# Patient Record
Sex: Male | Born: 1965 | Race: White | Hispanic: No | Marital: Single | Smoking: Current every day smoker
Health system: Southern US, Community
[De-identification: ages and names within clinical notes are randomized; demographics above are authoritative.]

## PROBLEM LIST (undated history)

## (undated) DIAGNOSIS — K859 Acute pancreatitis without necrosis or infection, unspecified: Secondary | ICD-10-CM

## (undated) DIAGNOSIS — I10 Essential (primary) hypertension: Secondary | ICD-10-CM

## (undated) DIAGNOSIS — F1010 Alcohol abuse, uncomplicated: Secondary | ICD-10-CM

## (undated) HISTORY — PX: NASAL SEPTUM SURGERY: SHX37

---

## 2019-10-10 ENCOUNTER — Other Ambulatory Visit: Payer: Self-pay

## 2019-10-10 ENCOUNTER — Encounter (HOSPITAL_COMMUNITY): Payer: Self-pay

## 2019-10-10 ENCOUNTER — Emergency Department (HOSPITAL_COMMUNITY)
Admission: EM | Admit: 2019-10-10 | Discharge: 2019-10-11 | Disposition: A | Discharge status: Home / Self Care | Payer: 59 | Attending: Emergency Medicine | Admitting: Emergency Medicine

## 2019-10-10 DIAGNOSIS — Z20822 Contact with and (suspected) exposure to covid-19: Secondary | ICD-10-CM | POA: Insufficient documentation

## 2019-10-10 DIAGNOSIS — F332 Major depressive disorder, recurrent severe without psychotic features: Secondary | ICD-10-CM | POA: Diagnosis not present

## 2019-10-10 DIAGNOSIS — F17210 Nicotine dependence, cigarettes, uncomplicated: Secondary | ICD-10-CM | POA: Diagnosis not present

## 2019-10-10 DIAGNOSIS — F1020 Alcohol dependence, uncomplicated: Secondary | ICD-10-CM | POA: Diagnosis present

## 2019-10-10 DIAGNOSIS — F1010 Alcohol abuse, uncomplicated: Secondary | ICD-10-CM

## 2019-10-10 NOTE — ED Provider Notes (Signed)
[place] DEPT Provider Note   CSN: [id number] Arrival date & time: 10/10/19  2123     History Chief Complaint  Patient presents with  . Wants Detox    Nicolas [name] is a 54 y.o. male.  The history is provided by the patient and medical records.    54 y.o. M with hx of alcohol abuse, presenting to the ED requesting detox.  Patient reports he is a former alcoholic, went through detox a few years ago and was doing well but relapsed on New Years Eve 2020.  States he has been drinking daily since that time, progressively worsening to the point where he is requiring more to drink on a daily basis.  States he absolutely cannot get back to the point that he was in before.  He reports where he is living is not a positive environment-- roommate is very controlling and also drinks quite a bit which is not a good influence for him.  He plans to move out in 1 month but states "a lot can happen in that time frame".  He is still attending [place] meetings but feels a lot of guilt for lying to his sponsor about his sobriety.  History reviewed. No pertinent past medical history.  There are no problems to display for this patient.   History reviewed. No pertinent surgical history.     No family history on file.  Social History   Tobacco Use  . Smoking status: Current Every Day Smoker    Packs/day: 0.50    Types: Cigarettes  . Smokeless tobacco: Never Used  Substance Use Topics  . Alcohol use: Yes    Alcohol/week: 6.0 standard drinks    Types: 6 Cans of beer per week  . Drug use: Not Currently    Types: Cocaine    Comment: stopped 01/2019    Home Medications Prior to Admission medications   Not on File    Allergies    Caffeine  Review of Systems   Review of Systems  Psychiatric/Behavioral:       Wants detox  All other systems reviewed and are negative.   Physical Exam Updated Vital Signs BP (!) 149/91 (BP Location: Right Arm)   Pulse 95    Temp 98.3 F (36.8 C) (Oral)   Resp 16   Ht 5\' 9"  (1.753 m)   Wt 80.7 kg   SpO2 96%   BMI 26.29 kg/m   Physical Exam Vitals and nursing note reviewed.  Constitutional:      Appearance: Nicolas [name] is well-developed.  HENT:     Head: Normocephalic and atraumatic.  Eyes:     Conjunctiva/sclera: Conjunctivae normal.     Pupils: Pupils are equal, round, and reactive to light.  Cardiovascular:     Rate and Rhythm: Normal rate and regular rhythm.     Heart sounds: Normal heart sounds.  Pulmonary:     Effort: Pulmonary effort is normal.     Breath sounds: Normal breath sounds.  Abdominal:     General: Bowel sounds are normal.     Palpations: Abdomen is soft.  Musculoskeletal:        General: Normal range of motion.     Cervical back: Normal range of motion.  Skin:    General: Skin is warm and dry.  Neurological:     Mental Status: Nicolas [name] is alert and oriented to person, place, and time.  Psychiatric:     Comments: Somewhat of an odd affect  ED Results / Procedures / Treatments   Labs (all labs ordered are listed, but only abnormal results are displayed) Labs Reviewed  CBC WITH DIFFERENTIAL/PLATELET - Abnormal; Notable for the following components:      Result Value   MCH 35.2 (*)    All other components within normal limits  COMPREHENSIVE METABOLIC PANEL - Abnormal; Notable for the following components:   Glucose, Bld 103 (*)    Calcium 8.7 (*)    AST 45 (*)    All other components within normal limits  ETHANOL - Abnormal; Notable for the following components:   Alcohol, Ethyl (B) 148 (*)    All other components within normal limits  SALICYLATE LEVEL - Abnormal; Notable for the following components:   Salicylate Lvl <7.0 (*)    All other components within normal limits  ACETAMINOPHEN LEVEL - Abnormal; Notable for the following components:   Acetaminophen (Tylenol), Serum <10 (*)    All other components within normal limits  RESPIRATORY PANEL BY RT PCR  (FLU A&B, COVID)  RAPID URINE DRUG SCREEN, HOSP PERFORMED    EKG None  Radiology No results found.  Procedures Procedures (including critical care time)  Medications Ordered in ED Medications - No data to display  ED Course  I have reviewed the triage vital signs and the nursing notes.  Pertinent labs & imaging results that were available during my care of the patient were reviewed by me and considered in my medical decision making (see chart for details).    MDM Rules/Calculators/A&P  11:36 PM Patient seen and evaluated.  Has history of alcohol abuse, progressively worsening drinking since New Year's Eve 2020.  States he is now at the point where he knows he needs help.  He is still attending AA and has some guilt from lying to his sponsor about his sobriety.  He denies any physical complaints currently.  Initially denied SI/HI/AVH.  After talking with patient and explaining that we do not offer IP detox here and that he would need to set this up as an OP, he got up from stretcher and stated "I swear to God, I have a knife in my backpack and I will slit my throat in the middle of this hallway if you send me away".  security was notified, belongings removed from patient and he will be wanded.  Labs ordered, will get psychiatric evaluation.  Labs reassuring-- ethanol 148.  Medically cleared.  Will get TTS consult.  1:52 AM TTS has evaluated-- recommended for overnight observation.  Can go to [facility] obs unit once COVID screen is negative.    covid screen negative.  Patient accepted to [facility] obs unit under care of Dr. Lucianne Muss.  Transport will be here to pick him up at approx 0630.  Final Clinical Impression(s) / ED Diagnoses Final diagnoses:  Alcohol abuse    Rx / DC Orders ED Discharge Orders    None       Garlon Hatchet, PA-C 10/11/19 0531    Glynn Octave, MD 10/11/19 [phone]

## 2019-10-10 NOTE — ED Notes (Signed)
Pt does not want blood drawn. Waiting to see if he had options

## 2019-10-10 NOTE — ED Triage Notes (Addendum)
Pt requests detox from alcohol. Stopped from October to January and has drank since. Pt states amount is increasing and he wants help before it gets bad again. Sts he drank a 6 pack today.

## 2019-10-11 ENCOUNTER — Other Ambulatory Visit: Payer: Self-pay

## 2019-10-11 ENCOUNTER — Ambulatory Visit (HOSPITAL_COMMUNITY): Payer: Self-pay

## 2019-10-11 ENCOUNTER — Observation Stay (HOSPITAL_COMMUNITY)
Admission: AD | Admit: 2019-10-11 | Discharge: 2019-10-11 | Disposition: A | Discharge status: Home / Self Care | Payer: 59 | Source: Intra-hospital | Attending: Psychiatry | Admitting: Psychiatry

## 2019-10-11 ENCOUNTER — Encounter (HOSPITAL_COMMUNITY): Payer: Self-pay | Admitting: Behavioral Health

## 2019-10-11 DIAGNOSIS — F329 Major depressive disorder, single episode, unspecified: Secondary | ICD-10-CM | POA: Diagnosis present

## 2019-10-11 LAB — ACETAMINOPHEN LEVEL: Acetaminophen (Tylenol), Serum: <10 ug/mL — ABNORMAL LOW (ref 10–30)

## 2019-10-11 LAB — RAPID URINE DRUG SCREEN, HOSP PERFORMED
Amphetamines: NOT DETECTED
Barbiturates: NOT DETECTED
Benzodiazepines: NOT DETECTED
Cocaine: NOT DETECTED
Opiates: NOT DETECTED
Tetrahydrocannabinol: NOT DETECTED

## 2019-10-11 LAB — COMPREHENSIVE METABOLIC PANEL
ALT: 31 U/L (ref 0–44)
AST: 45 U/L — ABNORMAL HIGH (ref 15–41)
Albumin: 4.5 g/dL (ref 3.5–5.0)
Alkaline Phosphatase: 74 U/L (ref 38–126)
Anion gap: 8 (ref 5–15)
BUN: 15 mg/dL (ref 6–20)
CO2: 26 mmol/L (ref 22–32)
Calcium: 8.7 mg/dL — ABNORMAL LOW (ref 8.9–10.3)
Chloride: 101 mmol/L (ref 98–111)
Creatinine, Ser: 0.86 mg/dL (ref 0.61–1.24)
GFR calc Af Amer: >60 mL/min (ref >60)
GFR calc non Af Amer: >60 mL/min (ref >60)
Glucose, Bld: 103 mg/dL — ABNORMAL HIGH (ref 70–99)
Potassium: 3.8 mmol/L (ref 3.5–5.1)
Sodium: 135 mmol/L (ref 135–145)
Total Bilirubin: 0.4 mg/dL (ref 0.3–1.2)
Total Protein: 7.1 g/dL (ref 6.5–8.1)

## 2019-10-11 LAB — CBC WITH DIFFERENTIAL/PLATELET
Abs Immature Granulocytes: 0.06 10*3/uL (ref 0.00–0.07)
Basophils Absolute: 0.1 10*3/uL (ref 0.0–0.1)
Basophils Relative: 1 %
Eosinophils Absolute: 0.2 10*3/uL (ref 0.0–0.5)
Eosinophils Relative: 3 %
HCT: 41.4 % (ref 39.0–52.0)
Hemoglobin: 14.9 g/dL (ref 13.0–17.0)
Immature Granulocytes: 1 %
Lymphocytes Relative: 43 %
Lymphs Abs: 3.4 10*3/uL (ref 0.7–4.0)
MCH: 35.2 pg — ABNORMAL HIGH (ref 26.0–34.0)
MCHC: 36 g/dL (ref 30.0–36.0)
MCV: 97.9 fL (ref 80.0–100.0)
Monocytes Absolute: 0.6 10*3/uL (ref 0.1–1.0)
Monocytes Relative: 8 %
Neutro Abs: 3.5 10*3/uL (ref 1.7–7.7)
Neutrophils Relative %: 44 %
Platelets: 253 10*3/uL (ref 150–400)
RBC: 4.23 MIL/uL (ref 4.22–5.81)
RDW: 11.9 % (ref 11.5–15.5)
WBC: 7.8 10*3/uL (ref 4.0–10.5)
nRBC: 0 % (ref 0.0–0.2)

## 2019-10-11 LAB — RESPIRATORY PANEL BY RT PCR (FLU A&B, COVID)
Influenza A by PCR: NEGATIVE
Influenza B by PCR: NEGATIVE
SARS Coronavirus 2 by RT PCR: NEGATIVE

## 2019-10-11 LAB — ETHANOL: Alcohol, Ethyl (B): 148 mg/dL — ABNORMAL HIGH (ref <10)

## 2019-10-11 LAB — SALICYLATE LEVEL: Salicylate Lvl: <7 mg/dL — ABNORMAL LOW (ref 7.0–30.0)

## 2019-10-11 MED ORDER — ACETAMINOPHEN 325 MG PO TABS: 650.0000 mg | ORAL_TABLET | Freq: Four times a day (QID) | ORAL | Status: DC | PRN

## 2019-10-11 MED ORDER — LORAZEPAM 1 MG PO TABS
1.0000 mg | ORAL_TABLET | Freq: Once | ORAL | Status: AC
  Administered 2019-10-11: 02:00:00 1 mg via ORAL
  Filled 2019-10-11: qty 1

## 2019-10-11 NOTE — ED Notes (Signed)
Safe transport contacted to transfer pt to [facility]: ETA 0630

## 2019-10-11 NOTE — Patient Outreach (Signed)
[facility] met with Pt an was able to gain information to better assist Pt. [facility] was able to complete series of questions to better assist Pt. Pt stated that he was doing much better because of the fact that he did not want to harm himself at the time. [facility] addressed the fact there are various option to better the quality of his life. Pt stated that he just wanted out Patient services. CPSS left contact information for Pt to contact [facility] if needed in community an to assist Pt with services.

## 2019-10-11 NOTE — Progress Notes (Signed)
Per [facility] pt tentatively accepted to [id number] pending negative covid. Call to report [phone]. Attending provider will be Dr. Lucianne Muss, MD.

## 2019-10-11 NOTE — [facility] Suicide Risk Assessment (Cosign Needed)
Suicide Risk Assessment  Discharge Assessment   [facility] Discharge Suicide Risk Assessment   Principal Problem: MDD (major depressive disorder) Discharge Diagnoses: Principal Problem:   MDD (major depressive disorder)  Per [facility] Progress Notes dated 10/11/2019:  Patient seen with Dr. Jama Flavors. Chart reviewed. Nicolas [name] is a 54 year old with history of alcohol use disorder who presented to [facility] yesterday requesting detox. He had been sober for 90 days but relapsed on New Years and has been drinking 4-8 beers almost daily since that time. He reports stressors of a bad living situation with a roommate who also drinks. He will be moving at the end of May but states he is struggling to get his drinking under control. He has been attending AA but states he has been lying to his sponsor about his drinking. He expresses fear that he will regress to his former levels of drinking when he was drinking a fifth per day. He reports withdrawal symptoms of insomnia, nausea, diarrhea, and chills. He denies history of severe withdrawal symptoms, seizures, or DTs. Denies medical problems. He denies SI/HI/AVH. He admits to history of SI when drinking but states, "I would never do that. I just feel bad when I drink." He is future-oriented and states he plans to sign the lease for his new apartment tomorrow. He shows no signs of paranoia or of responding to internal stimuli. No delusional thought content expressed. He is oriented x3. He is requesting detox and help for his alcohol use disorder.  From admission assessment: Nicolas [name] an 54 y.o.malewho presents to the ED voluntarily.Pt states he is depressed and feels helpless. Pt reports he has been living in a toxic environment and relapsed on alcohol 2 months ago. Pt states he moved to [place] from [place] because a friend told him that he could stay with her. Pt states ever since he moved in with her, she has been controlling. Pt states this causes him to want to drink and he  reports he had thoughts of buying a 5th of alcohol tonight in order to deal with her and the home.   Total Time spent with patient: 30 minutes  Musculoskeletal: Strength & Muscle Tone: within normal limits Gait & Station: normal Patient leans: N/A  Psychiatric Specialty Exam: Per [facility] Progress Note completed 10/11/2019 Physical Exam  Nursing note and vitals reviewed. Constitutional: Nicolas [name] is oriented to person, place, and time. Nicolas [name] appears well-developed and well-nourished.  Cardiovascular: Normal rate.  Respiratory: Effort normal.  Neurological: Nicolas [name] is alert and oriented to person, place, and time.    Review of Systems  Constitutional: Negative.   Respiratory: Negative for cough and shortness of breath.   Gastrointestinal: Positive for diarrhea and nausea. Negative for vomiting.  Neurological: Negative for tremors, seizures and headaches.  Psychiatric/Behavioral: Positive for dysphoric mood and sleep disturbance. Negative for agitation, behavioral problems, confusion, hallucinations, self-injury and suicidal ideas. The patient is not nervous/anxious and is not hyperactive.     Blood pressure 121/86, pulse 85, temperature 97.9 F (36.6 C), temperature source Oral, resp. rate 18, height 5\' 9"  (1.753 m), weight 85 kg, SpO2 96 %.Body mass index is 27.69 kg/m.  General Appearance: Disheveled  Eye Contact:  Fair  Speech:  Normal Rate  Volume:  Normal  Mood:  Dysphoric  Affect:  Congruent  Thought Process:  Coherent  Orientation:  Full (Time, Place, and Person)  Thought Content:  Logical  Suicidal Thoughts:  No  Homicidal Thoughts:  No  Memory:  Immediate;  Fair Recent;   Fair Remote;   Fair  Judgement:  Intact  Insight:  Fair  Psychomotor Activity:  Normal  Concentration:  Concentration: Fair and Attention Span: Fair  Recall:  [employer] of Knowledge:  Fair  Language:  Good  Akathisia:  No  Handed:  Right  AIMS (if indicated):     Assets:   Communication Skills Desire for Improvement Financial Resources/Insurance Housing Resilience Social Support  ADL's:  Intact  Cognition:  WNL  Sleep:        On Admission:  NA  Demographic Factors:  Male  Loss Factors: NA  Historical Factors: Impulsivity  Risk Reduction Factors:   Living with another person, especially a relative and Positive social support  Continued Clinical Symptoms:  Depression:   Impulsivity Alcohol/Substance Abuse/Dependencies  Cognitive Features That Contribute To Risk:  Closed-mindedness    Suicide Risk:  Mild:  Suicidal ideation of limited frequency, intensity, duration, and specificity.  There are no identifiable plans, no associated intent, mild dysphoria and related symptoms, good self-control (both objective and subjective assessment), few other risk factors, and identifiable protective factors, including available and accessible social support.    Plan Of Care/Follow-up recommendations:  Discussed with Dr. Parke Poisson. Patient poses no acute risk of harm to self or others and does not meet criteria for psychiatric inpatient hospitalization. Recommend referrals for detox and alcohol use disorder treatment.   Mallie Darting, NP 10/11/2019, 3:24 PM

## 2019-10-11 NOTE — Progress Notes (Signed)
[facility] MD Progress Note  10/11/2019 9:15 AM Nicolas [name]  MRN:  [id number] Subjective:  "I started to drink and I couldn't stop."  Patient seen with Dr. Jama Flavors. Chart reviewed. Mr. Nicolas [name] is a 54 year old with history of alcohol use disorder who presented to [facility] yesterday requesting detox. He had been sober for 90 days but relapsed on New Years and has been drinking 4-8 beers almost daily since that time. He reports stressors of a bad living situation with a roommate who also drinks. He will be moving at the end of May but states he is struggling to get his drinking under control. He has been attending AA but states he has been lying to his sponsor about his drinking. He expresses fear that he will regress to his former levels of drinking when he was drinking a fifth per day. He reports withdrawal symptoms of insomnia, nausea, diarrhea, and chills. He denies history of severe withdrawal symptoms, seizures, or DTs. Denies medical problems. He denies SI/HI/AVH. He admits to history of SI when drinking but states, "I would never do that. I just feel bad when I drink." He is future-oriented and states he plans to sign the lease for his new apartment tomorrow. He shows no signs of paranoia or of responding to internal stimuli. No delusional thought content expressed. He is oriented x3. He is requesting detox and help for his alcohol use disorder.  From admission assessment: Nicolas [name] is an 54 y.o. male who presents to the ED voluntarily. Pt states he is depressed and feels helpless. Pt reports he has been living in a toxic environment and relapsed on alcohol 2 months ago. Pt states he moved to [place] from [place] because a friend told him that he could stay with her. Pt states ever since he moved in with her, she has been controlling. Pt states this causes him to want to drink and he reports he had thoughts of buying a 5th of alcohol tonight in order to deal with her and the home.  Principal Problem: <principal  problem not specified> Diagnosis: Active Problems:   MDD (major depressive disorder)  Total Time spent with patient: 20 minutes  Past Psychiatric History: Alcohol use disorder.  Past Medical History: History reviewed. No pertinent past medical history. History reviewed. No pertinent surgical history. Family History: History reviewed. No pertinent family history. Family Psychiatric  History: Denies Social History:  Social History   Substance and Sexual Activity  Alcohol Use Yes  . Alcohol/week: 6.0 standard drinks  . Types: 6 Cans of beer per week     Social History   Substance and Sexual Activity  Drug Use Not Currently  . Types: Cocaine   Comment: stopped 01/2019    Social History   Socioeconomic History  . Marital status: Single    Spouse name: Not on file  . Number of children: Not on file  . Years of education: Not on file  . Highest education level: Not on file  Occupational History  . Not on file  Tobacco Use  . Smoking status: Current Every Day Smoker    Packs/day: 0.50    Types: Cigarettes  . Smokeless tobacco: Never Used  Substance and Sexual Activity  . Alcohol use: Yes    Alcohol/week: 6.0 standard drinks    Types: 6 Cans of beer per week  . Drug use: Not Currently    Types: Cocaine    Comment: stopped 01/2019  . Sexual activity: Not on file  Other Topics Concern  .  Not on file  Social History Narrative  . Not on file   Social Determinants of Health   Financial Resource Strain:   . Difficulty of Paying Living Expenses:   Food Insecurity:   . Worried About Programme researcher, broadcasting/film/videounning Out of Food in the Last Year:   . Baristaan Out of Food in the Last Year:   Transportation Needs:   . Freight forwarderLack of Transportation (Medical):   Marland Kitchen. Lack of Transportation (Non-Medical):   Physical Activity:   . Days of Exercise per Week:   . Minutes of Exercise per Session:   Stress:   . Feeling of Stress :   Social Connections:   . Frequency of Communication with Friends and Family:   .  Frequency of Social Gatherings with Friends and Family:   . Attends Religious Services:   . Active Member of Clubs or Organizations:   . Attends BankerClub or Organization Meetings:   Marland Kitchen. Marital Status:    Additional Social History:                         Sleep: Poor  Appetite:  Fair  Current Medications: Current Facility-Administered Medications  Medication Dose Route Frequency Provider Last Rate Last Admin  . acetaminophen (TYLENOL) tablet 650 mg  650 mg Oral Q6H PRN Anike, Adaku C, NP        Lab Results:  Results for orders placed or performed during the hospital encounter of 10/10/19 (from the past 48 hour(s))  CBC with Differential     Status: Abnormal   Collection Time: 10/10/19 11:39 PM  Result Value Ref Range   WBC 7.8 4.0 - 10.5 K/uL   RBC 4.23 4.22 - 5.81 MIL/uL   Hemoglobin 14.9 13.0 - 17.0 g/dL   HCT 16.141.4 09.639.0 - 04.552.0 %   MCV 97.9 80.0 - 100.0 fL   MCH 35.2 (H) 26.0 - 34.0 pg   MCHC 36.0 30.0 - 36.0 g/dL   RDW 40.911.9 81.111.5 - 91.415.5 %   Platelets 253 150 - 400 K/uL   nRBC 0.0 0.0 - 0.2 %   Neutrophils Relative % 44 %   Neutro Abs 3.5 1.7 - 7.7 K/uL   Lymphocytes Relative 43 %   Lymphs Abs 3.4 0.7 - 4.0 K/uL   Monocytes Relative 8 %   Monocytes Absolute 0.6 0.1 - 1.0 K/uL   Eosinophils Relative 3 %   Eosinophils Absolute 0.2 0.0 - 0.5 K/uL   Basophils Relative 1 %   Basophils Absolute 0.1 0.0 - 0.1 K/uL   Immature Granulocytes 1 %   Abs Immature Granulocytes 0.06 0.00 - 0.07 K/uL    Comment: Performed at [facility], [address] [place], [place] [id number]  Comprehensive metabolic panel     Status: Abnormal   Collection Time: 10/10/19 11:39 PM  Result Value Ref Range   Sodium 135 135 - 145 mmol/L   Potassium 3.8 3.5 - 5.1 mmol/L   Chloride 101 98 - 111 mmol/L   CO2 26 22 - 32 mmol/L   Glucose, Bld 103 (H) 70 - 99 mg/dL    Comment: Glucose reference range applies only to samples taken after fasting for at least 8 hours.   BUN 15 6  - 20 mg/dL   Creatinine, Ser 6.210.86 0.61 - 1.24 mg/dL   Calcium 8.7 (L) 8.9 - 10.3 mg/dL   Total Protein 7.1 6.5 - 8.1 g/dL   Albumin 4.5 3.5 - 5.0 g/dL   AST 45 (  H) 15 - 41 U/L   ALT 31 0 - 44 U/L   Alkaline Phosphatase 74 38 - 126 U/L   Total Bilirubin 0.4 0.3 - 1.2 mg/dL   GFR calc non Af Amer >60 >60 mL/min   GFR calc Af Amer >60 >60 mL/min   Anion gap 8 5 - 15    Comment: Performed at [facility], [place] [address], [place], [place] [address]  Ethanol     Status: Abnormal   Collection Time: 10/10/19 11:39 PM  Result Value Ref Range   Alcohol, Ethyl (B) 148 (H) <10 mg/dL    Comment: (NOTE) Lowest detectable limit for serum alcohol is 10 mg/dL. For medical purposes only. Performed at [facility], [place] [address], [place], [place] [address]   Salicylate level     Status: Abnormal   Collection Time: 10/10/19 11:39 PM  Result Value Ref Range   Salicylate Lvl <7.8 (L) 7.0 - 30.0 mg/dL    Comment: Performed at [facility], [place] [address] [place], [place] [address]  Acetaminophen level     Status: Abnormal   Collection Time: 10/10/19 11:39 PM  Result Value Ref Range   Acetaminophen (Tylenol), Serum <10 (L) 10 - 30 ug/mL    Comment: (NOTE) Therapeutic concentrations vary significantly. A range of 10-30 ug/mL  may be an effective concentration for many patients. However, some  are best treated at concentrations outside of this range. Acetaminophen concentrations >150 ug/mL at 4 hours after ingestion  and >50 ug/mL at 12 hours after ingestion are often associated with  toxic reactions. Performed at [facility], [place] [address] [place], [place] [address]   Rapid urine drug screen (hospital performed)     Status: None   Collection Time: 10/11/19 12:13 AM  Result Value Ref Range   Opiates NONE DETECTED NONE DETECTED   Cocaine NONE DETECTED NONE DETECTED   Benzodiazepines NONE DETECTED NONE DETECTED    Amphetamines NONE DETECTED NONE DETECTED   Tetrahydrocannabinol NONE DETECTED NONE DETECTED   Barbiturates NONE DETECTED NONE DETECTED    Comment: (NOTE) DRUG SCREEN FOR MEDICAL PURPOSES ONLY.  IF CONFIRMATION IS NEEDED FOR ANY PURPOSE, NOTIFY LAB WITHIN 5 DAYS. LOWEST DETECTABLE LIMITS FOR URINE DRUG SCREEN Drug Class                     Cutoff (ng/mL) Amphetamine and metabolites    1000 Barbiturate and metabolites    200 Benzodiazepine                 132 Tricyclics and metabolites     300 Opiates and metabolites        300 Cocaine and metabolites        300 THC                            50 Performed at [facility], [place] [address] [place], [place] [address]   Respiratory Panel by RT PCR (Flu A&B, Covid) - Nasopharyngeal Swab     Status: None   Collection Time: 10/11/19  1:52 AM   Specimen: Nasopharyngeal Swab  Result Value Ref Range   SARS Coronavirus 2 by RT PCR NEGATIVE NEGATIVE    Comment: (NOTE) SARS-CoV-2 target nucleic acids are NOT DETECTED. The SARS-CoV-2 RNA is generally detectable in upper respiratoy specimens during the acute phase of infection. The lowest concentration of SARS-CoV-2 viral copies this  assay can detect is 131 copies/mL. A negative result does not preclude SARS-Cov-2 infection and should not be used as the sole basis for treatment or other patient management decisions. A negative result may occur with  improper specimen collection/handling, submission of specimen other than nasopharyngeal swab, presence of viral mutation(s) within the areas targeted by this assay, and inadequate number of viral copies (<131 copies/mL). A negative result must be combined with clinical observations, patient history, and epidemiological information. The expected result is Negative. Fact Sheet for Patients:  https://www.moore.com/ Fact Sheet for Healthcare Providers:  https://www.young.biz/ This test is not  yet ap proved or cleared by the [place] FDA and  has been authorized for detection and/or diagnosis of SARS-CoV-2 by FDA under an Emergency Use Authorization (EUA). This EUA will remain  in effect (meaning this test can be used) for the duration of the COVID-19 declaration under Section 564(b)(1) of the Act, 21 U.S.C. section 360bbb-3(b)(1), unless the authorization is terminated or revoked sooner.    Influenza A by PCR NEGATIVE NEGATIVE   Influenza B by PCR NEGATIVE NEGATIVE    Comment: (NOTE) The Xpert Xpress SARS-CoV-2/FLU/RSV assay is intended as an aid in  the diagnosis of influenza from Nasopharyngeal swab specimens and  should not be used as a sole basis for treatment. Nasal washings and  aspirates are unacceptable for Xpert Xpress SARS-CoV-2/FLU/RSV  testing. Fact Sheet for Patients: https://www.moore.com/ Fact Sheet for Healthcare Providers: https://www.young.biz/ This test is not yet approved or cleared by the [place] FDA and  has been authorized for detection and/or diagnosis of SARS-CoV-2 by  FDA under an Emergency Use Authorization (EUA). This EUA will remain  in effect (meaning this test can be used) for the duration of the  Covid-19 declaration under Section 564(b)(1) of the Act, 21  U.S.C. section 360bbb-3(b)(1), unless the authorization is  terminated or revoked. Performed at [facility], [address], [place], [place] [address]     Blood Alcohol level:  Lab Results  Component Value Date   ETH 148 (H) 10/10/2019    Metabolic Disorder Labs: No results found for: HGBA1C, MPG No results found for: PROLACTIN No results found for: CHOL, TRIG, HDL, CHOLHDL, VLDL, LDLCALC  Physical Findings: AIMS: Facial and Oral Movements Muscles of Facial Expression: None, normal Lips and Perioral Area: None, normal Jaw: None, normal Tongue: None, normal,Extremity Movements Upper (arms, wrists, hands,  fingers): None, normal Lower (legs, knees, ankles, toes): None, normal, Trunk Movements Neck, shoulders, hips: None, normal, Overall Severity Severity of abnormal movements (highest score from questions above): None, normal Incapacitation due to abnormal movements: None, normal Patient's awareness of abnormal movements (rate only patient's report): No Awareness, Dental Status Current problems with teeth and/or dentures?: No Does patient usually wear dentures?: No  CIWA:    COWS:     Psychiatric Specialty Exam: Physical Exam  Nursing note and vitals reviewed. Constitutional: Abdelrahman Nair is oriented to person, place, and time. Renell Allum appears well-developed and well-nourished.  Cardiovascular: Normal rate.  Respiratory: Effort normal.  Neurological: Maximillian Habibi is alert and oriented to person, place, and time.    Review of Systems  Constitutional: Negative.   Respiratory: Negative for cough and shortness of breath.   Gastrointestinal: Positive for diarrhea and nausea. Negative for vomiting.  Neurological: Negative for tremors, seizures and headaches.  Psychiatric/Behavioral: Positive for dysphoric mood and sleep disturbance. Negative for agitation, behavioral problems, confusion, hallucinations, self-injury and suicidal ideas. The patient is not nervous/anxious and is not  hyperactive.     Blood pressure 121/86, pulse 85, temperature 97.9 F (36.6 C), temperature source Oral, resp. rate 18, height 5\' 9"  (1.753 m), weight 85 kg, SpO2 96 %.Body mass index is 27.69 kg/m.  General Appearance: Disheveled  Eye Contact:  Fair  Speech:  Normal Rate  Volume:  Normal  Mood:  Dysphoric  Affect:  Congruent  Thought Process:  Coherent  Orientation:  Full (Time, Place, and Person)  Thought Content:  Logical  Suicidal Thoughts:  No  Homicidal Thoughts:  No  Memory:  Immediate;   Fair Recent;   Fair Remote;   Fair  Judgement:  Intact  Insight:  Fair  Psychomotor Activity:  Normal   Concentration:  Concentration: Fair and Attention Span: Fair  Recall:  of Knowledge:  Fair  Language:  Good  Akathisia:  No  Handed:  Right  AIMS (if indicated):     Assets:  Communication Skills Desire for Improvement Financial Resources/Insurance Housing Resilience Social Support  ADL's:  Intact  Cognition:  WNL  Sleep:        Disposition: Discussed with Dr. [employer]erv. Patient poses no acute risk of harm to self or others and does not meet criteria for psychiatric inpatient hospitalization. Recommend referrals for detox and alcohol use disorder treatment.  Jama Flavors, NP 10/11/2019, 9:15 AM

## 2019-10-11 NOTE — Progress Notes (Signed)
Patient discharged per MD order. Discharge instructions provided an patient verbalized understanding. Denied suicidal thoughts. Belongings returned to patient. No sign of distress upon discharge.

## 2019-10-11 NOTE — Discharge Instructions (Signed)
To help you maintain a sober lifestyle, a substance abuse treatment program may be beneficial to you.  Contact one of the following facilities at your earliest opportunity to ask about enrolling:  RESIDENTIAL PROGRAMS:       [facility]      [address]      [place], [place] [address]      [phone]       [facility] [place]      [address] [place] [address]      [phone]       [facility]      [address]      [place], [place] [address]      [phone]  CHEMICAL DEPENDENCY INTENSIVE OUTPATIENT PROGRAMS:       [facility]      [address]      [place], [place] [address]      [phone]       [facility]      [address]      [place],  [address]      [phone]  

## 2019-10-11 NOTE — BH Assessment (Signed)
Tele Assessment Note   Patient Name: Nicolas [name] MRN: [id number] Referring Physician: Garlon Hatchet, PA-C Location of Patient: [place] Location of Provider: Behavioral Health TTS Department  Nicolas [name] is an 54 y.o. male who presents to the ED voluntarily. Pt states he is depressed and feels helpless. Pt reports he has been living in a toxic environment and relapsed on alcohol 2 months ago. Pt states he moved to [place] from [place] because a friend told him that he could stay with her. Pt states ever since he moved in with her, she has been controlling. Pt states this causes him to want to drink and he reports he had thoughts of buying a 5th of alcohol tonight in order to deal with her and the home. Pt states this is when he knew he needed help and he went to [facility] to ask for help. Pt endorses feelings of hopelessness, depression, sad mood, difficulty sleeping, and irritability. Pt states he attends AA meetings daily, talks with his sponsors, and continues to feel depressed and consume large amounts of alcohol. Pt states he "hates life" and is sometimes "scared to flush the toilet at night because it's so loud." Pt states he is afraid because 2 friends from his group killed themselves and his sponsor continues to tell him that if he does not get help, he is going to die.   Pt is alert and oriented during the assessment. Pt is irritable and depressed. Pt does not appear to be responding to internal stimuli. Pt's judgement is partial. Pt's insight is fair. Pt's appetite is poor and reports he has loss of appetite and decreased sleep due to stress.  Per Renaye Rakers, NP pt is recommended for overnight OBS for safety and stabilization and to be reassessed in the AM by psych. EDP Garlon Hatchet, PA-C and [place], [place], RN have been advised. Pt has an OBS bed at [facility] pending negative Covid  Diagnosis: MDD, recurrent, severe, w/o psychosis; Alcohol use d/o, severe  Past Medical History: History reviewed. No  pertinent past medical history.  History reviewed. No pertinent surgical history.  Family History: No family history on file.  Social History:  reports that he has been smoking cigarettes. He has been smoking about 0.50 packs per day. He has never used smokeless tobacco. He reports current alcohol use of about 6.0 standard drinks of alcohol per week. He reports previous drug use. Drug: Cocaine.  Additional Social History:  Alcohol / Drug Use Pain Medications: See MAR Prescriptions: See MAR Over the Counter: See MAR History of alcohol / drug use?: Yes Longest period of sobriety (when/how long): 3 months Negative Consequences of Use: Personal relationships Withdrawal Symptoms: Irritability, Sweats Substance #1 Name of Substance 1: Alcohol 1 - Age of First Use: teens 1 - Amount (size/oz): excessive 1 - Frequency: daily 1 - Duration: ongoing 1 - Last Use / Amount: 10/10/2019  CIWA: CIWA-Ar BP: (!) 149/91 Pulse Rate: 95 COWS:    Allergies:  Allergies  Allergen Reactions  . Caffeine Palpitations    Home Medications: (Not in a hospital admission)   OB/GYN Status:  No LMP for male patient.  General Assessment Data Location of Assessment: [facility] TTS Assessment: In system Is this a Tele or Face-to-Face Assessment?: Tele Assessment Is this an Initial Assessment or a Re-assessment for this encounter?: Initial Assessment Patient Accompanied by:: N/A Language Other than English: No Living Arrangements: Other (Comment) What gender do you identify as?: Male Marital status: Single Pregnancy Status: No Living  Arrangements: Non-relatives/Friends Can pt return to current living arrangement?: (pt does not wish to return) Admission Status: Voluntary Is patient capable of signing voluntary admission?: Yes Referral Source: Self/Family/Friend Insurance type: BRIGHT HEALTH / BRIGHT HEALTH     Crisis Care Plan Living Arrangements: Non-relatives/Friends Name of Psychiatrist:  none Name of Therapist: AA meetings  Education Status Is patient currently in school?: No Is the patient employed, unemployed or receiving disability?: Unemployed  Risk to self with the past 6 months Suicidal Ideation: Yes-Currently Present Has patient been a risk to self within the past 6 months prior to admission? : No Suicidal Intent: No Has patient had any suicidal intent within the past 6 months prior to admission? : No Is patient at risk for suicide?: Yes Suicidal Plan?: No Has patient had any suicidal plan within the past 6 months prior to admission? : No Access to Means: No What has been your use of drugs/alcohol within the last 12 months?: alcohol Previous Attempts/Gestures: No Other Self Harm Risks: substance abuse, depression Triggers for Past Attempts: None known Intentional Self Injurious Behavior: None Family Suicide History: No Recent stressful life event(s): Conflict (Comment), Other (Comment)(conflict with living situation, relapsed on alcohol) Persecutory voices/beliefs?: No Depression: Yes Depression Symptoms: Despondent, Insomnia, Loss of interest in usual pleasures, Feeling worthless/self pity, Feeling angry/irritable Substance abuse history and/or treatment for substance abuse?: Yes Suicide prevention information given to non-admitted patients: Not applicable  Risk to Others within the past 6 months Homicidal Ideation: No Does patient have any lifetime risk of violence toward others beyond the six months prior to admission? : No Thoughts of Harm to Others: No Current Homicidal Intent: No Current Homicidal Plan: No Access to Homicidal Means: No History of harm to others?: No Assessment of Violence: None Noted Does patient have access to weapons?: No Criminal Charges Pending?: No Does patient have a court date: No Is patient on probation?: No  Psychosis Hallucinations: None noted Delusions: None noted  Mental Status Report Appearance/Hygiene:  Unremarkable Eye Contact: Good Motor Activity: Freedom of movement Speech: Logical/coherent Level of Consciousness: Alert, Irritable Mood: Depressed, Helpless, Anxious Affect: Depressed, Sad, Anxious Anxiety Level: Severe Thought Processes: Relevant, Coherent Judgement: Partial Orientation: Person, Place, Time, Situation, Appropriate for developmental age Obsessive Compulsive Thoughts/Behaviors: None  Cognitive Functioning Concentration: Normal Memory: Remote Intact, Recent Intact Is patient IDD: No Insight: Fair Impulse Control: Poor Appetite: Fair Have you had any weight changes? : No Change Sleep: Decreased Total Hours of Sleep: 4 Vegetative Symptoms: None  ADLScreening [facility] Dt Assessment Services) Patient's cognitive ability adequate to safely complete daily activities?: Yes Patient able to express need for assistance with ADLs?: Yes Independently performs ADLs?: Yes (appropriate for developmental age)  Prior Inpatient Therapy Prior Inpatient Therapy: Yes Prior Therapy Dates: 2020 Prior Therapy Facilty/Provider(s): [facility] Reason for Treatment: substance abuse tx  Prior Outpatient Therapy Prior Outpatient Therapy: Yes Prior Therapy Dates: ongoing Prior Therapy Facilty/Provider(s): [facility] Reason for Treatment: substance abuse tx Does patient have an ACCT team?: No Does patient have Intensive In-House Services?  : No Does patient have [facility] services? : No Does patient have P4CC services?: No  ADL Screening (condition at time of admission) Patient's cognitive ability adequate to safely complete daily activities?: Yes Is the patient deaf or have difficulty hearing?: No Does the patient have difficulty seeing, even when wearing glasses/contacts?: No Does the patient have difficulty concentrating, remembering, or making decisions?: No Patient able to express need for assistance with ADLs?: Yes Does the patient have difficulty  dressing or  bathing?: No Independently performs ADLs?: Yes (appropriate for developmental age) Does the patient have difficulty walking or climbing stairs?: No Weakness of Legs: None Weakness of Arms/Hands: None  Home Assistive Devices/Equipment Home Assistive Devices/Equipment: None    Abuse/Neglect Assessment (Assessment to be complete while patient is alone) Abuse/Neglect Assessment Can Be Completed: Unable to assess, patient is non-responsive or altered mental status(pt states he does not want to talk about it)     Advance Directives (For Healthcare) Does Patient Have a Medical Advance Directive?: No Would patient like information on creating a medical advance directive?: No - Patient declined         Per Talbot Grumbling, NP pt is recommended for overnight OBS for safety and stabilization and to be reassessed in the AM by psych. EDP Larene Pickett, PA-C and [employer] [facility] [employer], [place], RN have been advised. Pt has an OBS bed at [facility] pending negative Covid Disposition:  Disposition Initial Assessment Completed for this Encounter: Yes Disposition of Patient: (overnight obs) Patient refused recommended treatment: No  This service was provided via telemedicine using a 2-way, interactive audio and video technology.  Names of all persons participating in this telemedicine service and their role in this encounter. Name: Nicolas [name] Role: Patient  Name: Lind Covert, LCSW Role: TTS  Name: Talbot Grumbling, NP Role: [facility] Provider       Lyanne Co 10/11/2019 2:45 AM

## 2019-10-11 NOTE — BH Assessment (Signed)
[facility] Assessment Progress Note  Per Fernando Cobos, MD, this pt does not require psychiatric hospitalization at this time.  Pt is to be discharged from the [facility] Observation Unit with referral information for area substance abuse treatment providers.  This has been included in pt's discharge instructions.  Pt would also benefit from seeing Peer Support Specialists, and a peer support consult has been ordered for pt.  Pt's nurses, [name] and Veronique, have been notified.  Gayathri Futrell, MA Triage Specialist [phone]     

## 2019-10-11 NOTE — Progress Notes (Signed)
Per Renaye Rakers, NP pt is recommended for overnight OBS for safety and stabilization and to be reassessed in the AM by psych. EDP Garlon Hatchet, PA-C and [employer], [place], RN have been advised. Pt has an OBS bed at [facility] pending negative Covid

## 2019-10-11 NOTE — Plan of Care (Signed)
[facility] Observation Crisis Plan  Reason for Crisis Plan:  Crisis Stabilization and Substance Abuse   Plan of Care:  Referral for Substance Abuse  Family Support:      Current Living Environment:  Living Arrangements: Non-relatives/Friends  Insurance:   Hospital Account    Name Acct ID Class Status Primary Coverage   Nicolas [name], Nicolas [name] [id number] BEHAVIORAL HEALTH OBSERVATION Open BRIGHT HEALTH  - BRIGHT HEALTH        Guarantor Account (for Hospital Account [id number])    Name Relation to Pt Service Area Active? Acct Type   Nicolas [name] Self [facility] Yes Behavioral Health   Address Phone       [address] [place], [place] [address] [phone](H)          Coverage Information (for Hospital Account [id number])    F/O Payor/Plan Precert #   [facility]    Subscriber Subscriber #   Nicolas, [name] [id number]   Address Phone   [address] [address] [place], [place] [address]       Legal Guardian:     Primary Care Provider:  Clayborn Heron, MD  Current Outpatient Providers:  None  Psychiatrist:     Counselor/Therapist:     Compliant with Medications:  Yes  Additional Information:   Nicolas [name] 4/30/20217:38 AM

## 2019-10-15 ENCOUNTER — Emergency Department (HOSPITAL_COMMUNITY)
Admission: EM | Admit: 2019-10-15 | Discharge: 2019-10-16 | Disposition: A | Discharge status: Home / Self Care | Payer: 59 | Attending: Emergency Medicine | Admitting: Emergency Medicine

## 2019-10-15 ENCOUNTER — Other Ambulatory Visit: Payer: Self-pay

## 2019-10-15 ENCOUNTER — Encounter (HOSPITAL_COMMUNITY): Payer: Self-pay | Admitting: Emergency Medicine

## 2019-10-15 DIAGNOSIS — Z79899 Other long term (current) drug therapy: Secondary | ICD-10-CM | POA: Insufficient documentation

## 2019-10-15 DIAGNOSIS — F1010 Alcohol abuse, uncomplicated: Secondary | ICD-10-CM

## 2019-10-15 DIAGNOSIS — F1024 Alcohol dependence with alcohol-induced mood disorder: Secondary | ICD-10-CM | POA: Insufficient documentation

## 2019-10-15 DIAGNOSIS — F1020 Alcohol dependence, uncomplicated: Secondary | ICD-10-CM | POA: Diagnosis present

## 2019-10-15 DIAGNOSIS — F329 Major depressive disorder, single episode, unspecified: Secondary | ICD-10-CM

## 2019-10-15 DIAGNOSIS — R142 Eructation: Secondary | ICD-10-CM | POA: Insufficient documentation

## 2019-10-15 DIAGNOSIS — F17210 Nicotine dependence, cigarettes, uncomplicated: Secondary | ICD-10-CM | POA: Insufficient documentation

## 2019-10-15 DIAGNOSIS — F10229 Alcohol dependence with intoxication, unspecified: Secondary | ICD-10-CM | POA: Diagnosis present

## 2019-10-15 DIAGNOSIS — R109 Unspecified abdominal pain: Secondary | ICD-10-CM | POA: Diagnosis present

## 2019-10-15 DIAGNOSIS — F32A Depression, unspecified: Secondary | ICD-10-CM

## 2019-10-15 LAB — CBC
HCT: 41.7 % (ref 39.0–52.0)
Hemoglobin: 14.5 g/dL (ref 13.0–17.0)
MCH: 34 pg (ref 26.0–34.0)
MCHC: 34.8 g/dL (ref 30.0–36.0)
MCV: 97.9 fL (ref 80.0–100.0)
Platelets: 240 10*3/uL (ref 150–400)
RBC: 4.26 MIL/uL (ref 4.22–5.81)
RDW: 12 % (ref 11.5–15.5)
WBC: 6.1 10*3/uL (ref 4.0–10.5)
nRBC: 0 % (ref 0.0–0.2)

## 2019-10-15 LAB — URINALYSIS, ROUTINE W REFLEX MICROSCOPIC
Bilirubin Urine: NEGATIVE
Glucose, UA: NEGATIVE mg/dL
Hgb urine dipstick: NEGATIVE
Ketones, ur: NEGATIVE mg/dL
Leukocytes,Ua: NEGATIVE
Nitrite: NEGATIVE
Protein, ur: NEGATIVE mg/dL
Specific Gravity, Urine: 1.002 — ABNORMAL LOW (ref 1.005–1.030)
pH: 7 (ref 5.0–8.0)

## 2019-10-15 LAB — COMPREHENSIVE METABOLIC PANEL
ALT: 42 U/L (ref 0–44)
AST: 52 U/L — ABNORMAL HIGH (ref 15–41)
Albumin: 4.1 g/dL (ref 3.5–5.0)
Alkaline Phosphatase: 63 U/L (ref 38–126)
Anion gap: 10 (ref 5–15)
BUN: 9 mg/dL (ref 6–20)
CO2: 28 mmol/L (ref 22–32)
Calcium: 10.1 mg/dL (ref 8.9–10.3)
Chloride: 102 mmol/L (ref 98–111)
Creatinine, Ser: 0.76 mg/dL (ref 0.61–1.24)
GFR calc Af Amer: >60 mL/min (ref >60)
GFR calc non Af Amer: >60 mL/min (ref >60)
Glucose, Bld: 118 mg/dL — ABNORMAL HIGH (ref 70–99)
Potassium: 3.4 mmol/L — ABNORMAL LOW (ref 3.5–5.1)
Sodium: 140 mmol/L (ref 135–145)
Total Bilirubin: 0.9 mg/dL (ref 0.3–1.2)
Total Protein: 6.8 g/dL (ref 6.5–8.1)

## 2019-10-15 LAB — LIPASE, BLOOD: Lipase: 20 U/L (ref 11–51)

## 2019-10-15 LAB — RAPID URINE DRUG SCREEN, HOSP PERFORMED
Amphetamines: NOT DETECTED
Barbiturates: NOT DETECTED
Benzodiazepines: NOT DETECTED
Cocaine: NOT DETECTED
Opiates: NOT DETECTED
Tetrahydrocannabinol: NOT DETECTED

## 2019-10-15 LAB — ETHANOL: Alcohol, Ethyl (B): 241 mg/dL — ABNORMAL HIGH (ref <10)

## 2019-10-15 MED ORDER — CHLORPROMAZINE HCL 25 MG PO TABS
25.0000 mg | ORAL_TABLET | Freq: Once | ORAL | Status: AC
  Administered 2019-10-15: 25 mg via ORAL
  Filled 2019-10-15: qty 1

## 2019-10-15 MED ORDER — ATENOLOL 25 MG PO TABS
25.0000 mg | ORAL_TABLET | Freq: Every day | ORAL | Status: DC
  Administered 2019-10-15 – 2019-10-16 (×2): 25 mg via ORAL
  Filled 2019-10-15 (×2): qty 1

## 2019-10-15 MED ORDER — LORAZEPAM 1 MG PO TABS: 0.0000 mg | ORAL_TABLET | Freq: Two times a day (BID) | ORAL | Status: DC

## 2019-10-15 MED ORDER — THIAMINE HCL 100 MG PO TABS
100.0000 mg | ORAL_TABLET | Freq: Once | ORAL | Status: AC
  Administered 2019-10-15: 100 mg via ORAL
  Filled 2019-10-15: qty 1

## 2019-10-15 MED ORDER — HYDROXYZINE HCL 25 MG PO TABS
25.0000 mg | ORAL_TABLET | Freq: Every evening | ORAL | Status: DC | PRN
  Administered 2019-10-16: 05:00:00 25 mg via ORAL
  Filled 2019-10-15 (×2): qty 1

## 2019-10-15 MED ORDER — LORAZEPAM 2 MG/ML IJ SOLN: 0.0000 mg | Freq: Four times a day (QID) | INTRAMUSCULAR | Status: DC

## 2019-10-15 MED ORDER — FOLIC ACID 1 MG PO TABS
1.0000 mg | ORAL_TABLET | Freq: Once | ORAL | Status: AC
  Administered 2019-10-15: 1 mg via ORAL
  Filled 2019-10-15: qty 1

## 2019-10-15 MED ORDER — ONDANSETRON HCL 4 MG PO TABS
4.0000 mg | ORAL_TABLET | Freq: Three times a day (TID) | ORAL | Status: DC | PRN
  Administered 2019-10-16: 4 mg via ORAL
  Filled 2019-10-15: qty 1

## 2019-10-15 MED ORDER — LORAZEPAM 1 MG PO TABS
0.0000 mg | ORAL_TABLET | Freq: Four times a day (QID) | ORAL | Status: DC
  Administered 2019-10-16 (×2): 1 mg via ORAL
  Filled 2019-10-15 (×2): qty 1

## 2019-10-15 MED ORDER — SODIUM CHLORIDE 0.9% FLUSH
3.0000 mL | Freq: Once | INTRAVENOUS | Status: AC
  Administered 2019-10-15: 3 mL via INTRAVENOUS

## 2019-10-15 MED ORDER — THIAMINE HCL 100 MG PO TABS
100.0000 mg | ORAL_TABLET | Freq: Every day | ORAL | Status: DC
  Administered 2019-10-15 – 2019-10-16 (×2): 100 mg via ORAL
  Filled 2019-10-15 (×2): qty 1

## 2019-10-15 MED ORDER — LACTATED RINGERS IV BOLUS
1000.0000 mL | Freq: Once | INTRAVENOUS | Status: AC
  Administered 2019-10-15: 1000 mL via INTRAVENOUS

## 2019-10-15 MED ORDER — LORAZEPAM 2 MG/ML IJ SOLN: 0.0000 mg | Freq: Two times a day (BID) | INTRAMUSCULAR | Status: DC

## 2019-10-15 MED ORDER — SPIRONOLACTONE 25 MG PO TABS
50.0000 mg | ORAL_TABLET | Freq: Two times a day (BID) | ORAL | Status: DC
  Administered 2019-10-15 – 2019-10-16 (×2): 50 mg via ORAL
  Filled 2019-10-15 (×2): qty 2

## 2019-10-15 MED ORDER — THIAMINE HCL 100 MG/ML IJ SOLN: 100.0000 mg | Freq: Every day | INTRAMUSCULAR | Status: DC

## 2019-10-15 MED ORDER — ADULT MULTIVITAMIN W/MINERALS CH
1.0000 | ORAL_TABLET | Freq: Once | ORAL | Status: AC
  Administered 2019-10-15: 1 via ORAL
  Filled 2019-10-15: qty 1

## 2019-10-15 MED ORDER — LORAZEPAM 2 MG/ML IJ SOLN
1.0000 mg | Freq: Once | INTRAMUSCULAR | Status: AC
  Administered 2019-10-15: 1 mg via INTRAVENOUS
  Filled 2019-10-15: qty 1

## 2019-10-15 MED ORDER — IBUPROFEN 200 MG PO TABS
600.0000 mg | ORAL_TABLET | Freq: Three times a day (TID) | ORAL | Status: DC | PRN
  Administered 2019-10-16: 600 mg via ORAL
  Filled 2019-10-15: qty 3

## 2019-10-15 MED ORDER — ALUM & MAG HYDROXIDE-SIMETH 200-200-20 MG/5ML PO SUSP
30.0000 mL | Freq: Four times a day (QID) | ORAL | Status: DC | PRN
  Administered 2019-10-16 (×2): 30 mL via ORAL
  Filled 2019-10-15 (×3): qty 30

## 2019-10-15 NOTE — ED Notes (Signed)
TTS cart at beside.

## 2019-10-15 NOTE — ED Triage Notes (Signed)
Pt reports that having abd pains and belching. 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  Pt reports is living with a person from [place] that he met.

## 2019-10-15 NOTE — ED Notes (Signed)
TTS provider on screen speaking with pt at this time.

## 2019-10-15 NOTE — ED Provider Notes (Signed)
[place] DEPT Provider Note   CSN: [id number] Arrival date & time: 10/15/19  1529     History Chief Complaint  Patient presents with  . Abdominal Pain  . belching    Nicolas [name] is a 54 y.o. adult.  HPI   82yM with etoh abuse. Complaining of many issue related to this. Doesn't feel well. Diet is poor. Affecting relationship with roommate. Says he is in [place] but has been lying to his sponsor recently. Feels hopeless at times but doesn't want to harm himself or anyone else. Last drank today. Belching frequently through the day today. No abdominal pain.  History reviewed. No pertinent past medical history.  Patient Active Problem List   Diagnosis Date Noted  . MDD (major depressive disorder) 10/11/2019    History reviewed. No pertinent surgical history.    No family history on file.  Social History   Tobacco Use  . Smoking status: Current Every Day Smoker    Packs/day: 0.50    Types: Cigarettes  . Smokeless tobacco: Never Used  Substance Use Topics  . Alcohol use: Yes    Alcohol/week: 6.0 standard drinks    Types: 6 Cans of beer per week  . Drug use: Not Currently    Types: Cocaine    Comment: stopped 01/2019    Home Medications Prior to Admission medications   Medication Sig Start Date End Date Taking? Authorizing Provider  atenolol (TENORMIN) 25 MG tablet Take 25 mg by mouth daily. 08/31/19   [provider]  estradiol (CLIMARA - DOSED IN MG/24 HR) 0.1 mg/24hr patch Place 0.1 mg onto the skin once a week. 10/06/19   [provider]  hydrOXYzine (ATARAX/VISTARIL) 25 MG tablet Take 25 mg by mouth at bedtime as needed for anxiety (sleep).  06/27/19   [provider]  spironolactone (ALDACTONE) 50 MG tablet Take 50 mg by mouth 2 (two) times daily. 08/22/19   [provider]    Allergies    Caffeine  Review of Systems   Review of Systems All systems reviewed and negative, other than as noted in  HPI.  Physical Exam Updated Vital Signs BP 126/83   Pulse 74   Temp 99.5 F (37.5 C)   Resp 19   SpO2 93%   Physical Exam Vitals and nursing note reviewed.  Constitutional:      General: Nicolas [name] is not in acute distress.    Appearance: Nicolas [name] is well-developed.  HENT:     Head: Normocephalic and atraumatic.  Eyes:     General:        Right eye: No discharge.        Left eye: No discharge.     Conjunctiva/sclera: Conjunctivae normal.  Cardiovascular:     Rate and Rhythm: Normal rate and regular rhythm.     Heart sounds: Normal heart sounds. No murmur. No friction rub. No gallop.   Pulmonary:     Effort: Pulmonary effort is normal. No respiratory distress.     Breath sounds: Normal breath sounds.  Abdominal:     General: There is no distension.     Palpations: Abdomen is soft.     Tenderness: There is no abdominal tenderness.  Musculoskeletal:        General: No tenderness.     Cervical back: Neck supple.  Skin:    General: Skin is warm and dry.  Neurological:     Mental Status: Nicolas [name] is alert.  Psychiatric:  Comments: Emotionally labile     ED Results / Procedures / Treatments   Labs (all labs ordered are listed, but only abnormal results are displayed) Labs Reviewed  COMPREHENSIVE METABOLIC PANEL - Abnormal; Notable for the following components:      Result Value   Potassium 3.4 (*)    Glucose, Bld 118 (*)    AST 52 (*)    All other components within normal limits  URINALYSIS, ROUTINE W REFLEX MICROSCOPIC - Abnormal; Notable for the following components:   Color, Urine COLORLESS (*)    Specific Gravity, Urine 1.002 (*)    All other components within normal limits  ETHANOL - Abnormal; Notable for the following components:   Alcohol, Ethyl (B) 241 (*)    All other components within normal limits  LIPASE, BLOOD  CBC  RAPID URINE DRUG SCREEN, HOSP PERFORMED    EKG EKG Interpretation  Date/Time:  Tuesday Oct 15 2019 18:18:11  EDT Ventricular Rate:  72 PR Interval:    QRS Duration: 111 QT Interval:  396 QTC Calculation: 434 R Axis:   -27 Text Interpretation: Sinus rhythm Borderline left axis deviation Confirmed by Raeford Razor [phone]) on 10/15/2019 6:53:22 PM   Radiology No results found.  Procedures Procedures (including critical care time)  Medications Ordered in ED Medications  sodium chloride flush (NS) 0.9 % injection 3 mL (has no administration in time range)  lactated ringers bolus 1,000 mL (1,000 mLs Intravenous New Bag/Given 10/15/19 1822)  thiamine tablet 100 mg (100 mg Oral Given 10/15/19 1814)  LORazepam (ATIVAN) injection 1 mg (1 mg Intravenous Given 10/15/19 1816)  chlorproMAZINE (THORAZINE) tablet 25 mg (25 mg Oral Given 10/15/19 1814)  folic acid (FOLVITE) tablet 1 mg (1 mg Oral Given 10/15/19 1814)  multivitamin with minerals tablet 1 tablet (1 tablet Oral Given 10/15/19 1814)    ED Course  I have reviewed the triage vital signs and the nursing notes.  Pertinent labs & imaging results that were available during my care of the patient were reviewed by me and considered in my medical decision making (see chart for details).    MDM Rules/Calculators/A&P                      53yM with ETOH abuse and numerous complaints related to it. I doubt emergent issue. Reports feeling of hopelessness. Denies wanting to harm himself or anyone else though.  Will medically screen for TTS evaluation.  Ethanol elevated. He is medically cleared though.   Final Clinical Impression(s) / ED Diagnoses Final diagnoses:  Alcohol abuse  Depression, unspecified depression type    Rx / DC Orders ED Discharge Orders    None       Raeford Razor, MD 10/15/19 1956

## 2019-10-15 NOTE — BH Assessment (Signed)
Tele Assessment Note   Patient Name: Nicolas [name] MRN: [id number] Referring Physician: Dr. Raeford Razor, MD Location of Patient: [facility] Location of Provider: Behavioral Health TTS Department  Nicolas [name] is a 54 y.o. 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 Pt states he is at the hospital, "'cause I want to die cause the world is too much to take - too much bad and not enough good. I'm also an alcoholic. I also want to live but the world, for me, is too full of pain. There is no love, peace, happiness, joy, friendship, love - I think I already said that." Pt states he has been feeing this way for 5 years.  Pt endorses SI and explains "how amazing it would feel to put a 55mm against my skull," and then goes into detail about the metal going through his head and his feelings pouring out, etc. Pt denies he has access to a gun or weapons. He denies he has ever attempted to kill himself. He states he was last hospitalized when he went to [facility] in October 2020; he states he was able to remain sober until January 2021. Pt denies he has a therapist or a psychiatrist due to his insurance covering either service.  Pt denies HI, NSSIB, or engagement with the legal system. Clinician was unable to understand pt's answer as to whether he has been experiencing AVH, even after asking for clarification. Pt states he is currently drinking 6 12-ounce beers on a daily basis. His BAL was 241 at 1754; the time of this assessment was 2016.  Pt's protective factors are a lack of HI and the fact that pt wants to get better. Pt has been able to maintain sobriety in the past.  Pt denies he had anyone for clinician to contact for collateral information.  Pt's orientation was UTA. His memory was UTA. Pt was under the influence during the assessment, though he was overall cooperative. Pt's insight, judgement, and  impulse control is poor at this time.   Diagnosis: F10.24, Alcohol-induced depressive disorder, With moderate use disorder   Past Medical History: History reviewed. No pertinent past medical history.  History reviewed. No pertinent surgical history.  Family History: No family history on file.  Social History:  reports that Nicolas [name] has been smoking cigarettes. Nicolas [name] has been smoking about 0.50 packs per day. Nicolas [name] has never used smokeless tobacco. Nicolas [name] reports current alcohol use of about 6.0 standard drinks of alcohol per week. Nicolas [name] reports previous drug use. Drug: Cocaine.  Additional Social History:  Alcohol / Drug Use Pain Medications: Please see MAR Prescriptions: Please see MAR Over the Counter: Please see MAR History of alcohol / drug use?: Yes Longest period of sobriety (when/how long): October 2020 - January 2021 after treatment at [facility] Substance #1 Name of Substance 1: Nicolas [name] 1 - Age of First Use: 15 1 - Amount (size/oz): 6 12-ounce beers 1 - Frequency: Daily 1 - Duration: Unknown 1 - Last Use / Amount: 10/15/2019  CIWA: CIWA-Ar BP: 110/74 Pulse Rate: 79 COWS:    Allergies:  Allergies  Allergen Reactions  . Caffeine Palpitations    Home Medications: (Not in a hospital admission)   OB/GYN Status:  No LMP recorded.  General Assessment Data Location of Assessment: [facility] TTS Assessment: In system Is this a Tele or Face-to-Face Assessment?: Tele Assessment  Is this an Initial Assessment or a Re-assessment for this encounter?: Initial Assessment Patient Accompanied by:: N/A Language Other than English: No Living Arrangements: Other (Comment)(Pt is staying w/ an NA group member) What gender do you identify as?: Male Marital status: Single Living Arrangements: Non-relatives/Friends Can pt return to current living arrangement?: Yes Admission Status: Voluntary Is patient capable of signing voluntary  admission?: Yes Referral Source: Self/Family/Friend Insurance type: [employer]     Crisis Care Plan Living Arrangements: Non-relatives/Friends Legal Guardian: Other:(Self) Name of Psychiatrist: None Name of Therapist: AA Meetings  Education Status Is patient currently in school?: No Is the patient employed, unemployed or receiving disability?: Unemployed  Risk to self with the past 6 months Suicidal Ideation: Yes-Currently Present Has patient been a risk to self within the past 6 months prior to admission? : Yes Suicidal Intent: No Has patient had any suicidal intent within the past 6 months prior to admission? : No Is patient at risk for suicide?: Yes Suicidal Plan?: Yes-Currently Present Has patient had any suicidal plan within the past 6 months prior to admission? : No Specify Current Suicidal Plan: Pt plans to shoot himself in the head Access to Means: No(Pt denies he has access to a gun) What has been your use of drugs/alcohol within the last 12 months?: Pt states he has been using Nicolas [name] Previous Attempts/Gestures: No How many times?: 0 Other Self Harm Risks: Nicolas [name] abuse Triggers for Past Attempts: None known Intentional Self Injurious Behavior: None Family Suicide History: Unable to assess Recent stressful life event(s): Other (Comment), Conflict (Comment)(Pt does not get along w/ roommate, poor liver function) Persecutory voices/beliefs?: No Depression: Yes Depression Symptoms: Despondent, Guilt, Feeling worthless/self pity Substance abuse history and/or treatment for substance abuse?: Yes Suicide prevention information given to non-admitted patients: Not applicable  Risk to Others within the past 6 months Homicidal Ideation: No Does patient have any lifetime risk of violence toward others beyond the six months prior to admission? : Unknown Thoughts of Harm to Others: No Current Homicidal Intent: No Current Homicidal Plan: No Access to Homicidal Means:  No Identified Victim: None noted History of harm to others?: (UTA) Assessment of Violence: None Noted Violent Behavior Description: None noted Does patient have access to weapons?: No(Pt denied access to guns/weapons) Criminal Charges Pending?: No Does patient have a court date: No Is patient on probation?: No  Psychosis Hallucinations: (UTA) Delusions: None noted  Mental Status Report Appearance/Hygiene: Disheveled Eye Contact: Poor Motor Activity: Agitation Speech: Soft, Slow Level of Consciousness: Quiet/awake Mood: Depressed Affect: Appropriate to circumstance Anxiety Level: Minimal Thought Processes: Circumstantial Judgement: Impaired Orientation: Unable to assess Obsessive Compulsive Thoughts/Behaviors: Minimal  Cognitive Functioning Concentration: Normal Memory: Unable to Assess Is patient IDD: No Insight: Poor Impulse Control: Poor Appetite: (UTA) Have you had any weight changes? : (UTA) Sleep: Unable to Assess Total Hours of Sleep: (UTA) Vegetative Symptoms: Unable to Assess  ADLScreening [facility] Assessment Services) Patient's cognitive ability adequate to safely complete daily activities?: Yes Patient able to express need for assistance with ADLs?: Yes Independently performs ADLs?: Yes (appropriate for developmental age)  Prior Inpatient Therapy Prior Inpatient Therapy: Yes Prior Therapy Dates: 2020 Prior Therapy Facilty/Provider(s): [facility] Reason for Treatment: substance abuse tx  Prior Outpatient Therapy Prior Outpatient Therapy: No Does patient have an ACCT team?: No Does patient have Intensive In-House Services?  : No Does patient have [facility] services? : No Does patient have P4CC services?: No  ADL Screening (condition at time of admission) Patient's cognitive  ability adequate to safely complete daily activities?: Yes Is the patient deaf or have difficulty hearing?: No Does the patient have difficulty seeing, even when wearing  glasses/contacts?: No Does the patient have difficulty concentrating, remembering, or making decisions?: No Patient able to express need for assistance with ADLs?: Yes Does the patient have difficulty dressing or bathing?: No Independently performs ADLs?: Yes (appropriate for developmental age) Does the patient have difficulty walking or climbing stairs?: No Weakness of Legs: None Weakness of Arms/Hands: None  Home Assistive Devices/Equipment Home Assistive Devices/Equipment: None  Therapy Consults (therapy consults require a physician order) PT Evaluation Needed: No OT Evalulation Needed: No SLP Evaluation Needed: No Abuse/Neglect Assessment (Assessment to be complete while patient is alone) Abuse/Neglect Assessment Can Be Completed: Unable to assess, patient is non-responsive or altered mental status Values / Beliefs Cultural Requests During Hospitalization: (UTA) Spiritual Requests During Hospitalization: (UTA) Consults Spiritual Care Consult Needed: (UTA) Transition of Care Team Consult Needed: (UTA) Advance Directives (For Healthcare) Does Patient Have a Medical Advance Directive?: Unable to assess, patient is non-responsive or altered mental status          Disposition: Adaku Anike, NP, reviewed pt's chart and information and determined pt should be observed overnight for safety and stability and re-assessed in the morning by psychiatry. This information was provided to pt's team, including his nurse, Janett Billow RN, and his provider, Dr. Wilson Singer, via internal messenger at 2040.   Disposition Initial Assessment Completed for this Encounter: Yes Patient referred to: Other (Comment)(Pt will be observed overnight for safety and stability)  This service was provided via telemedicine using a 2-way, interactive audio and video technology.  Names of all persons participating in this telemedicine service and their role in this encounter. Name: Cohl Behrens Role: Patient  Name:  Talbot Grumbling Role: Nurse Practitioner  Name: Windell Hummingbird Role: Clinician    Dannielle Burn 10/15/2019 9:05 PM

## 2019-10-16 ENCOUNTER — Encounter (HOSPITAL_COMMUNITY): Payer: Self-pay | Admitting: Registered Nurse

## 2019-10-16 DIAGNOSIS — F1020 Alcohol dependence, uncomplicated: Secondary | ICD-10-CM | POA: Diagnosis present

## 2019-10-16 DIAGNOSIS — F1024 Alcohol dependence with alcohol-induced mood disorder: Secondary | ICD-10-CM | POA: Diagnosis present

## 2019-10-16 DIAGNOSIS — F10229 Alcohol dependence with intoxication, unspecified: Secondary | ICD-10-CM | POA: Diagnosis present

## 2019-10-16 MED ORDER — THIAMINE HCL 100 MG PO TABS
100.0000 mg | ORAL_TABLET | Freq: Every day | ORAL | 0 refills | Status: DC
Start: 2019-10-16 — End: 2019-10-26

## 2019-10-16 NOTE — ED Notes (Addendum)
Ambulated to TCU without incident. Belongings locked in TCU locker 30. Exits my care.

## 2019-10-16 NOTE — BH Assessment (Signed)
[facility] Assessment Progress Note  Per Shuvon Rankin, FNP, this pt does not require psychiatric hospitalization at this time.  Pt is to be discharged from the [facility] Observation Unit with referral information for area substance abuse treatment providers.  This has been included in pt's discharge instructions.  Pt would also benefit from seeing Peer Support Specialists, and a peer support consult has been ordered for pt.  Pt's nurse, Waynetta Sandy, has been notified.  Doylene Canning, MA Triage Specialist [phone]

## 2019-10-16 NOTE — ED Notes (Signed)
Pt is awake, alert, calm and cooperative at this time. C/O HA. CIWA score obtained. Med a/o. VSS. Breakfast at bedside. Awaits psychiatric eval this morning. Pt is aware and agreeable to plan. No further complaints. Cont to monitor.

## 2019-10-16 NOTE — Consult Note (Signed)
[facility] Psych ED Discharge  10/16/2019 11:11 AM Nicolas [name]  MRN:  [id number] Principal Problem: Alcohol-induced depressive disorder with moderate or severe use disorder with onset during intoxication [facility]) Discharge Diagnoses: Principal Problem:   Alcohol-induced depressive disorder with moderate or severe use disorder with onset during intoxication ([place]) Active Problems:   Alcohol use disorder, severe, dependence ([place])   Subjective: "I'm doing okay"  Assessment: Kathy Breach, 54 y.o., adult patient seen via tele psych by this provider, Dr. Dwyane Dee; and chart reviewed on 10/16/19.  On evaluation Nicolas [name] reports he was intoxicated when he came to the hospital but feeling "okay" this morning.  Patient states that he moved "here" to stay with a friend who offered to get him away from his current living conditions in [place].  "I came to live with someone who offered to get away from [place]; and away from the people doing drugs and alcohol.  But the last couple of months I have been trying to find my own place and move out cause she is crazy; and I don't want to be there anymore.  My drinking has started up again; and I need a place where I can go and stay for a short while to get myself together and find me a place."  Patient states that he doesn't want to go to a half way house "I don't want to go stay in a house with 6 or 8 other drug addicts and alcoholics.  My biggest problem here is finding a place that accepts my insurance."  Patient any other psychiatric history other than alcohol use. During evaluation Nicolas [name] is alert/oriented x 4; calm/cooperative; and mood is congruent with affect.  He does not appear to be responding to internal/external stimuli or delusional thoughts.  Patient denies suicidal/self-harm/homicidal ideation, psychosis, and paranoia.  Patient answered question appropriately.  Peer support consult ordered to assist patient with community services for substance use and  rehab services/referral     Total Time spent with patient: 30 minutes  Past Psychiatric History: Alcohol use disorder  Past Medical History: History reviewed. No pertinent past medical history. History reviewed. No pertinent surgical history. Family History: History reviewed. No pertinent family history. Family Psychiatric  History: Unaware Social History:  Social History   Substance and Sexual Activity  Alcohol Use Yes  . Alcohol/week: 6.0 standard drinks  . Types: 6 Cans of beer per week     Social History   Substance and Sexual Activity  Drug Use Not Currently  . Types: Cocaine   Comment: stopped 01/2019    Social History   Socioeconomic History  . Marital status: Single    Spouse name: Not on file  . Number of children: Not on file  . Years of education: Not on file  . Highest education level: Not on file  Occupational History  . Not on file  Tobacco Use  . Smoking status: Current Every Day Smoker    Packs/day: 0.50    Types: Cigarettes  . Smokeless tobacco: Never Used  Substance and Sexual Activity  . Alcohol use: Yes    Alcohol/week: 6.0 standard drinks    Types: 6 Cans of beer per week  . Drug use: Not Currently    Types: Cocaine    Comment: stopped 01/2019  . Sexual activity: Not on file  Other Topics Concern  . Not on file  Social History Narrative  . Not on file   Social Determinants of Health   Financial Resource Strain:   .  Difficulty of Paying Living Expenses:   Food Insecurity:   . Worried About Programme researcher, broadcasting/film/video in the Last Year:   . Barista in the Last Year:   Transportation Needs:   . Freight forwarder (Medical):   Marland Kitchen Lack of Transportation (Non-Medical):   Physical Activity:   . Days of Exercise per Week:   . Minutes of Exercise per Session:   Stress:   . Feeling of Stress :   Social Connections:   . Frequency of Communication with Friends and Family:   . Frequency of Social Gatherings with Friends and Family:   .  Attends Religious Services:   . Active Member of Clubs or Organizations:   . Attends Banker Meetings:   Marland Kitchen Marital Status:     Has this patient used any form of tobacco in the last 30 days? (Cigarettes, Smokeless Tobacco, Cigars, and/or Pipes) A prescription for an FDA-approved tobacco cessation medication was offered at discharge and the patient refused  Current Medications: Current Facility-Administered Medications  Medication Dose Route Frequency Provider Last Rate Last Admin  . alum & mag hydroxide-simeth (MAALOX/MYLANTA) 200-200-20 MG/5ML suspension 30 mL  30 mL Oral Q6H PRN Raeford Razor, MD   30 mL at 10/16/19 0435  . atenolol (TENORMIN) tablet 25 mg  25 mg Oral Daily Raeford Razor, MD   25 mg at 10/16/19 1016  . hydrOXYzine (ATARAX/VISTARIL) tablet 25 mg  25 mg Oral QHS PRN Raeford Razor, MD   25 mg at 10/16/19 0435  . ibuprofen (ADVIL) tablet 600 mg  600 mg Oral Q8H PRN Raeford Razor, MD   600 mg at 10/16/19 0826  . LORazepam (ATIVAN) injection 0-4 mg  0-4 mg Intravenous Q6H Raeford Razor, MD       Or  . LORazepam (ATIVAN) tablet 0-4 mg  0-4 mg Oral Q6H Raeford Razor, MD   1 mg at 10/16/19 4034  . [START ON 10/18/2019] LORazepam (ATIVAN) injection 0-4 mg  0-4 mg Intravenous Q12H Raeford Razor, MD       Or  . Melene Muller ON 10/18/2019] LORazepam (ATIVAN) tablet 0-4 mg  0-4 mg Oral Q12H Raeford Razor, MD      . ondansetron [facility] tablet 4 mg  4 mg Oral Q8H PRN Raeford Razor, MD   4 mg at 10/16/19 0435  . spironolactone (ALDACTONE) tablet 50 mg  50 mg Oral BID Raeford Razor, MD   50 mg at 10/16/19 1017  . thiamine tablet 100 mg  100 mg Oral Daily Raeford Razor, MD   100 mg at 10/16/19 1017   Or  . thiamine (B-1) injection 100 mg  100 mg Intravenous Daily Raeford Razor, MD       Current Outpatient Medications  Medication Sig Dispense Refill  . atenolol (TENORMIN) 25 MG tablet Take 25 mg by mouth daily.    Marland Kitchen estradiol (CLIMARA - DOSED IN MG/24 HR) 0.1 mg/24hr patch  Place 0.1 mg onto the skin once a week.    . hydrOXYzine (ATARAX/VISTARIL) 25 MG tablet Take 25 mg by mouth at bedtime as needed for anxiety (sleep).     Marland Kitchen spironolactone (ALDACTONE) 50 MG tablet Take 50 mg by mouth 2 (two) times daily.     PTA Medications: (Not in a hospital admission)   Musculoskeletal: Strength & Muscle Tone: within normal limits Gait & Station: normal Patient leans: N/A  Psychiatric Specialty Exam: Physical Exam Vitals and nursing note reviewed. Exam conducted with a chaperone present Psychiatrist).  Constitutional:  Appearance: Normal appearance.  Pulmonary:     Effort: Pulmonary effort is normal.  Neurological:     Mental Status: Nicolas [name] is alert.  Psychiatric:        Attention and Perception: Attention and perception normal. Nicolas [name] does not perceive auditory or visual hallucinations.        Mood and Affect: Mood and affect normal.        Speech: Speech normal.        Behavior: Behavior normal. Behavior is cooperative.        Thought Content: Thought content is not paranoid or delusional. Thought content does not include homicidal or suicidal ideation.        Cognition and Memory: Cognition and memory normal.        Judgment: Judgment normal.     Review of Systems  Psychiatric/Behavioral: Negative for agitation, confusion, hallucinations, self-injury, sleep disturbance and suicidal ideas. The patient is not nervous/anxious.        Reporting feeling some stress related to trying to find another place to live.  Also states that recent relapse of alcohol use and wanting to get help finding a short term rehab  All other systems reviewed and are negative.   Blood pressure 136/90, pulse 64, temperature 99.5 F (37.5 C), resp. rate 16, SpO2 97 %.There is no height or weight on file to calculate BMI.  General Appearance: Casual  Eye Contact:  Good  Speech:  Clear and Coherent and Normal Rate  Volume:  Normal  Mood:  Depressed  Affect:   Appropriate and Congruent  Thought Process:  Coherent, Goal Directed and Descriptions of Associations: Intact  Orientation:  Full (Time, Place, and Person)  Thought Content:  WDL  Suicidal Thoughts:  No  Homicidal Thoughts:  No  Memory:  Immediate;   Good Recent;   Good  Judgement:  Intact  Insight:  Present  Psychomotor Activity:  Normal  Concentration:  Concentration: Good and Attention Span: Good  Recall:  Good  Fund of Knowledge:  Good  Language:  Good  Akathisia:  No  Handed:  Right  AIMS (if indicated):     Assets:  Communication Skills Desire for Improvement Financial Resources/Insurance Housing Physical Health Social Support  ADL's:  Intact  Cognition:  WNL  Sleep:        Demographic Factors:  Male and Caucasian  Loss Factors: NA  Historical Factors: Impulsivity  Risk Reduction Factors:   Religious beliefs about death  Continued Clinical Symptoms:  Alcohol/Substance Abuse/Dependencies  Cognitive Features That Contribute To Risk:  None    Suicide Risk:  Minimal: No identifiable suicidal ideation.  Patients presenting with no risk factors but with morbid ruminations; may be classified as minimal risk based on the severity of the depressive symptoms    Plan Of Care/Follow-up recommendations:  Activity:  As tolerated Diet:  Heart healthy    Discharge Instructions     To help you maintain a sober lifestyle, a substance abuse treatment program may be beneficial to you.  Contact one of the following facilities at your earliest opportunity to ask about enrolling:  RESIDENTIAL PROGRAMS:       [facility]      [address]      [place], [place] [address]      [phone]       [facility] [place]      [address], [place] [address]      [phone]  [facility]      [address]      [place], [place] [address]      [phone]  CHEMICAL DEPENDENCY INTENSIVE OUTPATIENT PROGRAMS:       [facility]      [address] [employer]      [place], [place] [address]      [phone]       [facility]      [address] [place], [place] [address]      [phone]    Disposition:  Psychiatrically cleared No evidence of imminent risk to self or others at present.   Patient does not meet criteria for psychiatric inpatient admission. Supportive therapy provided about ongoing stressors. Refer to IOP. Discussed crisis plan, support from social network, calling 911, coming to the Emergency Department, and calling Suicide Hotline.  Bary Limbach, NP 10/16/2019, 11:11 AM

## 2019-10-16 NOTE — Patient Outreach (Signed)
[facility] met with Pt an processed with Pt to gain information as to what reasons Pt was back in the ER. [facility]PSS was made aware that Pt felt as if he was not himself an that he did not feel safe driving. Pt stated that he had all the resources that CPSS issued Pt a few days back.  Pt stated that he has court coming up an a few doctors appointment that he needs to attend Pt stated that he will use continue to look over the information that he had receive previously. CPSS left contact information for Pt to use when he is ready. Pt stated that he has a plan for when his housing situation gets situated he will start attending Out Patient services.

## 2019-10-16 NOTE — Discharge Instructions (Signed)
To help you maintain a sober lifestyle, a substance abuse treatment program may be beneficial to you.  Contact one of the following facilities at your earliest opportunity to ask about enrolling:  RESIDENTIAL PROGRAMS:       [facility]      [address]      [place], [place] [address]      [phone]       [facility] [place]      [address] [place] [address]      [phone]       [facility]      [address]      [place], [place] [address]      [phone]  CHEMICAL DEPENDENCY INTENSIVE OUTPATIENT PROGRAMS:       [facility]      [address]      [place], [place] [address]      [phone]       [facility]      [address]      [place], [place] [address]      [phone]  

## 2019-10-18 ENCOUNTER — Emergency Department (HOSPITAL_COMMUNITY)
Admission: EM | Admit: 2019-10-18 | Discharge: 2019-10-19 | Disposition: A | Discharge status: Home / Self Care | Payer: 59 | Source: Home / Self Care | Attending: Emergency Medicine | Admitting: Emergency Medicine

## 2019-10-18 ENCOUNTER — Encounter (HOSPITAL_COMMUNITY): Payer: Self-pay

## 2019-10-18 ENCOUNTER — Other Ambulatory Visit: Payer: Self-pay

## 2019-10-18 DIAGNOSIS — R45851 Suicidal ideations: Secondary | ICD-10-CM | POA: Insufficient documentation

## 2019-10-18 DIAGNOSIS — K2970 Gastritis, unspecified, without bleeding: Secondary | ICD-10-CM | POA: Insufficient documentation

## 2019-10-18 DIAGNOSIS — F17210 Nicotine dependence, cigarettes, uncomplicated: Secondary | ICD-10-CM | POA: Insufficient documentation

## 2019-10-18 DIAGNOSIS — R1010 Upper abdominal pain, unspecified: Secondary | ICD-10-CM | POA: Insufficient documentation

## 2019-10-18 DIAGNOSIS — F10920 Alcohol use, unspecified with intoxication, uncomplicated: Secondary | ICD-10-CM

## 2019-10-18 DIAGNOSIS — F329 Major depressive disorder, single episode, unspecified: Secondary | ICD-10-CM | POA: Insufficient documentation

## 2019-10-18 DIAGNOSIS — Z20822 Contact with and (suspected) exposure to covid-19: Secondary | ICD-10-CM | POA: Insufficient documentation

## 2019-10-18 DIAGNOSIS — Z79899 Other long term (current) drug therapy: Secondary | ICD-10-CM | POA: Insufficient documentation

## 2019-10-18 DIAGNOSIS — K859 Acute pancreatitis without necrosis or infection, unspecified: Secondary | ICD-10-CM | POA: Diagnosis not present

## 2019-10-18 DIAGNOSIS — Y906 Blood alcohol level of 120-199 mg/100 ml: Secondary | ICD-10-CM | POA: Insufficient documentation

## 2019-10-18 DIAGNOSIS — K852 Alcohol induced acute pancreatitis without necrosis or infection: Secondary | ICD-10-CM | POA: Diagnosis not present

## 2019-10-18 HISTORY — DX: Alcohol abuse, uncomplicated: F10.10

## 2019-10-18 LAB — CBC WITH DIFFERENTIAL/PLATELET
Abs Immature Granulocytes: 0.06 10*3/uL (ref 0.00–0.07)
Basophils Absolute: 0.1 10*3/uL (ref 0.0–0.1)
Basophils Relative: 1 %
Eosinophils Absolute: 0.2 10*3/uL (ref 0.0–0.5)
Eosinophils Relative: 2 %
HCT: 44.9 % (ref 39.0–52.0)
Hemoglobin: 15.6 g/dL (ref 13.0–17.0)
Immature Granulocytes: 1 %
Lymphocytes Relative: 36 %
Lymphs Abs: 2.9 10*3/uL (ref 0.7–4.0)
MCH: 33.9 pg (ref 26.0–34.0)
MCHC: 34.7 g/dL (ref 30.0–36.0)
MCV: 97.6 fL (ref 80.0–100.0)
Monocytes Absolute: 0.6 10*3/uL (ref 0.1–1.0)
Monocytes Relative: 8 %
Neutro Abs: 4.2 10*3/uL (ref 1.7–7.7)
Neutrophils Relative %: 52 %
Platelets: 259 10*3/uL (ref 150–400)
RBC: 4.6 MIL/uL (ref 4.22–5.81)
RDW: 12 % (ref 11.5–15.5)
WBC: 8.1 10*3/uL (ref 4.0–10.5)
nRBC: 0 % (ref 0.0–0.2)

## 2019-10-18 LAB — COMPREHENSIVE METABOLIC PANEL
ALT: 47 U/L — ABNORMAL HIGH (ref 0–44)
AST: 61 U/L — ABNORMAL HIGH (ref 15–41)
Albumin: 4.3 g/dL (ref 3.5–5.0)
Alkaline Phosphatase: 68 U/L (ref 38–126)
Anion gap: 11 (ref 5–15)
BUN: 14 mg/dL (ref 6–20)
CO2: 25 mmol/L (ref 22–32)
Calcium: 10 mg/dL (ref 8.9–10.3)
Chloride: 101 mmol/L (ref 98–111)
Creatinine, Ser: 0.8 mg/dL (ref 0.61–1.24)
GFR calc Af Amer: >60 mL/min (ref >60)
GFR calc non Af Amer: >60 mL/min (ref >60)
Glucose, Bld: 112 mg/dL — ABNORMAL HIGH (ref 70–99)
Potassium: 4.1 mmol/L (ref 3.5–5.1)
Sodium: 137 mmol/L (ref 135–145)
Total Bilirubin: 1 mg/dL (ref 0.3–1.2)
Total Protein: 6.9 g/dL (ref 6.5–8.1)

## 2019-10-18 LAB — RAPID URINE DRUG SCREEN, HOSP PERFORMED
Amphetamines: NOT DETECTED
Barbiturates: NOT DETECTED
Benzodiazepines: NOT DETECTED
Cocaine: NOT DETECTED
Opiates: NOT DETECTED
Tetrahydrocannabinol: NOT DETECTED

## 2019-10-18 LAB — RESPIRATORY PANEL BY RT PCR (FLU A&B, COVID)
Influenza A by PCR: NEGATIVE
Influenza B by PCR: NEGATIVE
SARS Coronavirus 2 by RT PCR: NEGATIVE

## 2019-10-18 LAB — ACETAMINOPHEN LEVEL: Acetaminophen (Tylenol), Serum: <10 ug/mL — ABNORMAL LOW (ref 10–30)

## 2019-10-18 LAB — LIPASE, BLOOD: Lipase: 17 U/L (ref 11–51)

## 2019-10-18 LAB — SALICYLATE LEVEL: Salicylate Lvl: <7 mg/dL — ABNORMAL LOW (ref 7.0–30.0)

## 2019-10-18 LAB — ETHANOL: Alcohol, Ethyl (B): 139 mg/dL — ABNORMAL HIGH (ref <10)

## 2019-10-18 MED ORDER — LORAZEPAM 1 MG PO TABS
1.0000 mg | ORAL_TABLET | Freq: Once | ORAL | Status: AC
  Administered 2019-10-18: 1 mg via ORAL
  Filled 2019-10-18: qty 1

## 2019-10-18 MED ORDER — LORAZEPAM 1 MG PO TABS
0.0000 mg | ORAL_TABLET | Freq: Four times a day (QID) | ORAL | Status: DC
  Administered 2019-10-18: 2 mg via ORAL
  Administered 2019-10-18: 18:00:00 1 mg via ORAL
  Filled 2019-10-18: qty 1
  Filled 2019-10-18: qty 2

## 2019-10-18 MED ORDER — PANTOPRAZOLE SODIUM 20 MG PO TBEC
20.0000 mg | DELAYED_RELEASE_TABLET | Freq: Once | ORAL | Status: AC
  Administered 2019-10-18: 20:00:00 20 mg via ORAL
  Filled 2019-10-18: qty 1

## 2019-10-18 MED ORDER — THIAMINE HCL 100 MG/ML IJ SOLN: 100.0000 mg | Freq: Every day | INTRAMUSCULAR | Status: DC

## 2019-10-18 MED ORDER — THIAMINE HCL 100 MG PO TABS
100.0000 mg | ORAL_TABLET | Freq: Every day | ORAL | Status: DC
  Administered 2019-10-18: 18:00:00 100 mg via ORAL
  Filled 2019-10-18: qty 1

## 2019-10-18 MED ORDER — LIDOCAINE VISCOUS HCL 2 % MT SOLN
15.0000 mL | Freq: Once | OROMUCOSAL | Status: DC
  Filled 2019-10-18: qty 15

## 2019-10-18 MED ORDER — LORAZEPAM 2 MG/ML IJ SOLN: 0.0000 mg | Freq: Four times a day (QID) | INTRAMUSCULAR | Status: DC

## 2019-10-18 MED ORDER — ONDANSETRON 4 MG PO TBDP
4.0000 mg | ORAL_TABLET | Freq: Once | ORAL | Status: AC
  Administered 2019-10-18: 15:00:00 4 mg via ORAL
  Filled 2019-10-18: qty 1

## 2019-10-18 MED ORDER — ALUM & MAG HYDROXIDE-SIMETH 200-200-20 MG/5ML PO SUSP
30.0000 mL | Freq: Once | ORAL | Status: AC
  Administered 2019-10-18: 15:00:00 30 mL via ORAL
  Filled 2019-10-18: qty 30

## 2019-10-18 MED ORDER — LORAZEPAM 1 MG PO TABS: 0.0000 mg | ORAL_TABLET | Freq: Two times a day (BID) | ORAL | Status: DC

## 2019-10-18 MED ORDER — LORAZEPAM 2 MG/ML IJ SOLN: 0.0000 mg | Freq: Two times a day (BID) | INTRAMUSCULAR | Status: DC

## 2019-10-18 MED ORDER — ACETAMINOPHEN 325 MG PO TABS: 650.0000 mg | ORAL_TABLET | Freq: Once | ORAL | Status: DC

## 2019-10-18 NOTE — ED Triage Notes (Signed)
Patient states he is suicidal. Patient states he does not want to live any more with constant pain. Patient states, "I do not want to die, but I am in a shitty situation."  Patient states he drank 2 beers and a shot since 0900 today. Patient denies any other drug use.  patient denies any visual or auditory hallucinations, or HI.

## 2019-10-18 NOTE — BH Assessment (Signed)
Tele Assessment Note   Patient Name: Nicolas [name] MRN: [id number] Referring Physician: Alveria Apley, PA-C Location of Patient: [facility] Location of Provider: Behavioral Health TTS Department  Talmadge Ganas is an 54 y.o. adult who presents to the ED voluntarily. Pt reports he has been abusing alcohol for several months. Pt denies SI at present. Pt states he has thoughts of wanting to die but denies a plan. Pt states he feels that if he does not get into a treatment facility that he will die because it's easy to get alcohol. Pt states if he is d/c he is just going to leave and continue abusing alcohol. Pt states he has been living in a toxic environment which causes him to drink heavily because he is stressed. Pt states he returned to the home to get his belongings and brought the police with him because he did not want the individual to become irate. Pt states he is currently homeless and waiting to get his own place on May 29th. Pt states he was supposed to live with his sponsor however his sponsor did not want him to live with him because he did not want him to relapse. Pt states he has been experiencing medical complaints with his substance abuse including diarrhea and nausea. Pt states he wants to get his body accustomed to not drinking. Pt states he has anxiety whenever he does not drink and this causes him to want to drink. Pt states he attends AA meetings, but denies other OPT tx. Pt denies any prior suicide attempts.   Pt is alert and oriented during the assessment. Pt responds to questions appropriately. Pt denies HI and denies AVH. Pt was assessed by TTS on 10/15/2019 and at that time the pt endorsed SI with a plan to shoot himself. Pt does not endorse suicidal plan at time of current TTS assessment. Pt's judgement is partial. Pt impulse control is poor. Pt thought process is coherent and relevant. Pt states he wants to leave the hospital and go directly to a tx center. Pt states he has no  family or other collateral supports to provide at this time.   Nicolas Conn, NP states pt does not meet inpt criteria and recommends the pt f/u with long term tx needs for substance abuse. Nicolas Bunting, RN has been advised. TTS faxed resources to [phone] as requested.  Diagnosis: Alcohol induced mood d/o; Alcohol use d/o, severe  Past Medical History:  Past Medical History:  Diagnosis Date  . Alcohol abuse     Past Surgical History:  Procedure Laterality Date  . NASAL SEPTUM SURGERY      Family History: History reviewed. No pertinent family history.  Social History:  reports that Nicolas [name] has been smoking cigarettes. Nicolas [name] has been smoking about 0.50 packs per day. Nicolas [name] has never used smokeless tobacco. Nicolas [name] reports current alcohol use of about 6.0 standard drinks of alcohol per week. Nicolas [name] reports previous drug use. Drug: Cocaine.  Additional Social History:  Alcohol / Drug Use Pain Medications: See MAR Prescriptions: See MAR Over the Counter: See MAR History of alcohol / drug use?: Yes Substance #1 Name of Substance 1: Alcohol 1 - Age of First Use: 15 1 - Amount (size/oz): 2-3 beers 1 - Frequency: daily 1 - Duration: ongoing 1 - Last Use / Amount: 10/18/2019  CIWA: CIWA-Ar BP: 120/85 Pulse Rate: 65 Nausea and Vomiting: mild nausea with no vomiting Tactile Disturbances: none Tremor: three Auditory Disturbances: not present Paroxysmal Sweats:  two Visual Disturbances: not present Anxiety: three Headache, Fullness in Head: mild Agitation: normal activity Orientation and Clouding of Sensorium: oriented and can do serial additions CIWA-Ar Total: 11 COWS:    Allergies:  Allergies  Allergen Reactions  . Caffeine Palpitations    Home Medications: (Not in a hospital admission)   OB/GYN Status:  No LMP recorded.  General Assessment Data Location of Assessment: [facility] TTS Assessment: In system Is this a Tele or  Face-to-Face Assessment?: Tele Assessment Is this an Initial Assessment or a Re-assessment for this encounter?: Initial Assessment Patient Accompanied by:: N/A Language Other than English: No Living Arrangements: Homeless/Shelter What gender do you identify as?: Male Marital status: Single Pregnancy Status: No Living Arrangements: Alone Can pt return to current living arrangement?: Yes Admission Status: Voluntary Is patient capable of signing voluntary admission?: Yes Referral Source: Self/Family/Friend Insurance type: PRIMARY INS:  [employer] / [employer]     Crisis Care Plan Living Arrangements: Alone Name of Psychiatrist: None Name of Therapist: AA Meetings  Education Status Is patient currently in school?: No Is the patient employed, unemployed or receiving disability?: Unemployed  Risk to self with the past 6 months Suicidal Ideation: No-Not Currently/Within Last 6 Months Has patient been a risk to self within the past 6 months prior to admission? : No Suicidal Intent: No Has patient had any suicidal intent within the past 6 months prior to admission? : No Is patient at risk for suicide?: Yes Suicidal Plan?: No Has patient had any suicidal plan within the past 6 months prior to admission? : No Access to Means: No What has been your use of drugs/alcohol within the last 12 months?: alcohol Previous Attempts/Gestures: No Triggers for Past Attempts: None known Intentional Self Injurious Behavior: None Family Suicide History: No Recent stressful life event(s): Recent negative physical changes, Financial Problems, Turmoil (Comment)(homeless, substance abuse) Persecutory voices/beliefs?: No Depression: Yes Depression Symptoms: Despondent, Guilt, Loss of interest in usual pleasures, Feeling worthless/self pity Substance abuse history and/or treatment for substance abuse?: Yes Suicide prevention information given to non-admitted patients: Not applicable  Risk to  Others within the past 6 months Homicidal Ideation: No Does patient have any lifetime risk of violence toward others beyond the six months prior to admission? : No Thoughts of Harm to Others: No Current Homicidal Intent: No Current Homicidal Plan: No Access to Homicidal Means: No History of harm to others?: No Assessment of Violence: None Noted Does patient have access to weapons?: No Criminal Charges Pending?: No Does patient have a court date: No Is patient on probation?: No  Psychosis Hallucinations: None noted Delusions: None noted  Mental Status Report Appearance/Hygiene: Unremarkable Eye Contact: Good Motor Activity: Freedom of movement Speech: Logical/coherent, Tangential Level of Consciousness: Alert Mood: Depressed, Despair, Helpless, Worthless, low self-esteem, Anxious Affect: Anxious, Depressed Anxiety Level: Moderate Thought Processes: Relevant, Coherent Judgement: Partial Orientation: Person, Place, Time, Situation, Appropriate for developmental age Obsessive Compulsive Thoughts/Behaviors: None  Cognitive Functioning Concentration: Normal Memory: Remote Intact, Recent Intact Is patient IDD: No Insight: Poor Impulse Control: Poor Appetite: Fair Have you had any weight changes? : No Change Sleep: Decreased Total Hours of Sleep: 5 Vegetative Symptoms: None  ADLScreening [facility] Assessment Services) Patient's cognitive ability adequate to safely complete daily activities?: Yes Patient able to express need for assistance with ADLs?: Yes Independently performs ADLs?: Yes (appropriate for developmental age)  Prior Inpatient Therapy Prior Inpatient Therapy: Yes Prior Therapy Dates: 2020 Prior Therapy Facilty/Provider(s): [facility] Reason for Treatment: substance abuse  tx  Prior Outpatient Therapy Prior Outpatient Therapy: Yes Prior Therapy Dates: ongoing Prior Therapy Facilty/Provider(s): [facility] Reason for Treatment: substance abuse  tx Does patient have an ACCT team?: No Does patient have Intensive In-House Services?  : No Does patient have [facility] services? : No Does patient have P4CC services?: No  ADL Screening (condition at time of admission) Patient's cognitive ability adequate to safely complete daily activities?: Yes Is the patient deaf or have difficulty hearing?: No Does the patient have difficulty seeing, even when wearing glasses/contacts?: No Does the patient have difficulty concentrating, remembering, or making decisions?: No Patient able to express need for assistance with ADLs?: Yes Does the patient have difficulty dressing or bathing?: No Independently performs ADLs?: Yes (appropriate for developmental age) Does the patient have difficulty walking or climbing stairs?: No Weakness of Legs: None Weakness of Arms/Hands: None  Home Assistive Devices/Equipment Home Assistive Devices/Equipment: None    Abuse/Neglect Assessment (Assessment to be complete while patient is alone) Abuse/Neglect Assessment Can Be Completed: Yes Physical Abuse: Denies Verbal Abuse: Denies Sexual Abuse: Denies Exploitation of patient/patient's resources: Denies Self-Neglect: Denies     Regulatory affairs officer (For Healthcare) Does Patient Have a Medical Advance Directive?: No Would patient like information on creating a medical advance directive?: No - Patient declined          Disposition: Nicolas Romp, NP states pt does not meet inpt criteria and recommends the pt f/u with long term tx needs for substance abuse.  Disposition Initial Assessment Completed for this Encounter: Yes Disposition of Patient: Discharge Patient refused recommended treatment: No Mode of transportation if patient is discharged/movement?: Walking  This service was provided via telemedicine using a 2-way, interactive audio and video technology.  Names of all persons participating in this telemedicine service and their role in this  encounter. Name: Nicolas [name] Role: Patient  Name: Nicolas [name], [place] Role: TTS  Name: Nicolas Romp, NP Role: [facility] Provider       Nicolas [name] 10/19/2019 12:30 AM

## 2019-10-18 NOTE — Progress Notes (Signed)
Nira Conn, NP states pt does not meet inpt criteria and recommends the pt f/u with long term tx needs for substance abuse. Rob Bunting, RN has been advised. TTS faxed resources to [phone] as requested.

## 2019-10-18 NOTE — ED Provider Notes (Addendum)
[facility] DEPT Provider Note   CSN: [id number] Arrival date & time: 10/18/19  1223     History Chief Complaint  Patient presents with  . Suicidal  . Alcohol Intoxication    Nicolas [name] is a 54 y.o. adult presenting for evaluation of SI and abd pain.   Pt states he no longer wants to live, and this is why he is in the ER. He states he will not kill himself, but he doesn't want to be alive. He states he started drinking again, and is struggling with this.  He has been drinking today.  He states he is tired of living with the pain, and when he thinks about killing himself he thinks he will have no further pain.  He has not been stable living situation.  He denies HI or AVH.  He has not attempted self-harm today.   He also reports he is having persistent upper abdominal pain.  He reports nausea without vomiting.  He is belching and feels bloated.  He is not taking anything for this. He has a h/o elevated lfts and pancreatitis.   Additional history obtained from chart review.  This is pt's 3 visit for etoh use and SI.   HPI     Past Medical History:  Diagnosis Date  . Alcohol abuse     Patient Active Problem List   Diagnosis Date Noted  . Alcohol-induced depressive disorder with moderate or severe use disorder with onset during intoxication ([place]) 10/16/2019  . Alcohol use disorder, severe, dependence ([place]) 10/16/2019  . MDD (major depressive disorder) 10/11/2019    Past Surgical History:  Procedure Laterality Date  . NASAL SEPTUM SURGERY         History reviewed. No pertinent family history.  Social History   Tobacco Use  . Smoking status: Current Every Day Smoker    Packs/day: 0.50    Types: Cigarettes  . Smokeless tobacco: Never Used  Substance Use Topics  . Alcohol use: Yes    Alcohol/week: 6.0 standard drinks    Types: 6 Cans of beer per week    Comment: daily use  . Drug use: Not Currently    Types: Cocaine    Comment:  stopped 01/2019    Home Medications Prior to Admission medications   Medication Sig Start Date End Date Taking? Authorizing Provider  atenolol (TENORMIN) 25 MG tablet Take 25 mg by mouth daily. 08/31/19  Yes [provider]  bismuth subsalicylate (PEPTO BISMOL) 262 MG/15ML suspension Take 30 mLs by mouth every 6 (six) hours as needed for diarrhea or loose stools.   Yes [provider]  estradiol (CLIMARA - DOSED IN MG/24 HR) 0.1 mg/24hr patch Place 0.1 mg onto the skin once a week. 10/06/19  Yes [provider]  hydrOXYzine (ATARAX/VISTARIL) 25 MG tablet Take 25 mg by mouth at bedtime as needed for anxiety (sleep).  06/27/19  Yes [provider]  MELATONIN PO Take 2 tablets by mouth at bedtime.   Yes [provider]  Multiple Vitamin (MULTIVITAMIN WITH MINERALS) TABS tablet Take 1 tablet by mouth daily.   Yes [provider]  Probiotic Product (PROBIOTIC PO) Take 1 capsule by mouth daily.   Yes [provider]  spironolactone (ALDACTONE) 50 MG tablet Take 50 mg by mouth 2 (two) times daily. 08/22/19  Yes [provider]  thiamine 100 MG tablet Take 1 tablet (100 mg total) by mouth daily. Patient not taking: Reported on 10/18/2019 10/16/19  Bethann Berkshire, MD    Allergies    Caffeine  Review of Systems   Review of Systems  Gastrointestinal: Positive for abdominal pain and nausea.  Psychiatric/Behavioral: Positive for dysphoric mood and suicidal ideas.  All other systems reviewed and are negative.   Physical Exam Updated Vital Signs BP 126/89 (BP Location: Left Arm)   Pulse 87   Temp 97.9 F (36.6 C) (Oral)   Resp 18   Ht 5\' 9"  (1.753 m)   Wt 80.7 kg   SpO2 97%   BMI 26.29 kg/m   Physical Exam Vitals and nursing note reviewed.  Constitutional:      General: Nicolas [name] is not in acute distress.    Appearance: Nicolas [name] is well-developed.     Comments: Appears nontoxic  HENT:     Head: Normocephalic and  atraumatic.  Eyes:     Conjunctiva/sclera: Conjunctivae normal.     Pupils: Pupils are equal, round, and reactive to light.  Cardiovascular:     Rate and Rhythm: Normal rate and regular rhythm.     Pulses: Normal pulses.  Pulmonary:     Effort: Pulmonary effort is normal. No respiratory distress.     Breath sounds: Normal breath sounds. No wheezing.  Abdominal:     General: There is no distension.     Palpations: Abdomen is soft. There is no mass.     Tenderness: There is abdominal tenderness. There is no guarding or rebound.     Comments: ttp of the upper abd. No rigidity, guarding, distention. Negative rebound. No peritonitis. No cva tenderness  Musculoskeletal:        General: Normal range of motion.     Cervical back: Normal range of motion and neck supple.  Skin:    General: Skin is warm and dry.     Capillary Refill: Capillary refill takes less than 2 seconds.  Neurological:     Mental Status: Nicolas [name] is alert and oriented to person, place, and time.     ED Results / Procedures / Treatments   Labs (all labs ordered are listed, but only abnormal results are displayed) Labs Reviewed  COMPREHENSIVE METABOLIC PANEL - Abnormal; Notable for the following components:      Result Value   Glucose, Bld 112 (*)    AST 61 (*)    ALT 47 (*)    All other components within normal limits  ETHANOL - Abnormal; Notable for the following components:   Alcohol, Ethyl (B) 139 (*)    All other components within normal limits  SALICYLATE LEVEL - Abnormal; Notable for the following components:   Salicylate Lvl <7.0 (*)    All other components within normal limits  ACETAMINOPHEN LEVEL - Abnormal; Notable for the following components:   Acetaminophen (Tylenol), Serum <10 (*)    All other components within normal limits  RESPIRATORY PANEL BY RT PCR (FLU A&B, COVID)  RAPID URINE DRUG SCREEN, HOSP PERFORMED  LIPASE, BLOOD  CBC WITH DIFFERENTIAL/PLATELET    EKG None  Radiology No  results found.  Procedures Procedures (including critical care time)  Medications Ordered in ED Medications  alum & mag hydroxide-simeth (MAALOX/MYLANTA) 200-200-20 MG/5ML suspension 30 mL (30 mLs Oral Given 10/18/19 1503)    And  lidocaine (XYLOCAINE) 2 % viscous mouth solution 15 mL (15 mLs Oral Refused 10/18/19 1504)  acetaminophen (TYLENOL) tablet 650 mg (650 mg Oral Refused 10/18/19 1504)  LORazepam (ATIVAN) injection 0-4 mg ( Intravenous See Alternative 10/18/19 1802)  Or  LORazepam (ATIVAN) tablet 0-4 mg (1 mg Oral Given 10/18/19 1802)  LORazepam (ATIVAN) injection 0-4 mg (has no administration in time range)    Or  LORazepam (ATIVAN) tablet 0-4 mg (has no administration in time range)  thiamine tablet 100 mg (100 mg Oral Given 10/18/19 1802)    Or  thiamine (B-1) injection 100 mg ( Intravenous See Alternative 10/18/19 1802)  ondansetron (ZOFRAN-ODT) disintegrating tablet 4 mg (4 mg Oral Given 10/18/19 1459)  LORazepam (ATIVAN) tablet 1 mg (1 mg Oral Given 10/18/19 1636)  pantoprazole (PROTONIX) EC tablet 20 mg (20 mg Oral Given 10/18/19 1956)    ED Course  I have reviewed the triage vital signs and the nursing notes.  Pertinent labs & imaging results that were available during my care of the patient were reviewed by me and considered in my medical decision making (see chart for details).    MDM Rules/Calculators/A&P                      Patient presenting for evaluation of suicidal thoughts and abdominal pain.  On exam, patient is tenderness of the upper abdomen.  No rigidity or signs of peritonitis.  Likely gastritis, however considering his history will need labs to rule out pancreatitis and acute liver injury/failure.  Exam is not consistent with perforation, and if labs are reassuring I do not believe he needs CT.  Will treat symptomatically with Zofran and GI cocktail.  Labs interpreted by me, overall reassuring.  Electrolytes are stable. No leukocytosis. lft's mildly elevated. Ethanol  elevated at 139. Pt is not in complicated withdrawal or needing medical admission. He is cleared for tts.   Pt signed out to Burnett Corrente, PA-C for f/u on [facility] team recommendations.   The patient has been placed in psychiatric observation due to the need to provide a safe environment for the patient while obtaining psychiatric consultation and evaluation, as well as ongoing medical and medication management to treat the patient's condition.  The patient has not been placed under full IVC at this time.   Final Clinical Impression(s) / ED Diagnoses Final diagnoses:  None    Rx / DC Orders ED Discharge Orders    None       Alveria Apley, PA-C 10/18/19 2051    Alveria Apley, PA-C 10/18/19 2110    Linwood Dibbles, MD 10/19/19 0700

## 2019-10-18 NOTE — ED Notes (Signed)
Sitter arrival at bedside.

## 2019-10-18 NOTE — ED Provider Notes (Addendum)
Care assumed from PA Caccavale at shift change, please see her note for full details, but in brief Nicolas [name] is a 54 y.o. adult who states he no longer wants to live, and this is why he is in the ER. He states he will not kill himself, but he doesn't want to be alive. He states he started drinking again, and is struggling with this.    Patient has been seen multiple times for the same.  Lab work shows ethanol level of 139 but otherwise without any significant abnormalities.  Patient is medically cleared pending TTS evaluation.  Plan: Follow-up on behavioral health recommendations.  At this time patient is not IVC'd, and if he wishes to leave feel this is reasonable and he will not require IVC.  He has been seen for similar multiple times recently.  Labs Reviewed  COMPREHENSIVE METABOLIC PANEL - Abnormal; Notable for the following components:      Result Value   Glucose, Bld 112 (*)    AST 61 (*)    ALT 47 (*)    All other components within normal limits  ETHANOL - Abnormal; Notable for the following components:   Alcohol, Ethyl (B) 139 (*)    All other components within normal limits  SALICYLATE LEVEL - Abnormal; Notable for the following components:   Salicylate Lvl <7.0 (*)    All other components within normal limits  ACETAMINOPHEN LEVEL - Abnormal; Notable for the following components:   Acetaminophen (Tylenol), Serum <10 (*)    All other components within normal limits  RESPIRATORY PANEL BY RT PCR (FLU A&B, COVID)  RAPID URINE DRUG SCREEN, HOSP PERFORMED  LIPASE, BLOOD  CBC WITH DIFFERENTIAL/PLATELET    TTS is seen and evaluated patient, they do not feel he meets inpatient criteria, and I recommended that he follow-up with outpatient resources.  Provided patient with information for [facility] walk-in hours and substance abuse treatment options.  Patient is ambulatory, tolerating p.o. and stable for discharge    Nicolas [name] 10/19/19 0009    Nicolas Bale,  MD 10/19/19 [phone]

## 2019-10-19 NOTE — Discharge Instructions (Addendum)
° °  [facility] °[address] °[place], [place] [address] °[phone] °Description: °A variety of behavioral health services are offered, including: °Open Access  °Outpatient Therapy & Psychiatric Services  °Assertive Community Treatment Team (ACTT) (adults only)  °Crisis Assessment Services Center (children & adults)  °Assertive Engagement (children & adults)  °Peer Support Services °Referrals may be made to the facility-based crisis center 24/7, 365 days a year. Walk-ins are always welcome or you may call ahead to speak with a [facility] staff member regarding availability (preferred method). ° °

## 2019-10-19 NOTE — ED Notes (Signed)
All belongings given back to patient. Pt verifies same.

## 2019-10-20 ENCOUNTER — Encounter (HOSPITAL_COMMUNITY): Payer: Self-pay | Admitting: Emergency Medicine

## 2019-10-20 ENCOUNTER — Inpatient Hospital Stay (HOSPITAL_COMMUNITY)
Admission: EM | Admit: 2019-10-20 | Discharge: 2019-10-22 | DRG: 439 | Discharge status: Against Medical Advice | Payer: 59 | Attending: Internal Medicine | Admitting: Internal Medicine

## 2019-10-20 ENCOUNTER — Emergency Department (HOSPITAL_COMMUNITY): Payer: 59

## 2019-10-20 DIAGNOSIS — F332 Major depressive disorder, recurrent severe without psychotic features: Secondary | ICD-10-CM | POA: Diagnosis present

## 2019-10-20 DIAGNOSIS — Z590 Homelessness: Secondary | ICD-10-CM

## 2019-10-20 DIAGNOSIS — R45851 Suicidal ideations: Secondary | ICD-10-CM | POA: Diagnosis present

## 2019-10-20 DIAGNOSIS — Z79899 Other long term (current) drug therapy: Secondary | ICD-10-CM | POA: Diagnosis not present

## 2019-10-20 DIAGNOSIS — Y905 Blood alcohol level of 100-119 mg/100 ml: Secondary | ICD-10-CM | POA: Diagnosis present

## 2019-10-20 DIAGNOSIS — F17210 Nicotine dependence, cigarettes, uncomplicated: Secondary | ICD-10-CM | POA: Diagnosis present

## 2019-10-20 DIAGNOSIS — K852 Alcohol induced acute pancreatitis without necrosis or infection: Principal | ICD-10-CM | POA: Diagnosis present

## 2019-10-20 DIAGNOSIS — K859 Acute pancreatitis without necrosis or infection, unspecified: Secondary | ICD-10-CM | POA: Diagnosis present

## 2019-10-20 DIAGNOSIS — Z20822 Contact with and (suspected) exposure to covid-19: Secondary | ICD-10-CM | POA: Diagnosis present

## 2019-10-20 DIAGNOSIS — Z5329 Procedure and treatment not carried out because of patient's decision for other reasons: Secondary | ICD-10-CM | POA: Diagnosis present

## 2019-10-20 DIAGNOSIS — I10 Essential (primary) hypertension: Secondary | ICD-10-CM | POA: Diagnosis present

## 2019-10-20 DIAGNOSIS — F10239 Alcohol dependence with withdrawal, unspecified: Secondary | ICD-10-CM | POA: Diagnosis present

## 2019-10-20 DIAGNOSIS — R739 Hyperglycemia, unspecified: Secondary | ICD-10-CM | POA: Diagnosis present

## 2019-10-20 DIAGNOSIS — K5900 Constipation, unspecified: Secondary | ICD-10-CM | POA: Diagnosis present

## 2019-10-20 DIAGNOSIS — Z91048 Other nonmedicinal substance allergy status: Secondary | ICD-10-CM | POA: Diagnosis not present

## 2019-10-20 DIAGNOSIS — F10229 Alcohol dependence with intoxication, unspecified: Secondary | ICD-10-CM | POA: Diagnosis present

## 2019-10-20 LAB — COMPREHENSIVE METABOLIC PANEL
ALT: 60 U/L — ABNORMAL HIGH (ref 0–44)
AST: 109 U/L — ABNORMAL HIGH (ref 15–41)
Albumin: 4.2 g/dL (ref 3.5–5.0)
Alkaline Phosphatase: 74 U/L (ref 38–126)
Anion gap: 11 (ref 5–15)
BUN: 18 mg/dL (ref 6–20)
CO2: 23 mmol/L (ref 22–32)
Calcium: 8.8 mg/dL — ABNORMAL LOW (ref 8.9–10.3)
Chloride: 99 mmol/L (ref 98–111)
Creatinine, Ser: 0.94 mg/dL (ref 0.61–1.24)
GFR calc Af Amer: >60 mL/min (ref >60)
GFR calc non Af Amer: >60 mL/min (ref >60)
Glucose, Bld: 159 mg/dL — ABNORMAL HIGH (ref 70–99)
Potassium: 4.5 mmol/L (ref 3.5–5.1)
Sodium: 133 mmol/L — ABNORMAL LOW (ref 135–145)
Total Bilirubin: 0.8 mg/dL (ref 0.3–1.2)
Total Protein: 6.9 g/dL (ref 6.5–8.1)

## 2019-10-20 LAB — CBC
HCT: 46.4 % (ref 39.0–52.0)
Hemoglobin: 16.2 g/dL (ref 13.0–17.0)
MCH: 34.2 pg — ABNORMAL HIGH (ref 26.0–34.0)
MCHC: 34.9 g/dL (ref 30.0–36.0)
MCV: 97.9 fL (ref 80.0–100.0)
Platelets: 267 10*3/uL (ref 150–400)
RBC: 4.74 MIL/uL (ref 4.22–5.81)
RDW: 11.9 % (ref 11.5–15.5)
WBC: 8 10*3/uL (ref 4.0–10.5)
nRBC: 0 % (ref 0.0–0.2)

## 2019-10-20 LAB — RAPID URINE DRUG SCREEN, HOSP PERFORMED
Amphetamines: NOT DETECTED
Barbiturates: NOT DETECTED
Benzodiazepines: NOT DETECTED
Cocaine: NOT DETECTED
Opiates: NOT DETECTED
Tetrahydrocannabinol: NOT DETECTED

## 2019-10-20 LAB — ACETAMINOPHEN LEVEL: Acetaminophen (Tylenol), Serum: <10 ug/mL — ABNORMAL LOW (ref 10–30)

## 2019-10-20 LAB — SALICYLATE LEVEL: Salicylate Lvl: <7 mg/dL — ABNORMAL LOW (ref 7.0–30.0)

## 2019-10-20 LAB — RESPIRATORY PANEL BY RT PCR (FLU A&B, COVID)
Influenza A by PCR: NEGATIVE
Influenza B by PCR: NEGATIVE
SARS Coronavirus 2 by RT PCR: NEGATIVE

## 2019-10-20 LAB — PHOSPHORUS: Phosphorus: 2.7 mg/dL (ref 2.5–4.6)

## 2019-10-20 LAB — LIPASE, BLOOD: Lipase: 71 U/L — ABNORMAL HIGH (ref 11–51)

## 2019-10-20 LAB — ETHANOL: Alcohol, Ethyl (B): 100 mg/dL — ABNORMAL HIGH (ref <10)

## 2019-10-20 LAB — MAGNESIUM: Magnesium: 1.9 mg/dL (ref 1.7–2.4)

## 2019-10-20 MED ORDER — LORAZEPAM 2 MG/ML IJ SOLN
0.0000 mg | Freq: Four times a day (QID) | INTRAMUSCULAR | Status: DC
  Administered 2019-10-20: 1 mg via INTRAVENOUS
  Administered 2019-10-20 – 2019-10-21 (×2): 2 mg via INTRAVENOUS
  Filled 2019-10-20 (×3): qty 1

## 2019-10-20 MED ORDER — LORAZEPAM 2 MG/ML IJ SOLN: 0.0000 mg | Freq: Two times a day (BID) | INTRAMUSCULAR | Status: DC

## 2019-10-20 MED ORDER — HYDROXYZINE HCL 25 MG PO TABS
25.0000 mg | ORAL_TABLET | Freq: Every evening | ORAL | Status: DC | PRN
  Administered 2019-10-21: 25 mg via ORAL
  Filled 2019-10-20: qty 1

## 2019-10-20 MED ORDER — FOLIC ACID 1 MG PO TABS: 1.0000 mg | ORAL_TABLET | Freq: Every day | ORAL | Status: DC

## 2019-10-20 MED ORDER — ACETAMINOPHEN 650 MG RE SUPP: 650.0000 mg | Freq: Four times a day (QID) | RECTAL | Status: DC | PRN

## 2019-10-20 MED ORDER — ESTRADIOL 0.1 MG/24HR TD PTWK
0.1000 mg | MEDICATED_PATCH | TRANSDERMAL | Status: DC
  Administered 2019-10-21: 0.1 mg via TRANSDERMAL
  Filled 2019-10-20 (×2): qty 1

## 2019-10-20 MED ORDER — ADULT MULTIVITAMIN W/MINERALS CH
1.0000 | ORAL_TABLET | Freq: Every day | ORAL | Status: DC
  Administered 2019-10-22: 1 via ORAL
  Filled 2019-10-20: qty 1

## 2019-10-20 MED ORDER — HYDROMORPHONE HCL 1 MG/ML IJ SOLN
0.5000 mg | Freq: Once | INTRAMUSCULAR | Status: AC
  Administered 2019-10-20: 0.5 mg via INTRAVENOUS
  Filled 2019-10-20: qty 1

## 2019-10-20 MED ORDER — LORAZEPAM 1 MG PO TABS
0.0000 mg | ORAL_TABLET | Freq: Four times a day (QID) | ORAL | Status: AC
  Administered 2019-10-21: 2 mg via ORAL
  Administered 2019-10-22: 1 mg via ORAL
  Filled 2019-10-20: qty 1
  Filled 2019-10-20: qty 2

## 2019-10-20 MED ORDER — HYDROMORPHONE HCL 1 MG/ML IJ SOLN
1.0000 mg | Freq: Once | INTRAMUSCULAR | Status: AC
  Administered 2019-10-20: 1 mg via INTRAVENOUS
  Filled 2019-10-20: qty 1

## 2019-10-20 MED ORDER — THIAMINE HCL 100 MG PO TABS
100.0000 mg | ORAL_TABLET | Freq: Every day | ORAL | Status: DC
  Administered 2019-10-22: 100 mg via ORAL
  Filled 2019-10-20: qty 1

## 2019-10-20 MED ORDER — ONDANSETRON HCL 4 MG/2ML IJ SOLN
4.0000 mg | Freq: Three times a day (TID) | INTRAMUSCULAR | Status: DC | PRN
  Administered 2019-10-20 – 2019-10-22 (×2): 4 mg via INTRAVENOUS
  Filled 2019-10-20 (×2): qty 2

## 2019-10-20 MED ORDER — LACTATED RINGERS IV BOLUS
1000.0000 mL | Freq: Once | INTRAVENOUS | Status: AC
  Administered 2019-10-20: 1000 mL via INTRAVENOUS

## 2019-10-20 MED ORDER — SPIRONOLACTONE 25 MG PO TABS
50.0000 mg | ORAL_TABLET | Freq: Two times a day (BID) | ORAL | Status: DC
  Administered 2019-10-20: 50 mg via ORAL
  Filled 2019-10-20 (×2): qty 2

## 2019-10-20 MED ORDER — LORAZEPAM 1 MG PO TABS: 0.0000 mg | ORAL_TABLET | Freq: Two times a day (BID) | ORAL | Status: DC

## 2019-10-20 MED ORDER — ADULT MULTIVITAMIN W/MINERALS CH: 1.0000 | ORAL_TABLET | Freq: Every day | ORAL | Status: DC

## 2019-10-20 MED ORDER — MELATONIN 3 MG PO TABS
3.0000 mg | ORAL_TABLET | Freq: Every day | ORAL | Status: DC
  Administered 2019-10-20 – 2019-10-21 (×2): 3 mg via ORAL
  Filled 2019-10-20 (×3): qty 1

## 2019-10-20 MED ORDER — THIAMINE HCL 100 MG PO TABS: 100.0000 mg | ORAL_TABLET | Freq: Every day | ORAL | Status: DC

## 2019-10-20 MED ORDER — LORAZEPAM 2 MG/ML IJ SOLN
0.0000 mg | Freq: Four times a day (QID) | INTRAMUSCULAR | Status: AC
  Administered 2019-10-20: 1 mg via INTRAVENOUS
  Administered 2019-10-21 (×2): 2 mg via INTRAVENOUS
  Administered 2019-10-22: 1 mg via INTRAVENOUS
  Filled 2019-10-20 (×3): qty 1

## 2019-10-20 MED ORDER — IOHEXOL 300 MG/ML  SOLN
100.0000 mL | Freq: Once | INTRAMUSCULAR | Status: AC | PRN
  Administered 2019-10-20: 100 mL via INTRAVENOUS

## 2019-10-20 MED ORDER — HALOPERIDOL LACTATE 5 MG/ML IJ SOLN
2.0000 mg | Freq: Once | INTRAMUSCULAR | Status: AC
  Administered 2019-10-20: 2 mg via INTRAVENOUS
  Filled 2019-10-20: qty 1

## 2019-10-20 MED ORDER — FAMOTIDINE IN NACL 20-0.9 MG/50ML-% IV SOLN
20.0000 mg | Freq: Once | INTRAVENOUS | Status: AC
  Administered 2019-10-20: 20 mg via INTRAVENOUS
  Filled 2019-10-20: qty 50

## 2019-10-20 MED ORDER — THIAMINE HCL 100 MG/ML IJ SOLN
100.0000 mg | Freq: Every day | INTRAMUSCULAR | Status: DC
  Administered 2019-10-20: 100 mg via INTRAVENOUS
  Filled 2019-10-20: qty 2

## 2019-10-20 MED ORDER — BISMUTH SUBSALICYLATE 262 MG/15ML PO SUSP
30.0000 mL | Freq: Four times a day (QID) | ORAL | Status: DC | PRN
  Filled 2019-10-20: qty 236

## 2019-10-20 MED ORDER — THIAMINE HCL 100 MG/ML IJ SOLN
INTRAVENOUS | Status: DC
  Filled 2019-10-20 (×2): qty 1000

## 2019-10-20 MED ORDER — LORAZEPAM 2 MG/ML IJ SOLN
1.0000 mg | INTRAMUSCULAR | Status: DC | PRN
  Administered 2019-10-20: 2 mg via INTRAVENOUS
  Administered 2019-10-22: 1 mg via INTRAVENOUS
  Filled 2019-10-20 (×3): qty 1

## 2019-10-20 MED ORDER — SODIUM CHLORIDE 0.9 % IV BOLUS
1000.0000 mL | Freq: Once | INTRAVENOUS | Status: AC
  Administered 2019-10-20: 1000 mL via INTRAVENOUS

## 2019-10-20 MED ORDER — KETOROLAC TROMETHAMINE 30 MG/ML IJ SOLN
30.0000 mg | Freq: Four times a day (QID) | INTRAMUSCULAR | Status: DC | PRN
  Administered 2019-10-20: 30 mg via INTRAVENOUS
  Filled 2019-10-20: qty 1

## 2019-10-20 MED ORDER — POLYETHYLENE GLYCOL 3350 17 G PO PACK
17.0000 g | PACK | Freq: Every day | ORAL | Status: DC | PRN
  Administered 2019-10-20: 17 g via ORAL
  Filled 2019-10-20: qty 1

## 2019-10-20 MED ORDER — NICOTINE 21 MG/24HR TD PT24
21.0000 mg | MEDICATED_PATCH | Freq: Every day | TRANSDERMAL | Status: DC
  Administered 2019-10-20 – 2019-10-22 (×3): 21 mg via TRANSDERMAL
  Filled 2019-10-20 (×3): qty 1

## 2019-10-20 MED ORDER — THIAMINE HCL 100 MG/ML IJ SOLN: 100.0000 mg | Freq: Every day | INTRAMUSCULAR | Status: DC

## 2019-10-20 MED ORDER — RISAQUAD PO CAPS
1.0000 | ORAL_CAPSULE | Freq: Every day | ORAL | Status: DC
  Administered 2019-10-20 – 2019-10-22 (×3): 1 via ORAL
  Filled 2019-10-20 (×3): qty 1

## 2019-10-20 MED ORDER — OXYCODONE HCL 5 MG PO TABS
5.0000 mg | ORAL_TABLET | ORAL | Status: DC | PRN
  Administered 2019-10-20 – 2019-10-21 (×6): 5 mg via ORAL
  Filled 2019-10-20 (×6): qty 1

## 2019-10-20 MED ORDER — FOLIC ACID 1 MG PO TABS
1.0000 mg | ORAL_TABLET | Freq: Every day | ORAL | Status: DC
  Administered 2019-10-22: 1 mg via ORAL
  Filled 2019-10-20: qty 1

## 2019-10-20 MED ORDER — LORAZEPAM 1 MG PO TABS
1.0000 mg | ORAL_TABLET | ORAL | Status: DC | PRN
  Administered 2019-10-21: 1 mg via ORAL
  Administered 2019-10-21: 4 mg via ORAL
  Administered 2019-10-22: 1 mg via ORAL
  Filled 2019-10-20: qty 4
  Filled 2019-10-20 (×2): qty 1

## 2019-10-20 MED ORDER — ACETAMINOPHEN 325 MG PO TABS: 650.0000 mg | ORAL_TABLET | Freq: Four times a day (QID) | ORAL | Status: DC | PRN

## 2019-10-20 NOTE — ED Notes (Signed)
Dinner Tray Ordered @ 1800. 

## 2019-10-20 NOTE — H&P (Addendum)
History and Physical    Nicolas [name] QQV:[id number] DOB: 1965-08-22 DOA: 10/20/2019  PCP: Nicolas Heron, MD   Patient coming from: Home  I have personally briefly reviewed patient's old medical records in [facility]  Chief Complaint: Abd pain, shaky  HPI: Nicolas [name] is a 54 y.o. 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 Patient moved from [place] to [place] recently, but has been homeless. Overall, he felt depressed and had several occasions expressed suicidal ideas and was evaluated by psy in ED two days ago and considered not meeting inpatient criteria and sent home. Last two days has been drinking heavily but has gradually started to feel abdominal pain epigastrically located, and seems to radiate to the back.  Worse with eating or drinking.  Became constant, 10/10, unable to eat. Denies fever.  One episode of nonbloody nonbilious emesis this morning.  No chest pain or shortness of breath. He has been constipated for 3 days.   ED Course: Lipase 71, CT abd: Pancreatic and peripancreatic edema of the head and uncinate process of the pancreas. The findings are most consistent with acute Pancreatitis. Elevated EtOH level, and pt started to have withdrawal symptoms in the ED.  Review of Systems: As per HPI otherwise 10 point review of systems negative.    Past Medical History:  Diagnosis Date  . Alcohol abuse     Past Surgical History:  Procedure Laterality Date  . NASAL SEPTUM SURGERY       reports that Nicolas [name] has been smoking cigarettes. Nicolas [name] has been smoking about 0.50 packs per day. Nicolas [name] has never used smokeless tobacco. Nicolas [name] reports current alcohol use of about 6.0 standard drinks of alcohol per week. Nicolas [name] reports previous drug use. Drug: Cocaine.  Allergies  Allergen Reactions  .  Caffeine Palpitations    No family history on file.   Prior to Admission medications   Medication Sig Start Date End Date Taking? Authorizing Provider  atenolol (TENORMIN) 25 MG tablet Take 25 mg by mouth daily. 08/31/19  Yes [provider]  bismuth subsalicylate (PEPTO BISMOL) 262 MG/15ML suspension Take 30 mLs by mouth every 6 (six) hours as needed for diarrhea or loose stools.   Yes [provider]  estradiol (CLIMARA - DOSED IN MG/24 HR) 0.1 mg/24hr patch Place 0.1 mg onto the skin once a week. 10/06/19  Yes [provider]  hydrOXYzine (ATARAX/VISTARIL) 25 MG tablet Take 25 mg by mouth at bedtime as needed for anxiety (sleep).  06/27/19  Yes [provider]  MELATONIN PO Take 2 tablets by mouth at bedtime.   Yes [provider]  Multiple Vitamin (MULTIVITAMIN WITH MINERALS) TABS tablet Take 1 tablet by mouth daily.   Yes [provider]  Probiotic Product (PROBIOTIC PO) Take 1 capsule by mouth daily.   Yes [provider]  spironolactone (ALDACTONE) 50 MG tablet Take 50 mg by mouth 2 (two) times daily. 08/22/19  Yes [provider]  thiamine 100 MG tablet Take 1 tablet (100 mg total) by mouth daily. Patient not taking: Reported on 10/18/2019 10/16/19   Nicolas Berkshire, MD    Physical Exam: Vitals:   10/20/19 1056 10/20/19 1300 10/20/19 1349 10/20/19 1400  BP: 109/77 119/80 117/84 (!) 123/91  Pulse: 65 62 65 68  Resp: 16 12  (!) 21  Temp: 98.4 F (36.9 C)  TempSrc: Oral     SpO2: 97% 94%  97%  Weight: 80.7 kg     Height: 5\' 9"  (1.753 m)       Constitutional: NAD, calm, comfortable Vitals:   10/20/19 1056 10/20/19 1300 10/20/19 1349 10/20/19 1400  BP: 109/77 119/80 117/84 (!) 123/91  Pulse: 65 62 65 68  Resp: 16 12  (!) 21  Temp: 98.4 F (36.9 C)     TempSrc: Oral     SpO2: 97% 94%  97%  Weight: 80.7 kg     Height: 5\' 9"  (1.753 m)      Eyes: PERRL, lids and conjunctivae normal ENMT: Mucous membranes  are moist. Posterior pharynx clear of any exudate or lesions.Normal dentition.  Neck: normal, supple, no masses, no thyromegaly Respiratory: clear to auscultation bilaterally, no wheezing, no crackles. Normal respiratory effort. No accessory muscle use.  Cardiovascular: Regular rate and rhythm, no murmurs / rubs / gallops. No extremity edema. 2+ pedal pulses. No carotid bruits.  Abdomen: Mild tenderness on palpation on epigastric area, no rebound, no guarding, no masses palpated. No hepatosplenomegaly. Bowel sounds positive.  Musculoskeletal: no clubbing / cyanosis. No joint deformity upper and lower extremities. Good ROM, no contractures. Normal muscle tone. Fine tremors on the finger tips Skin: no rashes, lesions, ulcers. No induration Neurologic: CN 2-12 grossly intact. Sensation intact, DTR normal. Strength 5/5 in all 4.  Psychiatric: Normal judgment and insight. Alert and oriented x 3. Normal mood.     Labs on Admission: I have personally reviewed following labs and imaging studies  CBC: Recent Labs  Lab 10/15/19 1754 10/18/19 1521 10/20/19 1110  WBC 6.1 8.1 8.0  NEUTROABS  --  4.2  --   HGB 14.5 15.6 16.2  HCT 41.7 44.9 46.4  MCV 97.9 97.6 97.9  PLT 240 259 998   Basic Metabolic Panel: Recent Labs  Lab 10/15/19 1754 10/18/19 1521 10/20/19 1110  NA 140 137 133*  K 3.4* 4.1 4.5  CL 102 101 99  CO2 28 25 23   GLUCOSE 118* 112* 159*  BUN 9 14 18   CREATININE 0.76 0.80 0.94  CALCIUM 10.1 10.0 8.8*   GFR: Estimated Creatinine Clearance (by C-G formula based on SCr of 0.94 mg/dL) Male: 80.5 mL/min Male: 93 mL/min Liver Function Tests: Recent Labs  Lab 10/15/19 1754 10/18/19 1521 10/20/19 1110  AST 52* 61* 109*  ALT 42 47* 60*  ALKPHOS 63 68 74  BILITOT 0.9 1.0 0.8  PROT 6.8 6.9 6.9  ALBUMIN 4.1 4.3 4.2   Recent Labs  Lab 10/15/19 1754 10/18/19 1521 10/20/19 1110  LIPASE 20 17 71*   No results for input(s): AMMONIA in the last 168 hours. Coagulation  Profile: No results for input(s): INR, PROTIME in the last 168 hours. Cardiac Enzymes: No results for input(s): CKTOTAL, CKMB, CKMBINDEX, TROPONINI in the last 168 hours. BNP (last 3 results) No results for input(s): PROBNP in the last 8760 hours. HbA1C: No results for input(s): HGBA1C in the last 72 hours. CBG: No results for input(s): GLUCAP in the last 168 hours. Lipid Profile: No results for input(s): CHOL, HDL, LDLCALC, TRIG, CHOLHDL, LDLDIRECT in the last 72 hours. Thyroid Function Tests: No results for input(s): TSH, T4TOTAL, FREET4, T3FREE, THYROIDAB in the last 72 hours. Anemia Panel: No results for input(s): VITAMINB12, FOLATE, FERRITIN, TIBC, IRON, RETICCTPCT in the last 72 hours. Urine analysis:    Component Value Date/Time   COLORURINE COLORLESS (A) 10/15/2019 1845   APPEARANCEUR CLEAR 10/15/2019 1845  LABSPEC 1.002 (L) 10/15/2019 1845   PHURINE 7.0 10/15/2019 1845   GLUCOSEU NEGATIVE 10/15/2019 1845   HGBUR NEGATIVE 10/15/2019 1845   BILIRUBINUR NEGATIVE 10/15/2019 1845   KETONESUR NEGATIVE 10/15/2019 1845   PROTEINUR NEGATIVE 10/15/2019 1845   NITRITE NEGATIVE 10/15/2019 1845   LEUKOCYTESUR NEGATIVE 10/15/2019 1845    Radiological Exams on Admission: CT ABDOMEN PELVIS W CONTRAST  Result Date: 10/20/2019 CLINICAL DATA:  Severe epigastric pain. EXAM: CT ABDOMEN AND PELVIS WITH CONTRAST TECHNIQUE: Multidetector CT imaging of the abdomen and pelvis was performed using the standard protocol following bolus administration of intravenous contrast. CONTRAST:  OMNIPAQUE IOHEXOL 300 MG/ML  SOLN COMPARISON:  None. FINDINGS: Lower chest: No acute abnormality. Hepatobiliary: No focal liver abnormality is seen. No gallstones, gallbladder wall thickening, or biliary dilatation. Pancreas: Pancreatic and peripancreatic edema in the head and uncinate process of the pancreas. The body and tail of the pancreas have normal appearance. 1.5 cm cystic structure within the head of the  pancreas peripherally may represent a pseudocyst or cystic pancreatic neoplasm. Spleen: Normal in size without focal abnormality. Adrenals/Urinary Tract: Adrenal glands are unremarkable. Kidneys are normal, without renal calculi, focal lesion, or hydronephrosis. Bladder is unremarkable. Stomach/Bowel: Normal appearance of the stomach. Inflammatory changes of the second and third portions of the duodenum, likely reactive. No evidence of small-bowel obstruction. Normal appearance of the colon and appendix. Vascular/Lymphatic: No significant vascular findings are present. No enlarged abdominal or pelvic lymph nodes. Shotty peripancreatic lymph nodes, not pathologic by CT criteria. Reproductive: Prostate is unremarkable. Other: No abdominal wall hernia or abnormality. No abdominopelvic ascites. Musculoskeletal: No acute or significant osseous findings. IMPRESSION: 1. Pancreatic and peripancreatic edema of the head and uncinate process of the pancreas. The findings are most consistent with acute pancreatitis. 2. 1.5 cm cystic structure within the head of the pancreas peripherally may represent a small pseudocyst or cystic pancreatic neoplasm. Re-evaluation upon resolution of the acute symptoms is recommended. 3. Inflammatory changes of the second and third portions of the duodenum, likely reactive. Electronically Signed   By: Ted Mcalpine M.D.   On: 10/20/2019 12:49    EKG: Independently reviewed. Left axis deviation  Assessment/Plan Active Problems:   Alcoholic pancreatitis   Acute pancreatitis  Acute recurrent pancreatitis, 2nd to EtOH abuse -Trial of clears -IVF with banana bag -Stratify pain meds -Consult transition care team  EtOH withdrawal -Actively withdrawing now, will start Ativan taper -Pt interested in EtOH Rehab  Pancreatic cyst Repeat imaging in few months to document stabilization and outpt GI f/u  Elevated Glucose Check A1C  Hx of HTN BP WNL, PRN BP meds  only  Transaminitis Likely from EtOH abuse, trend LFTs  Suicidal ideation Was just evaluated by psy two days ago, no significant change, re-evaluate in AM when more sober.   DVT prophylaxis: SCD Code Status: Full Code Family Communication: None Disposition Plan: Likely will need 1-2 days of hospital stay for pain management Consults called: None Admission status: Tele admit   Nicolas General MD [facility] Pager [phone]    10/20/2019, 2:07 PM

## 2019-10-20 NOTE — ED Triage Notes (Signed)
[place] via [facility] [place] motel with c/o abd pain, was seen 5/7 at [facility] long for the same and diagnosed with gastritis r/t alcoholism. Ems vss. Pt endorses alcohol use this morning, states he drank some malt liquor. When asked about SI, pt states "yea, I just want the pain to stop, if I had a gun I would put it to my head." pt states he just wants to go to a treatment center but his insurance wont cover it, he also endorses being between housing at this time.

## 2019-10-20 NOTE — ED Provider Notes (Signed)
[facility]-EMERGENCY DEPT [facility] Emergency Department Provider Note MRN:  [id number]  Arrival date & time: 10/20/19     Chief Complaint   Abdominal Pain and Alcohol Problem   History of Present Illness   Nicolas [name] is a 54 y.o. year-old adult with a history of alcohol abuse presenting to the ED with chief complaint of abdominal pain.  Patient has been having issues with abdominal pain for the past several days.  Became much worse this morning.  Pain is in the epigastrium and seems to radiate to the back.  Worse with eating or drinking.  Denies fever.  1 or 2 episodes of nonbloody nonbilious emesis this morning.  No chest pain or shortness of breath, no lower abdominal pain, no blood in the stool.  Symptoms are severe, constant.  Patient also has been struggling with depression and alcohol dependence and feels that he would kill himself if he had access to a gun.  Review of Systems  A complete 10 system review of systems was obtained and all systems are negative except as noted in the HPI and PMH.   Patient's Health History    Past Medical History:  Diagnosis Date  . Alcohol abuse     Past Surgical History:  Procedure Laterality Date  . NASAL SEPTUM SURGERY      No family history on file.  Social History   Socioeconomic History  . Marital status: Single    Spouse name: Not on file  . Number of children: Not on file  . Years of education: Not on file  . Highest education level: Not on file  Occupational History  . Not on file  Tobacco Use  . Smoking status: Current Every Day Smoker    Packs/day: 0.50    Types: Cigarettes  . Smokeless tobacco: Never Used  Substance and Sexual Activity  . Alcohol use: Yes    Alcohol/week: 6.0 standard drinks    Types: 6 Cans of beer per week    Comment: daily use  . Drug use: Not Currently    Types: Cocaine    Comment: stopped 01/2019  . Sexual activity: Not on file  Other Topics Concern  . Not on file  Social History  Narrative  . Not on file   Social Determinants of Health   Financial Resource Strain:   . Difficulty of Paying Living Expenses:   Food Insecurity:   . Worried About Programme researcher, broadcasting/film/video in the Last Year:   . Barista in the Last Year:   Transportation Needs:   . Freight forwarder (Medical):   Marland Kitchen Lack of Transportation (Non-Medical):   Physical Activity:   . Days of Exercise per Week:   . Minutes of Exercise per Session:   Stress:   . Feeling of Stress :   Social Connections:   . Frequency of Communication with Friends and Family:   . Frequency of Social Gatherings with Friends and Family:   . Attends Religious Services:   . Active Member of Clubs or Organizations:   . Attends Banker Meetings:   Marland Kitchen Marital Status:   Intimate Partner Violence:   . Fear of Current or Ex-Partner:   . Emotionally Abused:   Marland Kitchen Physically Abused:   . Sexually Abused:      Physical Exam   Vitals:   10/20/19 1056 10/20/19 1300  BP: 109/77 119/80  Pulse: 65 62  Resp: 16 12  Temp: 98.4 F (36.9 C)  SpO2: 97% 94%    CONSTITUTIONAL: Well-appearing, pacing the room due to pain/distress NEURO:  Alert and oriented x 3, no focal deficits EYES:  eyes equal and reactive ENT/NECK:  no LAD, no JVD CARDIO: Regular rate, well-perfused, normal S1 and S2 PULM:  CTAB no wheezing or rhonchi GI/GU:  normal bowel sounds, non-distended, moderate epigastric tenderness MSK/SPINE:  No gross deformities, no edema SKIN:  no rash, atraumatic PSYCH:  Appropriate speech and behavior  *Additional and/or pertinent findings included in MDM below  Diagnostic and Interventional Summary    EKG Interpretation  Date/Time:  Sunday Oct 20 2019 11:53:15 EDT Ventricular Rate:  67 PR Interval:    QRS Duration: 108 QT Interval:  415 QTC Calculation: 439 R Axis:   -16 Text Interpretation: Sinus rhythm Borderline left axis deviation Baseline wander in lead(s) V1 Confirmed by Kennis Carina [phone])  on 10/20/2019 12:04:50 PM      Labs Reviewed  COMPREHENSIVE METABOLIC PANEL - Abnormal; Notable for the following components:      Result Value   Sodium 133 (*)    Glucose, Bld 159 (*)    Calcium 8.8 (*)    AST 109 (*)    ALT 60 (*)    All other components within normal limits  ETHANOL - Abnormal; Notable for the following components:   Alcohol, Ethyl (B) 100 (*)    All other components within normal limits  SALICYLATE LEVEL - Abnormal; Notable for the following components:   Salicylate Lvl <7.0 (*)    All other components within normal limits  ACETAMINOPHEN LEVEL - Abnormal; Notable for the following components:   Acetaminophen (Tylenol), Serum <10 (*)    All other components within normal limits  CBC - Abnormal; Notable for the following components:   MCH 34.2 (*)    All other components within normal limits  LIPASE, BLOOD - Abnormal; Notable for the following components:   Lipase 71 (*)    All other components within normal limits  RESPIRATORY PANEL BY RT PCR (FLU A&B, COVID)  RAPID URINE DRUG SCREEN, HOSP PERFORMED    CT ABDOMEN PELVIS W CONTRAST  Final Result      Medications  lactated ringers bolus 1,000 mL (has no administration in time range)  LORazepam (ATIVAN) injection 0-4 mg (has no administration in time range)    Or  LORazepam (ATIVAN) tablet 0-4 mg (has no administration in time range)  LORazepam (ATIVAN) injection 0-4 mg (has no administration in time range)    Or  LORazepam (ATIVAN) tablet 0-4 mg (has no administration in time range)  thiamine tablet 100 mg (has no administration in time range)    Or  thiamine (B-1) injection 100 mg (has no administration in time range)  haloperidol lactate (HALDOL) injection 2 mg (2 mg Intravenous Given 10/20/19 1135)  famotidine (PEPCID) IVPB 20 mg premix (0 mg Intravenous Stopped 10/20/19 1221)  HYDROmorphone (DILAUDID) injection 1 mg (1 mg Intravenous Given 10/20/19 1219)  sodium chloride 0.9 % bolus 1,000 mL (1,000 mLs  Intravenous New Bag/Given 10/20/19 1221)  iohexol (OMNIPAQUE) 300 MG/ML solution 100 mL (100 mLs Intravenous Contrast Given 10/20/19 1227)     Procedures  /  Critical Care Procedures  ED Course and Medical Decision Making  I have reviewed the triage vital signs, the nursing notes, and pertinent available records from the EMR.  Listed above are laboratory and imaging tests that I personally ordered, reviewed, and interpreted and then considered in my medical decision making (see below for details).  Considering alcoholic gastritis, pancreatitis, less likely biliary etiology, also considering alcoholic hepatitis, doubt perforated viscus.  Will attempt symptomatic control so that I can obtain a more dependable exam as patient is pacing the room and unwilling to lay in bed for an abdominal exam.  Patient with continued pain despite Ativan and Pepcid, moderately tender to the epigastrium, pain radiates to the back, lipase mildly elevated.  CT obtained to confirm the diagnosis of pancreatitis and exclude other more serious pathologies.  CT confirms pancreatitis with a small likely pseudocyst.  Patient's pain has returned despite Dilaudid, will admit to hospitalist service for further care.  Barth Kirks. [place] Small, [place] [email or web address]  Final Clinical Impressions(s) / ED Diagnoses     ICD-10-CM   1. Alcohol-induced acute pancreatitis, unspecified complication status  D03.01     ED Discharge Orders    None       Discharge Instructions Discussed with and Provided to Patient:   Discharge Instructions   None       Maudie Flakes, MD 10/20/19 1340

## 2019-10-20 NOTE — ED Notes (Signed)
Pt returned from CT, resting quietly with eyes closed, opens eyes to voice, answers questions appropriately.  Gave sip of water.

## 2019-10-21 DIAGNOSIS — K852 Alcohol induced acute pancreatitis without necrosis or infection: Principal | ICD-10-CM

## 2019-10-21 DIAGNOSIS — F332 Major depressive disorder, recurrent severe without psychotic features: Secondary | ICD-10-CM

## 2019-10-21 LAB — COMPREHENSIVE METABOLIC PANEL
ALT: 56 U/L — ABNORMAL HIGH (ref 0–44)
AST: 97 U/L — ABNORMAL HIGH (ref 15–41)
Albumin: 3.4 g/dL — ABNORMAL LOW (ref 3.5–5.0)
Alkaline Phosphatase: 60 U/L (ref 38–126)
Anion gap: 8 (ref 5–15)
BUN: 10 mg/dL (ref 6–20)
CO2: 23 mmol/L (ref 22–32)
Calcium: 8.1 mg/dL — ABNORMAL LOW (ref 8.9–10.3)
Chloride: 103 mmol/L (ref 98–111)
Creatinine, Ser: 0.8 mg/dL (ref 0.61–1.24)
GFR calc Af Amer: >60 mL/min (ref >60)
GFR calc non Af Amer: >60 mL/min (ref >60)
Glucose, Bld: 89 mg/dL (ref 70–99)
Potassium: 3.6 mmol/L (ref 3.5–5.1)
Sodium: 134 mmol/L — ABNORMAL LOW (ref 135–145)
Total Bilirubin: 1.3 mg/dL — ABNORMAL HIGH (ref 0.3–1.2)
Total Protein: 5.5 g/dL — ABNORMAL LOW (ref 6.5–8.1)

## 2019-10-21 LAB — CBC
HCT: 39.7 % (ref 39.0–52.0)
Hemoglobin: 13.6 g/dL (ref 13.0–17.0)
MCH: 33.4 pg (ref 26.0–34.0)
MCHC: 34.3 g/dL (ref 30.0–36.0)
MCV: 97.5 fL (ref 80.0–100.0)
Platelets: 183 10*3/uL (ref 150–400)
RBC: 4.07 MIL/uL — ABNORMAL LOW (ref 4.22–5.81)
RDW: 11.9 % (ref 11.5–15.5)
WBC: 10.4 10*3/uL (ref 4.0–10.5)
nRBC: 0 % (ref 0.0–0.2)

## 2019-10-21 MED ORDER — ENOXAPARIN SODIUM 40 MG/0.4ML ~~LOC~~ SOLN
40.0000 mg | SUBCUTANEOUS | Status: DC
  Administered 2019-10-21: 40 mg via SUBCUTANEOUS
  Filled 2019-10-21: qty 0.4

## 2019-10-21 MED ORDER — SODIUM CHLORIDE 0.9 % IV SOLN: INTRAVENOUS | Status: DC

## 2019-10-21 MED ORDER — PANTOPRAZOLE SODIUM 40 MG IV SOLR
40.0000 mg | Freq: Two times a day (BID) | INTRAVENOUS | Status: DC
  Administered 2019-10-21 – 2019-10-22 (×3): 40 mg via INTRAVENOUS
  Filled 2019-10-21 (×3): qty 40

## 2019-10-21 NOTE — Progress Notes (Signed)
Patient is upset because he can not locate his car.  This nurse called [facility] where patient claimed he was staying and was told they give the residents parking permits.  Patient stated they did not give him a parking permit.  Patient stated he wanted to be transferred to a psychiatric unit but not at [facility].  Patient complaining we have all his belongings and treating him like he is in jail.  Patient will not allow this nurse to give him any Ativan as indicated by patients CIWA score.

## 2019-10-21 NOTE — Consult Note (Signed)
[facility] Face-to-Face Psychiatry Consult   Reason for Consult:  Suicidal ideations, depression, and alcohol dependence Referring Physician:  Dr Carmell Austria Patient Identification: Nicolas [name] MRN:  [id number] Principal Diagnosis: Alcoholic pancreatitis Diagnosis:  Principal Problem:   Alcoholic pancreatitis Active Problems:   Acute pancreatitis   Major depressive disorder, recurrent episode, severe (HCC)   Total Time spent with patient: 1 hour  Subjective:   Nicolas [name] is a 54 y.o. adult patient admitted with pancreatitis r/t alcohol use, consult for depression and suicidal ideations.  Patient seen and evaluated in person by this provider.  Client goes by Nicolas [name] and is currently transgender range from male to male.  He reports feeling "crappy."  He recently moved from [place], [place] to be with her husband which did not work out.  He moved from his apartment to hotel within 2 living with a friend was recently kicked out.  He is concerned about his car being at the [employer] and being stolen.  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  When the stressors head he returned to drinking alcohol which ended up causing pancreatitis and hospitalization.  Prior to hospitalization he did try to get assistance for his drinking with rehab which his insurance will not cover.  He currently feels at the end of his rope and feels the only way out is by killing himself.  The client illustrates this by putting his fingers to his head symbolize and a gun.  He sees no other way out.  No homicidal ideations, hallucinations, mania symptoms, or paranoia.  Multiple stressors and losses make him high risk and psychiatric hospitalization is recommended after medical clearance, see treatment recommendations below.  HPI per MD:  Nicolas [name] is a 54 y.o. adult with medical history significant of alcohol abuse, transgender M to F on hormone manipulation, recurrent  pancreatitis,presenting to the ED with chief complaint of abdominal pain. Patient moved from [place] to [place] recently, but has been homeless. Overall, he felt depressed and had several occasions expressed suicidal ideas and was evaluated by psy in ED two days ago and considered not meeting inpatient criteria and sent home. Last two days has been drinking heavily but has gradually started to feel abdominal pain epigastrically located, and seems to radiate to the back. Worse with eating or drinking. Became constant, 10/10, unable to eat. Denies fever. One episode of nonbloody nonbilious emesis this morning. No chest pain or shortness of breath. He has been constipated for 3 days.   Past Psychiatric History: alcohol dependence, depression  Risk to Self:  yes Risk to Others:  none Prior Inpatient Therapy:  yes, [place] Prior Outpatient Therapy:  yes in [place]  Past Medical History:  Past Medical History:  Diagnosis Date  . Alcohol abuse     Past Surgical History:  Procedure Laterality Date  . NASAL SEPTUM SURGERY     Family History: No family history on file. Family Psychiatric  History:  none Social History:  Social History   Substance and Sexual Activity  Alcohol Use Yes  . Alcohol/week: 6.0 standard drinks  . Types: 6 Cans of beer per week   Comment: daily use     Social History   Substance and Sexual Activity  Drug Use Not Currently  . Types: Cocaine   Comment: stopped 01/2019    Social History   Socioeconomic History  . Marital status: Single    Spouse name: Not on file  . Number  of children: Not on file  . Years of education: Not on file  . Highest education level: Not on file  Occupational History  . Not on file  Tobacco Use  . Smoking status: Current Every Day Smoker    Packs/day: 0.50    Types: Cigarettes  . Smokeless tobacco: Never Used  Substance and Sexual Activity  . Alcohol use: Yes    Alcohol/week: 6.0 standard drinks    Types: 6 Cans of beer  per week    Comment: daily use  . Drug use: Not Currently    Types: Cocaine    Comment: stopped 01/2019  . Sexual activity: Not on file  Other Topics Concern  . Not on file  Social History Narrative  . Not on file   Social Determinants of Health   Financial Resource Strain:   . Difficulty of Paying Living Expenses:   Food Insecurity:   . Worried About Programme researcher, broadcasting/film/video in the Last Year:   . Barista in the Last Year:   Transportation Needs:   . Freight forwarder (Medical):   Marland Kitchen Lack of Transportation (Non-Medical):   Physical Activity:   . Days of Exercise per Week:   . Minutes of Exercise per Session:   Stress:   . Feeling of Stress :   Social Connections:   . Frequency of Communication with Friends and Family:   . Frequency of Social Gatherings with Friends and Family:   . Attends Religious Services:   . Active Member of Clubs or Organizations:   . Attends Banker Meetings:   Marland Kitchen Marital Status:    Additional Social History:    Allergies:   Allergies  Allergen Reactions  . Caffeine Palpitations    Labs:  Results for orders placed or performed during the hospital encounter of 10/20/19 (from the past 48 hour(s))  Comprehensive metabolic panel     Status: Abnormal   Collection Time: 10/20/19 11:10 AM  Result Value Ref Range   Sodium 133 (L) 135 - 145 mmol/L   Potassium 4.5 3.5 - 5.1 mmol/L   Chloride 99 98 - 111 mmol/L   CO2 23 22 - 32 mmol/L   Glucose, Bld 159 (H) 70 - 99 mg/dL    Comment: Glucose reference range applies only to samples taken after fasting for at least 8 hours.   BUN 18 6 - 20 mg/dL   Creatinine, Ser 1.47 0.61 - 1.24 mg/dL   Calcium 8.8 (L) 8.9 - 10.3 mg/dL   Total Protein 6.9 6.5 - 8.1 g/dL   Albumin 4.2 3.5 - 5.0 g/dL   AST 829 (H) 15 - 41 U/L   ALT 60 (H) 0 - 44 U/L   Alkaline Phosphatase 74 38 - 126 U/L   Total Bilirubin 0.8 0.3 - 1.2 mg/dL   GFR calc non Af Amer >60 >60 mL/min   GFR calc Af Amer >60 >60  mL/min   Anion gap 11 5 - 15    Comment: Performed at [facility], [address], [place], [place] [address]  Ethanol     Status: Abnormal   Collection Time: 10/20/19 11:10 AM  Result Value Ref Range   Alcohol, Ethyl (B) 100 (H) <10 mg/dL    Comment: (NOTE) Lowest detectable limit for serum alcohol is 10 mg/dL. For medical purposes only. Performed at [facility], [address] [place], [place] [address]   Salicylate level     Status: Abnormal  Collection Time: 10/20/19 11:10 AM  Result Value Ref Range   Salicylate Lvl <7.0 (L) 7.0 - 30.0 mg/dL    Comment: Performed at [facility], [address] [place], [place] [address]  Acetaminophen level     Status: Abnormal   Collection Time: 10/20/19 11:10 AM  Result Value Ref Range   Acetaminophen (Tylenol), Serum <10 (L) 10 - 30 ug/mL    Comment: (NOTE) Therapeutic concentrations vary significantly. A range of 10-30 ug/mL  may be an effective concentration for many patients. However, some  are best treated at concentrations outside of this range. Acetaminophen concentrations >150 ug/mL at 4 hours after ingestion  and >50 ug/mL at 12 hours after ingestion are often associated with  toxic reactions. Performed at [facility], [address] [place], [place] [address]   cbc     Status: Abnormal   Collection Time: 10/20/19 11:10 AM  Result Value Ref Range   WBC 8.0 4.0 - 10.5 K/uL   RBC 4.74 4.22 - 5.81 MIL/uL   Hemoglobin 16.2 13.0 - 17.0 g/dL   HCT 99.2 42.6 - 83.4 %   MCV 97.9 80.0 - 100.0 fL   MCH 34.2 (H) 26.0 - 34.0 pg   MCHC 34.9 30.0 - 36.0 g/dL   RDW 19.6 22.2 - 97.9 %   Platelets 267 150 - 400 K/uL   nRBC 0.0 0.0 - 0.2 %    Comment: Performed at [facility], [address], [place], [place] [address]  Lipase, blood     Status: Abnormal   Collection Time: 10/20/19 11:10 AM  Result Value Ref Range   Lipase 71 (H) 11 - 51 U/L    Comment: Performed at [facility], [address] [place], [place] [address]  Rapid urine drug screen (hospital performed)     Status: None   Collection Time: 10/20/19  1:33 PM  Result Value Ref Range   Opiates NONE DETECTED NONE DETECTED   Cocaine NONE DETECTED NONE DETECTED   Benzodiazepines NONE DETECTED NONE DETECTED   Amphetamines NONE DETECTED NONE DETECTED   Tetrahydrocannabinol NONE DETECTED NONE DETECTED   Barbiturates NONE DETECTED NONE DETECTED    Comment: (NOTE) DRUG SCREEN FOR MEDICAL PURPOSES ONLY.  IF CONFIRMATION IS NEEDED FOR ANY PURPOSE, NOTIFY LAB WITHIN 5 DAYS. LOWEST DETECTABLE LIMITS FOR URINE DRUG SCREEN Drug Class                     Cutoff (ng/mL) Amphetamine and metabolites    1000 Barbiturate and metabolites    200 Benzodiazepine                 200 Tricyclics and metabolites     300 Opiates and metabolites        300 Cocaine and metabolites        300 THC                            50 Performed at [facility], [address], [place], [place] [address]   Respiratory Panel by RT PCR (Flu A&B, Covid) - Nasopharyngeal Swab     Status: None   Collection Time: 10/20/19  2:01 PM   Specimen: Nasopharyngeal Swab  Result Value Ref Range   SARS Coronavirus 2 by RT PCR NEGATIVE NEGATIVE    Comment: (NOTE) SARS-CoV-2 target nucleic acids are NOT DETECTED. The SARS-CoV-2 RNA is generally  detectable in upper respiratoy specimens during the acute phase of infection. The lowest concentration of SARS-CoV-2 viral copies this assay can detect is 131 copies/mL. A negative result does not preclude SARS-Cov-2 infection and should not be used as the sole basis for treatment or other patient management decisions. A negative result may occur with  improper specimen collection/handling, submission of specimen other than nasopharyngeal swab, presence of viral mutation(s) within the areas targeted by this assay, and inadequate number of viral copies (<131 copies/mL). A negative result must be combined with  clinical observations, patient history, and epidemiological information. The expected result is Negative. Fact Sheet for Patients:  https://www.moore.com/ Fact Sheet for Healthcare Providers:  https://www.young.biz/ This test is not yet ap proved or cleared by the [place] FDA and  has been authorized for detection and/or diagnosis of SARS-CoV-2 by FDA under an Emergency Use Authorization (EUA). This EUA will remain  in effect (meaning this test can be used) for the duration of the COVID-19 declaration under Section 564(b)(1) of the Act, 21 U.S.C. section 360bbb-3(b)(1), unless the authorization is terminated or revoked sooner.    Influenza A by PCR NEGATIVE NEGATIVE   Influenza B by PCR NEGATIVE NEGATIVE    Comment: (NOTE) The Xpert Xpress SARS-CoV-2/FLU/RSV assay is intended as an aid in  the diagnosis of influenza from Nasopharyngeal swab specimens and  should not be used as a sole basis for treatment. Nasal washings and  aspirates are unacceptable for Xpert Xpress SARS-CoV-2/FLU/RSV  testing. Fact Sheet for Patients: https://www.moore.com/ Fact Sheet for Healthcare Providers: https://www.young.biz/ This test is not yet approved or cleared by the [place] FDA and  has been authorized for detection and/or diagnosis of SARS-CoV-2 by  FDA under an Emergency Use Authorization (EUA). This EUA will remain  in effect (meaning this test can be used) for the duration of the  Covid-19 declaration under Section 564(b)(1) of the Act, 21  U.S.C. section 360bbb-3(b)(1), unless the authorization is  terminated or revoked. Performed at [facility], [address], [place], [place] [address]   Magnesium     Status: None   Collection Time: 10/20/19  3:17 PM  Result Value Ref Range   Magnesium 1.9 1.7 - 2.4 mg/dL    Comment: Performed at [facility], [address] [place], [place] [address]   Phosphorus     Status: None   Collection Time: 10/20/19  3:17 PM  Result Value Ref Range   Phosphorus 2.7 2.5 - 4.6 mg/dL    Comment: Performed at [facility], [address] [place], [place] [address]  Comprehensive metabolic panel     Status: Abnormal   Collection Time: 10/21/19  2:54 AM  Result Value Ref Range   Sodium 134 (L) 135 - 145 mmol/L   Potassium 3.6 3.5 - 5.1 mmol/L   Chloride 103 98 - 111 mmol/L   CO2 23 22 - 32 mmol/L   Glucose, Bld 89 70 - 99 mg/dL    Comment: Glucose reference range applies only to samples taken after fasting for at least 8 hours.   BUN 10 6 - 20 mg/dL   Creatinine, Ser 9.14 0.61 - 1.24 mg/dL   Calcium 8.1 (L) 8.9 - 10.3 mg/dL   Total Protein 5.5 (L) 6.5 - 8.1 g/dL   Albumin 3.4 (L) 3.5 - 5.0 g/dL   AST 97 (H) 15 - 41 U/L   ALT 56 (H) 0 - 44 U/L   Alkaline Phosphatase 60 38 -  126 U/L   Total Bilirubin 1.3 (H) 0.3 - 1.2 mg/dL   GFR calc non Af Amer >60 >60 mL/min   GFR calc Af Amer >60 >60 mL/min   Anion gap 8 5 - 15    Comment: Performed at [facility], [address] [place], [place] [address]  CBC     Status: Abnormal   Collection Time: 10/21/19  2:54 AM  Result Value Ref Range   WBC 10.4 4.0 - 10.5 K/uL   RBC 4.07 (L) 4.22 - 5.81 MIL/uL   Hemoglobin 13.6 13.0 - 17.0 g/dL   HCT 60.439.7 54.039.0 - 98.152.0 %   MCV 97.5 80.0 - 100.0 fL   MCH 33.4 26.0 - 34.0 pg   MCHC 34.3 30.0 - 36.0 g/dL   RDW 19.111.9 47.811.5 - 29.515.5 %   Platelets 183 150 - 400 K/uL   nRBC 0.0 0.0 - 0.2 %    Comment: Performed at [facility], [address] [place], [place] [address]    Current Facility-Administered Medications  Medication Dose Route Frequency Provider Last Rate Last Admin  . 0.9 %  sodium chloride infusion   Intravenous Continuous Regalado, Belkys A, MD 125 mL/hr at 10/21/19 0924 New Bag at 10/21/19 0924  . acetaminophen (TYLENOL) tablet 650 mg  650 mg Oral Q6H PRN Mikey CollegeZhang, Ping T, MD       Or  . acetaminophen (TYLENOL) suppository 650 mg  650  mg Rectal Q6H PRN Mikey CollegeZhang, Ping T, MD      . acidophilus (RISAQUAD) capsule 1 capsule  1 capsule Oral Daily Emeline GeneralZhang, Ping T, MD   1 capsule at 10/21/19 0931  . bismuth subsalicylate (PEPTO BISMOL) 262 MG/15ML suspension 30 mL  30 mL Oral Q6H PRN Mikey CollegeZhang, Ping T, MD      . enoxaparin (LOVENOX) injection 40 mg  40 mg Subcutaneous Q24H Regalado, Belkys A, MD      . estradiol (CLIMARA - Dosed in mg/24 hr) patch 0.1 mg  0.1 mg Transdermal Weekly Mikey CollegeZhang, Ping T, MD   0.1 mg at 10/21/19 1136  . [START ON 10/22/2019] folic acid (FOLVITE) tablet 1 mg  1 mg Oral Daily Mikey CollegeZhang, Ping T, MD      . hydrOXYzine (ATARAX/VISTARIL) tablet 25 mg  25 mg Oral QHS PRN Emeline GeneralZhang, Ping T, MD   25 mg at 10/21/19 0158  . ketorolac (TORADOL) 30 MG/ML injection 30 mg  30 mg Intravenous Q6H PRN Mikey CollegeZhang, Ping T, MD   30 mg at 10/20/19 1734  . LORazepam (ATIVAN) injection 0-4 mg  0-4 mg Intravenous Q6H Sabas SousBero, Michael M, MD   2 mg at 10/21/19 0920   Or  . LORazepam (ATIVAN) tablet 0-4 mg  0-4 mg Oral Q6H Sabas SousBero, Michael M, MD      . Melene Muller[START ON 10/22/2019] LORazepam (ATIVAN) injection 0-4 mg  0-4 mg Intravenous Q12H Sabas SousBero, Michael M, MD       Or  . Melene Muller[START ON 10/22/2019] LORazepam (ATIVAN) tablet 0-4 mg  0-4 mg Oral Q12H Sabas SousBero, Michael M, MD      . LORazepam (ATIVAN) tablet 1-4 mg  1-4 mg Oral Q1H PRN Emeline GeneralZhang, Ping T, MD   4 mg at 10/21/19 1231   Or  . LORazepam (ATIVAN) injection 1-4 mg  1-4 mg Intravenous Q1H PRN Mikey CollegeZhang, Ping T, MD   2 mg at 10/20/19 2341  . melatonin tablet 3 mg  3 mg Oral QHS Mikey CollegeZhang, Ping T, MD   3 mg at 10/20/19  2135  . [START ON 10/22/2019] multivitamin with minerals tablet 1 tablet  1 tablet Oral Daily Wynetta Fines T, MD      . nicotine (NICODERM CQ - dosed in mg/24 hours) patch 21 mg  21 mg Transdermal Daily Wynetta Fines T, MD   21 mg at 10/21/19 0935  . ondansetron (ZOFRAN) injection 4 mg  4 mg Intravenous Q8H PRN Lang Snow, FNP   4 mg at 10/20/19 2341  . oxyCODONE (Oxy IR/ROXICODONE) immediate release tablet 5 mg  5 mg Oral Q4H  PRN Wynetta Fines T, MD   5 mg at 10/21/19 0954  . pantoprazole (PROTONIX) injection 40 mg  40 mg Intravenous Q12H Regalado, Belkys A, MD   40 mg at 10/21/19 1106  . polyethylene glycol (MIRALAX / GLYCOLAX) packet 17 g  17 g Oral Daily PRN Wynetta Fines T, MD   17 g at 10/20/19 2135  . [START ON 10/22/2019] thiamine tablet 100 mg  100 mg Oral Daily Wynetta Fines T, MD       Or  . Derrill Memo ON 10/22/2019] thiamine (B-1) injection 100 mg  100 mg Intravenous Daily Lequita Halt, MD        Musculoskeletal: Strength & Muscle Tone: decreased Gait & Station: did not witness Patient leans: N/A  Psychiatric Specialty Exam: Physical Exam  Nursing note and vitals reviewed. Constitutional: Nicolas [name] is oriented to person, place, and time. Nicolas [name] appears well-developed and well-nourished.  HENT:  Head: Normocephalic.  Respiratory: Effort normal.  Musculoskeletal:        General: Normal range of motion.     Cervical back: Normal range of motion.  Neurological: Nicolas [name] is alert and oriented to person, place, and time.  Psychiatric: Nicolas [name]'s speech is normal and behavior is normal. Nicolas [name]'s mood appears anxious. Cognition and memory are normal. Nicolas [name] expresses impulsivity. Nicolas [name] exhibits a depressed mood. Nicolas [name] expresses suicidal ideation. Nicolas [name] expresses suicidal plans.    Review of Systems  Gastrointestinal: Positive for abdominal pain.  Psychiatric/Behavioral: Positive for dysphoric mood and suicidal ideas. The patient is nervous/anxious.   All other systems reviewed and are negative.   Blood pressure (!) 138/94, pulse 87, temperature 98.6 F (37 C), resp. rate 19, height 5\' 9"  (1.753 m), weight 80.7 kg, SpO2 95 %.Body mass index is 26.29 kg/m.  General Appearance: Casual  Eye Contact:  Fair  Speech:  Normal Rate  Volume:  Normal  Mood:  Anxious, Depressed and Irritable  Affect:  Congruent  Thought Process:  Coherent and Descriptions of  Associations: Intact  Orientation:  Full (Time, Place, and Person)  Thought Content:  Rumination  Suicidal Thoughts:  Yes.  with intent/plan  Homicidal Thoughts:  No  Memory:  Immediate;   Fair Recent;   Fair Remote;   Fair  Judgement:  Impaired  Insight:  Fair  Psychomotor Activity:  Decreased  Concentration:  Concentration: Fair and Attention Span: Fair  Recall:  Nicolas [name] of Knowledge:  Fair  Language:  Good  Akathisia:  No  Handed:  Right  AIMS (if indicated):     Assets:  Leisure Time Resilience  ADL's:  Intact  Cognition:  WNL  Sleep:        Treatment Plan Summary: Major depressive disorder, recurrent, severe without psychosis: -Recommend psychiatric hospitalization for stabilization after medical clearance -TOC consult placed to assist with coordination -Recommend starting Prozac 20 mg daily  Alcohol use disorder, severe: -Continue CIWA and Ativan alcohol detox protocol  Disposition: Recommend psychiatric Inpatient admission when medically cleared.  Nanine Means, NP 10/21/2019 1:29 PM

## 2019-10-21 NOTE — TOC Progression Note (Addendum)
Transition of Care (TOC) - Progression Note    Patient Details  Name: Nicolas [name] MRN: [id number] Date of Birth: 05/30/1966  Transition of Care (TOC) CM/SW Contact   P , LCSW Phone Number: 10/21/2019, 3:11 PM  Clinical Narrative:     CSW met with pt. For SA consult.  Pt is aggravated and is difficult to have discussion with. Pt explains she wants outpatient substance use services though later explains she wants detox. Pt attempts to find referral paper work in her bag with circled referrals but is unable to find. She asks if hospital has a service to bring her car from a hotel which CSW explains [facility] does not. CSW recommends calling a friend. CSW attempts to offer support; patient remained aggravated. CSW explained he would attempt to find previous recommendations and follow up later.        Expected Discharge Plan and Services                                                 Social Determinants of Health (SDOH) Interventions    Readmission Risk Interventions No flowsheet data found.  

## 2019-10-21 NOTE — Progress Notes (Signed)
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  Patient recently moved from [place] to [place].  He is homeless.  He has expressed suicidal ideation.  He was also recently evaluated in the ED 2 days ago for suicidal ideation and was clear for psych at that time. He presents complaining of abdominal pain. Evaluation in the ED: CT abdomen showed pancreatic and peripancreatic edema consistent with pancreatitis.  He was also noted to be in alcohol withdrawal.   Assessment & Plan:   Active Problems:   Alcoholic pancreatitis   Acute pancreatitis   1-Acute recurrent pancreatitis secondary to alcohol abuse: Denies worsening abdominal pain. Continue with clear diet Continue with IV fluids IV pain medication; toradol IV Protonix.    2-Alcohol withdrawal: Continue with CIWA protocol and Ativan. Thiamine and folic acid. Also work-up for alcohol rehab  3-Pancreatic cyst: Need follow-up as an outpatient 4-Suicidal ideation: Psych consulted sitter at bedside 5-hyperglycemia: Mild.  Monitor. 6-transaminases: Hyperbilirubinemia: Secondary to alcohol abuse      Estimated body mass index is 26.29 kg/m as calculated from the following:   Height as of this encounter: 5\' 9"  (1.753 m).   Weight as of this encounter: 80.7 kg.   DVT prophylaxis: Lovenox Code Status: Full code Family Communication: Care discussed with patient Disposition Plan:  Patient is from: Homeless Anticipated d/c date: 2 or 3 days when pancreatitis resolved and symptoms of alcohol withdrawal resolved Dissipate discharge to: Shelter Barriers to d/c or necessity for inpatient status: Continue with IV Ativan for alcohol withdrawal and treatment of acute  pancreatitis Consultants:   Psych  Procedures:   None  Antimicrobials:  None  Subjective: He report improvement of abdominal pain, he has been tolerating clear diet.  Report pain 6 out of 10.  Objective: Vitals:   10/20/19 1900 10/20/19 1930 10/20/19 2034 10/20/19 2334  BP: (!) 141/96 129/88 (!) 127/95 125/85  Pulse: 80 62 72 70  Resp: (!) 32 14  18  Temp:   98.4 F (36.9 C) 98.3 F (36.8 C)  TempSrc:   Oral   SpO2: 96% 96% 98% 98%  Weight:      Height:   5\' 9"  (1.753 m)     Intake/Output Summary (Last 24 hours) at 10/21/2019 0750 Last data filed at 10/21/2019 0052 Gross per 24 hour  Intake 3262 ml  Output 1100 ml  Net 2162 ml   Filed Weights   10/20/19 1056  Weight: 80.7 kg    Examination:  General exam: Appears calm and comfortable  Respiratory system: Clear to auscultation. Respiratory effort normal. Cardiovascular system: S1 & S2 heard, RRR. No JVD, murmurs, rubs, gallops or clicks. No pedal edema. Gastrointestinal system: Abdomen is nondistended, soft and nontender. No organomegaly or masses felt. Normal bowel sounds heard. Central nervous system: Alert and oriented. No focal neurological deficits. Extremities: Symmetric 5 x 5 power. Skin: No rashes, lesions or ulcers    Data Reviewed: I have personally reviewed following labs and imaging studies  CBC: Recent Labs  Lab 10/15/19 1754 10/18/19 1521 10/20/19 1110 10/21/19 0254  WBC 6.1 8.1 8.0 10.4  NEUTROABS  --  4.2  --   --   HGB 14.5 15.6 16.2 13.6  HCT 41.7 44.9 46.4 39.7  MCV 97.9 97.6 97.9  97.5  PLT 240 259 267 732   Basic Metabolic Panel: Recent Labs  Lab 10/15/19 1754 10/18/19 1521 10/20/19 1110 10/20/19 1517 10/21/19 0254  NA 140 137 133*  --  134*  K 3.4* 4.1 4.5  --  3.6  CL 102 101 99  --  103  CO2 28 25 23   --  23  GLUCOSE 118* 112* 159*  --  89  BUN 9 14 18   --  10  CREATININE 0.76 0.80 0.94  --  0.80  CALCIUM 10.1 10.0 8.8*  --  8.1*  MG  --   --   --  1.9  --     PHOS  --   --   --  2.7  --    GFR: Estimated Creatinine Clearance (by C-G formula based on SCr of 0.8 mg/dL) Male: 94.6 mL/min Male: 109.2 mL/min Liver Function Tests: Recent Labs  Lab 10/15/19 1754 10/18/19 1521 10/20/19 1110 10/21/19 0254  AST 52* 61* 109* 97*  ALT 42 47* 60* 56*  ALKPHOS 63 68 74 60  BILITOT 0.9 1.0 0.8 1.3*  PROT 6.8 6.9 6.9 5.5*  ALBUMIN 4.1 4.3 4.2 3.4*   Recent Labs  Lab 10/15/19 1754 10/18/19 1521 10/20/19 1110  LIPASE 20 17 71*   No results for input(s): AMMONIA in the last 168 hours. Coagulation Profile: No results for input(s): INR, PROTIME in the last 168 hours. Cardiac Enzymes: No results for input(s): CKTOTAL, CKMB, CKMBINDEX, TROPONINI in the last 168 hours. BNP (last 3 results) No results for input(s): PROBNP in the last 8760 hours. HbA1C: No results for input(s): HGBA1C in the last 72 hours. CBG: No results for input(s): GLUCAP in the last 168 hours. Lipid Profile: No results for input(s): CHOL, HDL, LDLCALC, TRIG, CHOLHDL, LDLDIRECT in the last 72 hours. Thyroid Function Tests: No results for input(s): TSH, T4TOTAL, FREET4, T3FREE, THYROIDAB in the last 72 hours. Anemia Panel: No results for input(s): VITAMINB12, FOLATE, FERRITIN, TIBC, IRON, RETICCTPCT in the last 72 hours. Sepsis Labs: No results for input(s): PROCALCITON, LATICACIDVEN in the last 168 hours.  Recent Results (from the past 240 hour(s))  Respiratory Panel by RT PCR (Flu A&B, Covid) - Nasopharyngeal Swab     Status: None   Collection Time: 10/18/19  4:39 PM   Specimen: Nasopharyngeal Swab  Result Value Ref Range Status   SARS Coronavirus 2 by RT PCR NEGATIVE NEGATIVE Final    Comment: (NOTE) SARS-CoV-2 target nucleic acids are NOT DETECTED. The SARS-CoV-2 RNA is generally detectable in upper respiratoy specimens during the acute phase of infection. The lowest concentration of SARS-CoV-2 viral copies this assay can detect is 131 copies/mL. A negative  result does not preclude SARS-Cov-2 infection and should not be used as the sole basis for treatment or other patient management decisions. A negative result may occur with  improper specimen collection/handling, submission of specimen other than nasopharyngeal swab, presence of viral mutation(s) within the areas targeted by this assay, and inadequate number of viral copies (<131 copies/mL). A negative result must be combined with clinical observations, patient history, and epidemiological information. The expected result is Negative. Fact Sheet for Patients:  PinkCheek.be Fact Sheet for Healthcare Providers:  GravelBags.it This test is not yet ap proved or cleared by the [place] FDA and  has been authorized for detection and/or diagnosis of SARS-CoV-2 by FDA under an Emergency Use Authorization (EUA). This EUA will remain  in effect (meaning this test can be used) for the duration of  the COVID-19 declaration under Section 564(b)(1) of the Act, 21 U.S.C. section 360bbb-3(b)(1), unless the authorization is terminated or revoked sooner.    Influenza A by PCR NEGATIVE NEGATIVE Final   Influenza B by PCR NEGATIVE NEGATIVE Final    Comment: (NOTE) The Xpert Xpress SARS-CoV-2/FLU/RSV assay is intended as an aid in  the diagnosis of influenza from Nasopharyngeal swab specimens and  should not be used as a sole basis for treatment. Nasal washings and  aspirates are unacceptable for Xpert Xpress SARS-CoV-2/FLU/RSV  testing. Fact Sheet for Patients: https://www.moore.com/ Fact Sheet for Healthcare Providers: https://www.young.biz/ This test is not yet approved or cleared by the [place] FDA and  has been authorized for detection and/or diagnosis of SARS-CoV-2 by  FDA under an Emergency Use Authorization (EUA). This EUA will remain  in effect (meaning this test can be used) for the  duration of the  Covid-19 declaration under Section 564(b)(1) of the Act, 21  U.S.C. section 360bbb-3(b)(1), unless the authorization is  terminated or revoked. Performed at [facility], [address], [place], [place] [address]   Respiratory Panel by RT PCR (Flu A&B, Covid) - Nasopharyngeal Swab     Status: None   Collection Time: 10/20/19  2:01 PM   Specimen: Nasopharyngeal Swab  Result Value Ref Range Status   SARS Coronavirus 2 by RT PCR NEGATIVE NEGATIVE Final    Comment: (NOTE) SARS-CoV-2 target nucleic acids are NOT DETECTED. The SARS-CoV-2 RNA is generally detectable in upper respiratoy specimens during the acute phase of infection. The lowest concentration of SARS-CoV-2 viral copies this assay can detect is 131 copies/mL. A negative result does not preclude SARS-Cov-2 infection and should not be used as the sole basis for treatment or other patient management decisions. A negative result may occur with  improper specimen collection/handling, submission of specimen other than nasopharyngeal swab, presence of viral mutation(s) within the areas targeted by this assay, and inadequate number of viral copies (<131 copies/mL). A negative result must be combined with clinical observations, patient history, and epidemiological information. The expected result is Negative. Fact Sheet for Patients:  https://www.moore.com/ Fact Sheet for Healthcare Providers:  https://www.young.biz/ This test is not yet ap proved or cleared by the [place] FDA and  has been authorized for detection and/or diagnosis of SARS-CoV-2 by FDA under an Emergency Use Authorization (EUA). This EUA will remain  in effect (meaning this test can be used) for the duration of the COVID-19 declaration under Section 564(b)(1) of the Act, 21 U.S.C. section 360bbb-3(b)(1), unless the authorization is terminated or revoked sooner.    Influenza A by PCR  NEGATIVE NEGATIVE Final   Influenza B by PCR NEGATIVE NEGATIVE Final    Comment: (NOTE) The Xpert Xpress SARS-CoV-2/FLU/RSV assay is intended as an aid in  the diagnosis of influenza from Nasopharyngeal swab specimens and  should not be used as a sole basis for treatment. Nasal washings and  aspirates are unacceptable for Xpert Xpress SARS-CoV-2/FLU/RSV  testing. Fact Sheet for Patients: https://www.moore.com/ Fact Sheet for Healthcare Providers: https://www.young.biz/ This test is not yet approved or cleared by the [place] FDA and  has been authorized for detection and/or diagnosis of SARS-CoV-2 by  FDA under an Emergency Use Authorization (EUA). This EUA will remain  in effect (meaning this test can be used) for the duration of the  Covid-19 declaration under Section 564(b)(1) of the Act, 21  U.S.C. section 360bbb-3(b)(1), unless the authorization is  terminated or revoked. Performed at [facility], [address] [place], [place] [address]          Radiology Studies: CT ABDOMEN PELVIS W CONTRAST  Result Date: 10/20/2019 CLINICAL DATA:  Severe epigastric pain. EXAM: CT ABDOMEN AND PELVIS WITH CONTRAST TECHNIQUE: Multidetector CT imaging of the abdomen and pelvis was performed using the standard protocol following bolus administration of intravenous contrast. CONTRAST:  OMNIPAQUE IOHEXOL 300 MG/ML  SOLN COMPARISON:  None. FINDINGS: Lower chest: No acute abnormality. Hepatobiliary: No focal liver abnormality is seen. No gallstones, gallbladder wall thickening, or biliary dilatation. Pancreas: Pancreatic and peripancreatic edema in the head and uncinate process of the pancreas. The body and tail of the pancreas have normal appearance. 1.5 cm cystic structure within the head of the pancreas peripherally may represent a pseudocyst or cystic pancreatic neoplasm. Spleen: Normal in size without focal abnormality. Adrenals/Urinary  Tract: Adrenal glands are unremarkable. Kidneys are normal, without renal calculi, focal lesion, or hydronephrosis. Bladder is unremarkable. Stomach/Bowel: Normal appearance of the stomach. Inflammatory changes of the second and third portions of the duodenum, likely reactive. No evidence of small-bowel obstruction. Normal appearance of the colon and appendix. Vascular/Lymphatic: No significant vascular findings are present. No enlarged abdominal or pelvic lymph nodes. Shotty peripancreatic lymph nodes, not pathologic by CT criteria. Reproductive: Prostate is unremarkable. Other: No abdominal wall hernia or abnormality. No abdominopelvic ascites. Musculoskeletal: No acute or significant osseous findings. IMPRESSION: 1. Pancreatic and peripancreatic edema of the head and uncinate process of the pancreas. The findings are most consistent with acute pancreatitis. 2. 1.5 cm cystic structure within the head of the pancreas peripherally may represent a small pseudocyst or cystic pancreatic neoplasm. Re-evaluation upon resolution of the acute symptoms is recommended. 3. Inflammatory changes of the second and third portions of the duodenum, likely reactive. Electronically Signed   By: Ted Mcalpine M.D.   On: 10/20/2019 12:49        Scheduled Meds: . acidophilus  1 capsule Oral Daily  . estradiol  0.1 mg Transdermal Weekly  . [START ON 10/22/2019] folic acid  1 mg Oral Daily  . LORazepam  0-4 mg Intravenous Q6H   Or  . LORazepam  0-4 mg Oral Q6H  . [START ON 10/22/2019] LORazepam  0-4 mg Intravenous Q12H   Or  . [START ON 10/22/2019] LORazepam  0-4 mg Oral Q12H  . LORazepam  0-4 mg Intravenous Q6H   Followed by  . [START ON 10/22/2019] LORazepam  0-4 mg Intravenous Q12H  . melatonin  3 mg Oral QHS  . [START ON 10/22/2019] multivitamin with minerals  1 tablet Oral Daily  . nicotine  21 mg Transdermal Daily  . [START ON 10/22/2019] thiamine  100 mg Oral Daily   Or  . [START ON 10/22/2019] thiamine  100  mg Intravenous Daily   Continuous Infusions: . sodium chloride       LOS: 1 day    Time spent: 35 minutes.     Alba Cory, MD [facility]   If 7PM-7AM, please contact night-coverage [email or web address]  10/21/2019, 7:50 AM

## 2019-10-22 LAB — HIV ANTIBODY (ROUTINE TESTING W REFLEX): HIV Screen 4th Generation wRfx: NONREACTIVE

## 2019-10-22 MED ORDER — POLYETHYLENE GLYCOL 3350 17 G PO PACK: 17.0000 g | PACK | Freq: Every day | ORAL | Status: DC

## 2019-10-22 MED ORDER — SENNA 8.6 MG PO TABS
1.0000 | ORAL_TABLET | Freq: Two times a day (BID) | ORAL | Status: DC
  Administered 2019-10-22: 8.6 mg via ORAL
  Filled 2019-10-22: qty 1

## 2019-10-22 NOTE — Discharge Instructions (Signed)
Acute Pancreatitis  Acute pancreatitis happens when the pancreas gets swollen. The pancreas is a large gland in the body that helps to control blood sugar. It also makes enzymes that help to digest food. This condition can last a few days and cause serious problems. The lungs, heart, and kidneys may stop working. What are the causes? Causes include:  Alcohol abuse.  Drug abuse.  Gallstones.  A tumor in the pancreas. Other causes include:  Some medicines.  Some chemicals.  Diabetes.  An infection.  Damage caused by an accident.  The poison (venom) from a scorpion bite.  Belly (abdominal) surgery.  The body's defense system (immune system) attacking the pancreas (autoimmune pancreatitis).  Genes that are passed from parent to child (inherited). In some cases, the cause is not known. What are the signs or symptoms?  Pain in the upper belly that may be felt in the back. The pain may be very bad.  Swelling of the belly.  Feeling sick to your stomach (nauseous) and throwing up (vomiting).  Fever. How is this treated? You will likely have to stay in the hospital. Treatment may include:  Pain medicine.  Fluid through an IV tube.  Placing a tube in the stomach to take out the stomach contents. This may help you stop throwing up.  Not eating for 3-4 days.  Antibiotic medicines, if you have an infection.  Treating any other problems that may be the cause.  Steroid medicines, if your problem is caused by your defense system attacking your body's own tissues.  Surgery. Follow these instructions at home: Eating and drinking   Follow instructions from your doctor about what to eat and drink.  Eat foods that do not have a lot of fat in them.  Eat small meals often. Do not eat big meals.  Drink enough fluid to keep your pee (urine) pale yellow.  Do not drink alcohol if it caused your condition. Medicines  Take over-the-counter and prescription medicines only  as told by your doctor.  Ask your doctor if the medicine prescribed to you: ? Requires you to avoid driving or using heavy machinery. ? Can cause trouble pooping (constipation). You may need to take steps to prevent or treat trouble pooping:  Take over-the-counter or prescription medicines.  Eat foods that are high in fiber. These include beans, whole grains, and fresh fruits and vegetables.  Limit foods that are high in fat and sugar. These include fried or sweet foods. General instructions  Do not use any products that contain nicotine or tobacco, such as cigarettes, e-cigarettes, and chewing tobacco. If you need help quitting, ask your doctor.  Get plenty of rest.  Check your blood sugar at home as told by your doctor.  Keep all follow-up visits as told by your doctor. This is important. Contact a doctor if:  You do not get better as quickly as expected.  You have new symptoms.  Your symptoms get worse.  You have pain or weakness that lasts a long time.  You keep feeling sick to your stomach.  You get better and then you have pain again.  You have a fever. Get help right away if:  You cannot eat or keep fluids down.  Your pain gets very bad.  Your skin or the [place] part of your eyes turns yellow.  You have sudden swelling in your belly.  You throw up.  You feel dizzy or you pass out (faint).  Your blood sugar is high (over 300  mg/dL). Summary  Acute pancreatitis happens when the pancreas gets swollen.  This condition is often caused by alcohol abuse, drug abuse, or gallstones.  You will likely have to stay in the hospital for treatment. This information is not intended to replace advice given to you by your health care provider. Make sure you discuss any questions you have with your health care provider. Document Revised: 03/19/2018 Document Reviewed: 03/19/2018 Elsevier Patient Education  2020 [employer]   Pancreatitis Eating Plan Pancreatitis is  when your pancreas becomes irritated and swollen (inflamed). The pancreas is a small organ located behind your stomach. It helps your body digest food and regulate your blood sugar. Pancreatitis can affect how your body digests food, especially foods with fat. You may also have other symptoms such as abdominal pain or nausea. When you have pancreatitis, following a low-fat eating plan may help you manage symptoms and recover more quickly. Work with your health care provider or a diet and nutrition specialist (dietitian) to create an eating plan that is right for you. What are tips for following this plan? Reading food labels Use the information on food labels to help keep track of how much fat you eat:  Check the serving size.  Look for the amount of total fat in grams (g) in one serving. ? Low-fat foods have 3 g of fat or less per serving. ? Fat-free foods have 0.5 g of fat or less per serving.  Keep track of how much fat you eat based on how many servings you eat. ? For example, if you eat two servings, the amount of fat you eat will be two times what is listed on the label. Shopping   Buy low-fat or nonfat foods, such as: ? Fresh, frozen, or canned fruits and vegetables. ? Grains, including pasta, bread, and rice. ? Lean meat, poultry, fish, and other protein foods. ? Low-fat or nonfat dairy.  Avoid buying bakery products and other sweets made with whole milk, butter, and eggs.  Avoid buying snack foods with added fat, such as anything with butter or cheese flavoring. Cooking  Remove skin from poultry, and remove extra fat from meat.  Limit the amount of fat and oil you use to 6 teaspoons or less per day.  Cook using low-fat methods, such as boiling, broiling, grilling, steaming, or baking.  Use spray oil to cook. Add fat-free chicken broth to add flavor and moisture.  Avoid adding cream to thicken soups or sauces. Use other thickeners such as corn starch or tomato paste. Meal  planning   Eat a low-fat diet as told by your dietitian. For most people, this means having no more than 55-65 grams of fat each day.  Eat small, frequent meals throughout the day. For example, you may have 5-6 small meals instead of 3 large meals.  Drink enough fluid to keep your urine pale yellow.  Do not drink alcohol. Talk to your health care provider if you need help stopping.  Limit how much caffeine you have, including black coffee, black and green tea, caffeinated soft drinks, and energy drinks. General information  Let your health care provider or dietitian know if you have unplanned weight loss on this eating plan.  You may be instructed to follow a clear liquid diet during a flare of symptoms. Talk with your health care provider about how to manage your diet during symptoms of a flare.  Take any vitamins or supplements as told by your health care provider.  Work   with a dietitian, especially if you have other conditions such as obesity or diabetes mellitus. What foods should I avoid? Fruits Fried fruits. Fruits served with butter or cream. Vegetables Fried vegetables. Vegetables cooked with butter, cheese, or cream. Grains Biscuits, waffles, donuts, pastries, and croissants. Pies and cookies. Butter-flavored popcorn. Regular crackers. Meats and other protein foods Fatty cuts of meat. Poultry with skin. Organ meats. Bacon, sausage, and cold cuts. Whole eggs. Nuts and nut butters. Dairy Whole and 2% milk. Whole milk yogurt. Whole milk ice cream. Cream and half-and-half. Cream cheese. Sour cream. Cheese. Beverages Wine, beer, and liquor. The items listed above may not be a complete list of foods and beverages to avoid. Contact a dietitian for more information. Summary  Pancreatitis can affect how your body digests food, especially foods with fat.  When you have pancreatitis, it is recommended that you follow a low-fat eating plan to help you recover more quickly and  manage symptoms. For most people, this means limiting fat to no more than 55-65 grams per day.  Do not drink alcohol. Limit the amount of caffeine you have, and drink enough fluid to keep your urine pale yellow. This information is not intended to replace advice given to you by your health care provider. Make sure you discuss any questions you have with your health care provider. Document Revised: 09/20/2018 Document Reviewed: 09/05/2017 Elsevier Patient Education  2020 [employer].   Pancreatitis Eating Plan Pancreatitis is when your pancreas becomes irritated and swollen (inflamed). The pancreas is a small organ located behind your stomach. It helps your body digest food and regulate your blood sugar. Pancreatitis can affect how your body digests food, especially foods with fat. You may also have other symptoms such as abdominal pain or nausea. When you have pancreatitis, following a low-fat eating plan may help you manage symptoms and recover more quickly. Work with your health care provider or a diet and nutrition specialist (dietitian) to create an eating plan that is right for you. What are tips for following this plan? Reading food labels Use the information on food labels to help keep track of how much fat you eat:  Check the serving size.  Look for the amount of total fat in grams (g) in one serving. ? Low-fat foods have 3 g of fat or less per serving. ? Fat-free foods have 0.5 g of fat or less per serving.  Keep track of how much fat you eat based on how many servings you eat. ? For example, if you eat two servings, the amount of fat you eat will be two times what is listed on the label. Shopping   Buy low-fat or nonfat foods, such as: ? Fresh, frozen, or canned fruits and vegetables. ? Grains, including pasta, bread, and rice. ? Lean meat, poultry, fish, and other protein foods. ? Low-fat or nonfat dairy.  Avoid buying bakery products and other sweets made with whole  milk, butter, and eggs.  Avoid buying snack foods with added fat, such as anything with butter or cheese flavoring. Cooking  Remove skin from poultry, and remove extra fat from meat.  Limit the amount of fat and oil you use to 6 teaspoons or less per day.  Cook using low-fat methods, such as boiling, broiling, grilling, steaming, or baking.  Use spray oil to cook. Add fat-free chicken broth to add flavor and moisture.  Avoid adding cream to thicken soups or sauces. Use other thickeners such as corn starch or tomato paste.  Meal planning   Eat a low-fat diet as told by your dietitian. For most people, this means having no more than 55-65 grams of fat each day.  Eat small, frequent meals throughout the day. For example, you may have 5-6 small meals instead of 3 large meals.  Drink enough fluid to keep your urine pale yellow.  Do not drink alcohol. Talk to your health care provider if you need help stopping.  Limit how much caffeine you have, including black coffee, black and green tea, caffeinated soft drinks, and energy drinks. General information  Let your health care provider or dietitian know if you have unplanned weight loss on this eating plan.  You may be instructed to follow a clear liquid diet during a flare of symptoms. Talk with your health care provider about how to manage your diet during symptoms of a flare.  Take any vitamins or supplements as told by your health care provider.  Work with a Data processing manager, especially if you have other conditions such as obesity or diabetes mellitus. What foods should I avoid? Fruits Fried fruits. Fruits served with butter or cream. Vegetables Fried vegetables. Vegetables cooked with butter, cheese, or cream. Grains Biscuits, waffles, donuts, pastries, and croissants. Pies and cookies. Butter-flavored popcorn. Regular crackers. Meats and other protein foods Fatty cuts of meat. Poultry with skin. Organ meats. Bacon, sausage, and cold  cuts. Whole eggs. Nuts and nut butters. Dairy Whole and 2% milk. Whole milk yogurt. Whole milk ice cream. Cream and half-and-half. Cream cheese. Sour cream. Cheese. Beverages Wine, beer, and liquor. The items listed above may not be a complete list of foods and beverages to avoid. Contact a dietitian for more information. Summary  Pancreatitis can affect how your body digests food, especially foods with fat.  When you have pancreatitis, it is recommended that you follow a low-fat eating plan to help you recover more quickly and manage symptoms. For most people, this means limiting fat to no more than 55-65 grams per day.  Do not drink alcohol. Limit the amount of caffeine you have, and drink enough fluid to keep your urine pale yellow. This information is not intended to replace advice given to you by your health care provider. Make sure you discuss any questions you have with your health care provider. Document Revised: 09/20/2018 Document Reviewed: 09/05/2017 Elsevier Patient Education  2020 [employer].

## 2019-10-22 NOTE — Discharge Summary (Signed)
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  Patient recently moved from [place] to [place].  He is homeless.  He has expressed suicidal ideation.  He was also recently evaluated in the ED 2 days ago for suicidal ideation and was clear for psych at that time. He presents complaining of abdominal pain. Evaluation in the ED: CT abdomen showed pancreatic and peripancreatic edema consistent with pancreatitis.  He was also noted to be in alcohol withdrawal.   Patient was admitted for pancreatitis, alcohol withdrawal on initially  report her suicidal thoughts.  He was treated with IV fluids, pain medication.  CIWA protocol.  He was evaluated by psych initially and was recommended inpatient admission.  Subsequently patient was reevaluated by psych and was clear for discharge.  Patient left AMA.  Discharge Diagnoses:  Principal Problem:   Alcoholic pancreatitis Active Problems:   Acute pancreatitis   Major depressive disorder, recurrent episode, severe (HCC)    Discharge Instructions     Allergies  Allergen Reactions  . Caffeine Palpitations    Consultations:  Psych    Procedures/Studies: CT ABDOMEN PELVIS W CONTRAST  Result Date: 10/20/2019 CLINICAL DATA:  Severe epigastric pain. EXAM: CT ABDOMEN AND PELVIS WITH CONTRAST TECHNIQUE: Multidetector CT imaging of the abdomen and pelvis was performed using the standard protocol following bolus administration of intravenous contrast. CONTRAST:  OMNIPAQUE IOHEXOL 300 MG/ML  SOLN COMPARISON:  None. FINDINGS: Lower chest: No acute abnormality.  Hepatobiliary: No focal liver abnormality is seen. No gallstones, gallbladder wall thickening, or biliary dilatation. Pancreas: Pancreatic and peripancreatic edema in the head and uncinate process of the pancreas. The body and tail of the pancreas have normal appearance. 1.5 cm cystic structure within the head of the pancreas peripherally may represent a pseudocyst or cystic pancreatic neoplasm. Spleen: Normal in size without focal abnormality. Adrenals/Urinary Tract: Adrenal glands are unremarkable. Kidneys are normal, without renal calculi, focal lesion, or hydronephrosis. Bladder is unremarkable. Stomach/Bowel: Normal appearance of the stomach. Inflammatory changes of the second and third portions of the duodenum, likely reactive. No evidence of small-bowel obstruction. Normal appearance of the colon and appendix. Vascular/Lymphatic: No significant vascular findings are present. No enlarged abdominal or pelvic lymph nodes. Shotty peripancreatic lymph nodes, not pathologic by CT criteria. Reproductive: Prostate is unremarkable. Other: No abdominal wall hernia or abnormality. No abdominopelvic ascites. Musculoskeletal: No acute or significant osseous findings. IMPRESSION: 1. Pancreatic and peripancreatic edema of the head and uncinate process of the pancreas. The findings are most consistent with acute pancreatitis. 2. 1.5 cm cystic structure within the head of the pancreas peripherally may represent a small pseudocyst or cystic pancreatic neoplasm. Re-evaluation upon resolution of the acute symptoms is recommended. 3. Inflammatory changes of the second and third portions of the duodenum, likely reactive. Electronically Signed   By: Ted Mcalpine M.D.   On: 10/20/2019 12:49     Subjective: No suicidal.   Discharge Exam: Vitals:   10/22/19 0727 10/22/19 1035  BP: 123/85 129/85  Pulse: 79 73  Resp:    Temp: (!) 97.5 F (36.4 C)   SpO2: 94%      General: Pt is alert, awake, not in acute  distress Cardiovascular: RRR, S1/S2 +, no rubs, no gallops Respiratory: CTA bilaterally, no wheezing, no rhonchi Abdominal: Soft, NT, ND, bowel sounds + Extremities: no edema, no cyanosis    The results of significant diagnostics from this hospitalization (including imaging, microbiology, ancillary and laboratory) are listed below for reference.     Microbiology: Recent Results (from the past 240 hour(s))  Respiratory Panel by RT PCR (Flu A&B, Covid) - Nasopharyngeal Swab     Status: None   Collection Time: 10/18/19  4:39 PM   Specimen: Nasopharyngeal Swab  Result Value Ref Range Status   SARS Coronavirus 2 by RT PCR NEGATIVE NEGATIVE Final    Comment: (NOTE) SARS-CoV-2 target nucleic acids are NOT DETECTED. The SARS-CoV-2 RNA is generally detectable in upper respiratoy specimens during the acute phase of infection. The lowest concentration of SARS-CoV-2 viral copies this assay can detect is 131 copies/mL. A negative result does not preclude SARS-Cov-2 infection and should not be used as the sole basis for treatment or other patient management decisions. A negative result may occur with  improper specimen collection/handling, submission of specimen other than nasopharyngeal swab, presence of viral mutation(s) within the areas targeted by this assay, and inadequate number of viral copies (<131 copies/mL). A negative result must be combined with clinical observations, patient history, and epidemiological information. The expected result is Negative. Fact Sheet for Patients:  https://www.moore.com/ Fact Sheet for Healthcare Providers:  https://www.young.biz/ This test is not yet ap proved or cleared by the [place] FDA and  has been authorized for detection and/or diagnosis of SARS-CoV-2 by FDA under an Emergency Use Authorization (EUA). This EUA will remain  in effect (meaning this test can be used) for the duration of the COVID-19  declaration under Section 564(b)(1) of the Act, 21 U.S.C. section 360bbb-3(b)(1), unless the authorization is terminated or revoked sooner.    Influenza A by PCR NEGATIVE NEGATIVE Final   Influenza B by PCR NEGATIVE NEGATIVE Final    Comment: (NOTE) The Xpert Xpress SARS-CoV-2/FLU/RSV assay is intended as an aid in  the diagnosis of influenza from Nasopharyngeal swab specimens and  should not be used as a sole basis for treatment. Nasal washings and  aspirates are unacceptable for Xpert Xpress SARS-CoV-2/FLU/RSV  testing. Fact Sheet for Patients: https://www.moore.com/ Fact Sheet for Healthcare Providers: https://www.young.biz/ This test is not yet approved or cleared by the [place] FDA and  has been authorized for detection and/or diagnosis of SARS-CoV-2 by  FDA under an Emergency Use Authorization (EUA). This EUA will remain  in effect (meaning this test can be used) for the duration of the  Covid-19 declaration under Section 564(b)(1) of the Act, 21  U.S.C. section 360bbb-3(b)(1), unless the authorization is  terminated or revoked. Performed at [facility], [address], [place], [place] [address]   Respiratory Panel by RT PCR (Flu A&B, Covid) - Nasopharyngeal Swab     Status: None   Collection Time: 10/20/19  2:01 PM   Specimen: Nasopharyngeal Swab  Result Value Ref Range Status   SARS Coronavirus 2 by RT PCR NEGATIVE NEGATIVE Final    Comment: (NOTE) SARS-CoV-2 target nucleic acids are NOT DETECTED. The SARS-CoV-2 RNA is generally detectable in upper respiratoy specimens during the acute phase of infection. The lowest concentration of SARS-CoV-2 viral copies this assay can detect is 131 copies/mL. A negative result does not preclude SARS-Cov-2 infection and should not be used as the sole basis for treatment or other patient management decisions. A negative result  may occur with  improper specimen  collection/handling, submission of specimen other than nasopharyngeal swab, presence of viral mutation(s) within the areas targeted by this assay, and inadequate number of viral copies (<131 copies/mL). A negative result must be combined with clinical observations, patient history, and epidemiological information. The expected result is Negative. Fact Sheet for Patients:  PinkCheek.be Fact Sheet for Healthcare Providers:  GravelBags.it This test is not yet ap proved or cleared by the [place] FDA and  has been authorized for detection and/or diagnosis of SARS-CoV-2 by FDA under an Emergency Use Authorization (EUA). This EUA will remain  in effect (meaning this test can be used) for the duration of the COVID-19 declaration under Section 564(b)(1) of the Act, 21 U.S.C. section 360bbb-3(b)(1), unless the authorization is terminated or revoked sooner.    Influenza A by PCR NEGATIVE NEGATIVE Final   Influenza B by PCR NEGATIVE NEGATIVE Final    Comment: (NOTE) The Xpert Xpress SARS-CoV-2/FLU/RSV assay is intended as an aid in  the diagnosis of influenza from Nasopharyngeal swab specimens and  should not be used as a sole basis for treatment. Nasal washings and  aspirates are unacceptable for Xpert Xpress SARS-CoV-2/FLU/RSV  testing. Fact Sheet for Patients: PinkCheek.be Fact Sheet for Healthcare Providers: GravelBags.it This test is not yet approved or cleared by the [place] FDA and  has been authorized for detection and/or diagnosis of SARS-CoV-2 by  FDA under an Emergency Use Authorization (EUA). This EUA will remain  in effect (meaning this test can be used) for the duration of the  Covid-19 declaration under Section 564(b)(1) of the Act, 21  U.S.C. section 360bbb-3(b)(1), unless the authorization is  terminated or revoked. Performed at [facility], [place] [address] [place], [place] [address]      Labs: BNP (last 3 results) No results for input(s): BNP in the last 8760 hours. Basic Metabolic Panel: Recent Labs  Lab 10/15/19 1754 10/18/19 1521 10/20/19 1110 10/20/19 1517 10/21/19 0254  NA 140 137 133*  --  134*  K 3.4* 4.1 4.5  --  3.6  CL 102 101 99  --  103  CO2 28 25 23   --  23  GLUCOSE 118* 112* 159*  --  89  BUN 9 14 18   --  10  CREATININE 0.76 0.80 0.94  --  0.80  CALCIUM 10.1 10.0 8.8*  --  8.1*  MG  --   --   --  1.9  --   PHOS  --   --   --  2.7  --    Liver Function Tests: Recent Labs  Lab 10/15/19 1754 10/18/19 1521 10/20/19 1110 10/21/19 0254  AST 52* 61* 109* 97*  ALT 42 47* 60* 56*  ALKPHOS 63 68 74 60  BILITOT 0.9 1.0 0.8 1.3*  PROT 6.8 6.9 6.9 5.5*  ALBUMIN 4.1 4.3 4.2 3.4*   Recent Labs  Lab 10/15/19 1754 10/18/19 1521 10/20/19 1110  LIPASE 20 17 71*   No results for input(s): AMMONIA in the last 168 hours. CBC: Recent Labs  Lab 10/15/19 1754 10/18/19 1521 10/20/19 1110 10/21/19 0254  WBC 6.1 8.1 8.0 10.4  NEUTROABS  --  4.2  --   --   HGB 14.5 15.6 16.2 13.6  HCT 41.7 44.9 46.4 39.7  MCV 97.9 97.6 97.9 97.5  PLT 240 259 267 183   Cardiac Enzymes: No results for input(s): CKTOTAL, CKMB, CKMBINDEX, TROPONINI in the last 168 hours. BNP: Invalid input(s):  POCBNP CBG: No results for input(s): GLUCAP in the last 168 hours. D-Dimer No results for input(s): DDIMER in the last 72 hours. Hgb A1c No results for input(s): HGBA1C in the last 72 hours. Lipid Profile No results for input(s): CHOL, HDL, LDLCALC, TRIG, CHOLHDL, LDLDIRECT in the last 72 hours. Thyroid function studies No results for input(s): TSH, T4TOTAL, T3FREE, THYROIDAB in the last 72 hours.  Invalid input(s): FREET3 Anemia work up No results for input(s): VITAMINB12, FOLATE, FERRITIN, TIBC, IRON, RETICCTPCT in the last 72 hours. Urinalysis    Component Value Date/Time   COLORURINE COLORLESS (A) 10/15/2019  1845   APPEARANCEUR CLEAR 10/15/2019 1845   LABSPEC 1.002 (L) 10/15/2019 1845   PHURINE 7.0 10/15/2019 1845   GLUCOSEU NEGATIVE 10/15/2019 1845   HGBUR NEGATIVE 10/15/2019 1845   BILIRUBINUR NEGATIVE 10/15/2019 1845   KETONESUR NEGATIVE 10/15/2019 1845   PROTEINUR NEGATIVE 10/15/2019 1845   NITRITE NEGATIVE 10/15/2019 1845   LEUKOCYTESUR NEGATIVE 10/15/2019 1845   Sepsis Labs Invalid input(s): PROCALCITONIN,  WBC,  LACTICIDVEN Microbiology Recent Results (from the past 240 hour(s))  Respiratory Panel by RT PCR (Flu A&B, Covid) - Nasopharyngeal Swab     Status: None   Collection Time: 10/18/19  4:39 PM   Specimen: Nasopharyngeal Swab  Result Value Ref Range Status   SARS Coronavirus 2 by RT PCR NEGATIVE NEGATIVE Final    Comment: (NOTE) SARS-CoV-2 target nucleic acids are NOT DETECTED. The SARS-CoV-2 RNA is generally detectable in upper respiratoy specimens during the acute phase of infection. The lowest concentration of SARS-CoV-2 viral copies this assay can detect is 131 copies/mL. A negative result does not preclude SARS-Cov-2 infection and should not be used as the sole basis for treatment or other patient management decisions. A negative result may occur with  improper specimen collection/handling, submission of specimen other than nasopharyngeal swab, presence of viral mutation(s) within the areas targeted by this assay, and inadequate number of viral copies (<131 copies/mL). A negative result must be combined with clinical observations, patient history, and epidemiological information. The expected result is Negative. Fact Sheet for Patients:  https://www.moore.com/https://www.fda.gov/media/142436/download Fact Sheet for Healthcare Providers:  https://www.young.biz/https://www.fda.gov/media/142435/download This test is not yet ap proved or cleared by the [place] FDA and  has been authorized for detection and/or diagnosis of SARS-CoV-2 by FDA under an Emergency Use Authorization (EUA). This EUA will remain   in effect (meaning this test can be used) for the duration of the COVID-19 declaration under Section 564(b)(1) of the Act, 21 U.S.C. section 360bbb-3(b)(1), unless the authorization is terminated or revoked sooner.    Influenza A by PCR NEGATIVE NEGATIVE Final   Influenza B by PCR NEGATIVE NEGATIVE Final    Comment: (NOTE) The Xpert Xpress SARS-CoV-2/FLU/RSV assay is intended as an aid in  the diagnosis of influenza from Nasopharyngeal swab specimens and  should not be used as a sole basis for treatment. Nasal washings and  aspirates are unacceptable for Xpert Xpress SARS-CoV-2/FLU/RSV  testing. Fact Sheet for Patients: https://www.moore.com/https://www.fda.gov/media/142436/download Fact Sheet for Healthcare Providers: https://www.young.biz/https://www.fda.gov/media/142435/download This test is not yet approved or cleared by the [place] FDA and  has been authorized for detection and/or diagnosis of SARS-CoV-2 by  FDA under an Emergency Use Authorization (EUA). This EUA will remain  in effect (meaning this test can be used) for the duration of the  Covid-19 declaration under Section 564(b)(1) of the Act, 21  U.S.C. section 360bbb-3(b)(1), unless the authorization is  terminated or revoked. Performed at [facility], [address] [place], [place] [address]   Respiratory Panel by RT PCR (Flu A&B, Covid) - Nasopharyngeal Swab     Status: None   Collection Time: 10/20/19  2:01 PM   Specimen: Nasopharyngeal Swab  Result Value Ref Range Status   SARS Coronavirus 2 by RT PCR NEGATIVE NEGATIVE Final    Comment: (NOTE) SARS-CoV-2 target nucleic acids are NOT DETECTED. The SARS-CoV-2 RNA is generally detectable in upper respiratoy specimens during the acute phase of infection. The lowest concentration of SARS-CoV-2 viral copies this assay can detect is 131 copies/mL. A negative result does not preclude SARS-Cov-2 infection and should not be used as the sole basis for treatment or other patient  management decisions. A negative result may occur with  improper specimen collection/handling, submission of specimen other than nasopharyngeal swab, presence of viral mutation(s) within the areas targeted by this assay, and inadequate number of viral copies (<131 copies/mL). A negative result must be combined with clinical observations, patient history, and epidemiological information. The expected result is Negative. Fact Sheet for Patients:  https://www.moore.com/ Fact Sheet for Healthcare Providers:  https://www.young.biz/ This test is not yet ap proved or cleared by the [place] FDA and  has been authorized for detection and/or diagnosis of SARS-CoV-2 by FDA under an Emergency Use Authorization (EUA). This EUA will remain  in effect (meaning this test can be used) for the duration of the COVID-19 declaration under Section 564(b)(1) of the Act, 21 U.S.C. section 360bbb-3(b)(1), unless the authorization is terminated or revoked sooner.    Influenza A by PCR NEGATIVE NEGATIVE Final   Influenza B by PCR NEGATIVE NEGATIVE Final    Comment: (NOTE) The Xpert Xpress SARS-CoV-2/FLU/RSV assay is intended as an aid in  the diagnosis of influenza from Nasopharyngeal swab specimens and  should not be used as a sole basis for treatment. Nasal washings and  aspirates are unacceptable for Xpert Xpress SARS-CoV-2/FLU/RSV  testing. Fact Sheet for Patients: https://www.moore.com/ Fact Sheet for Healthcare Providers: https://www.young.biz/ This test is not yet approved or cleared by the [place] FDA and  has been authorized for detection and/or diagnosis of SARS-CoV-2 by  FDA under an Emergency Use Authorization (EUA). This EUA will remain  in effect (meaning this test can be used) for the duration of the  Covid-19 declaration under Section 564(b)(1) of the Act, 21  U.S.C. section 360bbb-3(b)(1), unless the  authorization is  terminated or revoked. Performed at [facility], [address], [place], [place] [address]      Time coordinating discharge: 40 minutes  SIGNED:   Alba Cory, MD  [facility]

## 2019-10-22 NOTE — Progress Notes (Signed)
Patient is sitting at his bedside table doing his "paperwork" and refuses any medications.  Patient states he does not want any sedative medications until he is finished working on his paperwork.

## 2019-10-22 NOTE — Consult Note (Signed)
Telepsych Consultation   Reason for Consult:  "re evaluation " Referring Physician:  Dr. Sunnie Nielsen Location of Patient:  [facility] Location of Provider: [facility]  Patient Identification: Nicolas [name] MRN:  [id number] Principal Diagnosis: Alcoholic pancreatitis Diagnosis:  Principal Problem:   Alcoholic pancreatitis Active Problems:   Acute pancreatitis   Major depressive disorder, recurrent episode, severe (HCC)   Total Time spent with patient: 30 minutes  Subjective:   Nicolas [name] is a 54 y.o. adult patient.  Patient states "COVID knocked me on my ass, it is hard but I am getting back on my feet." Patient assessed by nurse practitioner.  Patient observed seated, sitter at bedside.  Patient alert and oriented, answers appropriately. Patient prefers to be called "Nicolas [name]." Patient states "I was going to AA and everything was great but I was living with a woman who was controlling so I got an apartment but I started getting sick because of my drinking, I called 911 and ended up here with pancreatitis." Patient denies suicidal ideations.  Patient denies history of suicide attempts, patient denies history of self-harm.  Patient denies homicidal ideations.  Patient denies auditory visual hallucinations.  Patient denies symptoms of paranoia Patient reports "I have been going to AA every day since December 13, I go to [facility] Monday through Friday and [employer] [facility] on Saturday and Sunday." Patient reports he lives alone in [place].  Patient denies access to weapons.  Patient employed as a Environmental manager.  Patient endorses history of alcohol use, denies substance use.  Patient reports [facility].  Patient denies current medications.  Patient reports previously prescribed antidepressant medication, stopped because "it did not do anything." Patient reports at this time he would prefer to be discharged and follow-up with outpatient resources "on my  own." Patient gives verbal consent to speak to supportive friend, Nicolas [name]. Phone number [phone]. Spoke with patient's friend, Nicolas [name]: Patient's friend denies patient has history of self-harm, denies any statements of suicide or self-harm behaviors. Nicolas [name] denies knowledge of any weapons owned by Nicolas [name].  Nicolas [name] states "he has been struggling with alcoholism and I think he needs to go to detox for as long as possible."  HPI:  Patient presents with abdominal pain, recent recurrent pancreatitis.   Past Psychiatric History: Major depressive disorder, alcohol use disorder  Risk to Self:  Denies Risk to Others:  Denies Prior Inpatient Therapy:  : [facility] 09/2019 Prior Outpatient Therapy:  Currently  [facility]  Past Medical History:  Past Medical History:  Diagnosis Date  . Alcohol abuse     Past Surgical History:  Procedure Laterality Date  . NASAL SEPTUM SURGERY     Family History: No family history on file. Family Psychiatric  History: Denies Social History:  Social History   Substance and Sexual Activity  Alcohol Use Yes  . Alcohol/week: 6.0 standard drinks  . Types: 6 Cans of beer per week   Comment: daily use     Social History   Substance and Sexual Activity  Drug Use Not Currently  . Types: Cocaine   Comment: stopped 01/2019    Social History   Socioeconomic History  . Marital status: Single    Spouse name: Not on file  . Number of children: Not on file  . Years of education: Not on file  . Highest education level: Not on file  Occupational History  . Not on file  Tobacco Use  . Smoking status: Current  Every Day Smoker    Packs/day: 0.50    Types: Cigarettes  . Smokeless tobacco: Never Used  Substance and Sexual Activity  . Alcohol use: Yes    Alcohol/week: 6.0 standard drinks    Types: 6 Cans of beer per week    Comment: daily use  . Drug use: Not Currently    Types: Cocaine    Comment: stopped 01/2019  . Sexual activity: Not on file   Other Topics Concern  . Not on file  Social History Narrative  . Not on file   Social Determinants of Health   Financial Resource Strain:   . Difficulty of Paying Living Expenses:   Food Insecurity:   . Worried About Programme researcher, broadcasting/film/video in the Last Year:   . Barista in the Last Year:   Transportation Needs:   . Freight forwarder (Medical):   Marland Kitchen Lack of Transportation (Non-Medical):   Physical Activity:   . Days of Exercise per Week:   . Minutes of Exercise per Session:   Stress:   . Feeling of Stress :   Social Connections:   . Frequency of Communication with Friends and Family:   . Frequency of Social Gatherings with Friends and Family:   . Attends Religious Services:   . Active Member of Clubs or Organizations:   . Attends Banker Meetings:   Marland Kitchen Marital Status:    Additional Social History:    Allergies:   Allergies  Allergen Reactions  . Caffeine Palpitations    Labs:  Results for orders placed or performed during the hospital encounter of 10/20/19 (from the past 48 hour(s))  Comprehensive metabolic panel     Status: Abnormal   Collection Time: 10/20/19 11:10 AM  Result Value Ref Range   Sodium 133 (L) 135 - 145 mmol/L   Potassium 4.5 3.5 - 5.1 mmol/L   Chloride 99 98 - 111 mmol/L   CO2 23 22 - 32 mmol/L   Glucose, Bld 159 (H) 70 - 99 mg/dL    Comment: Glucose reference range applies only to samples taken after fasting for at least 8 hours.   BUN 18 6 - 20 mg/dL   Creatinine, Ser 4.25 0.61 - 1.24 mg/dL   Calcium 8.8 (L) 8.9 - 10.3 mg/dL   Total Protein 6.9 6.5 - 8.1 g/dL   Albumin 4.2 3.5 - 5.0 g/dL   AST 956 (H) 15 - 41 U/L   ALT 60 (H) 0 - 44 U/L   Alkaline Phosphatase 74 38 - 126 U/L   Total Bilirubin 0.8 0.3 - 1.2 mg/dL   GFR calc non Af Amer >60 >60 mL/min   GFR calc Af Amer >60 >60 mL/min   Anion gap 11 5 - 15    Comment: Performed at [facility], [address] [place], [place] [address]  Ethanol     Status:  Abnormal   Collection Time: 10/20/19 11:10 AM  Result Value Ref Range   Alcohol, Ethyl (B) 100 (H) <10 mg/dL    Comment: (NOTE) Lowest detectable limit for serum alcohol is 10 mg/dL. For medical purposes only. Performed at [facility], [address] [place], [place] [address]   Salicylate level     Status: Abnormal   Collection Time: 10/20/19 11:10 AM  Result Value Ref Range   Salicylate Lvl <7.0 (L) 7.0 - 30.0 mg/dL    Comment: Performed at [facility], [address] [place],   [address]  Acetaminophen level     Status: Abnormal   Collection Time: 10/20/19 11:10 AM  Result Value Ref Range   Acetaminophen (Tylenol), Serum <10 (L) 10 - 30 ug/mL    Comment: (NOTE) Therapeutic concentrations vary significantly. A range of 10-30 ug/mL  may be an effective concentration for many patients. However, some  are best treated at concentrations outside of this range. Acetaminophen concentrations >150 ug/mL at 4 hours after ingestion  and >50 ug/mL at 12 hours after ingestion are often associated with  toxic reactions. Performed at [facility], [address], [place], [place] [address]   cbc     Status: Abnormal   Collection Time: 10/20/19 11:10 AM  Result Value Ref Range   WBC 8.0 4.0 - 10.5 K/uL   RBC 4.74 4.22 - 5.81 MIL/uL   Hemoglobin 16.2 13.0 - 17.0 g/dL   HCT 09.8 11.9 - 14.7 %   MCV 97.9 80.0 - 100.0 fL   MCH 34.2 (H) 26.0 - 34.0 pg   MCHC 34.9 30.0 - 36.0 g/dL   RDW 82.9 56.2 - 13.0 %   Platelets 267 150 - 400 K/uL   nRBC 0.0 0.0 - 0.2 %    Comment: Performed at [facility], [address], [place], [place] [address]  Lipase, blood     Status: Abnormal   Collection Time: 10/20/19 11:10 AM  Result Value Ref Range   Lipase 71 (H) 11 - 51 U/L    Comment: Performed at [facility], [address], [place], [place] [address]  Rapid urine drug screen (hospital performed)     Status: None   Collection Time: 10/20/19  1:33 PM  Result  Value Ref Range   Opiates NONE DETECTED NONE DETECTED   Cocaine NONE DETECTED NONE DETECTED   Benzodiazepines NONE DETECTED NONE DETECTED   Amphetamines NONE DETECTED NONE DETECTED   Tetrahydrocannabinol NONE DETECTED NONE DETECTED   Barbiturates NONE DETECTED NONE DETECTED    Comment: (NOTE) DRUG SCREEN FOR MEDICAL PURPOSES ONLY.  IF CONFIRMATION IS NEEDED FOR ANY PURPOSE, NOTIFY LAB WITHIN 5 DAYS. LOWEST DETECTABLE LIMITS FOR URINE DRUG SCREEN Drug Class                     Cutoff (ng/mL) Amphetamine and metabolites    1000 Barbiturate and metabolites    200 Benzodiazepine                 200 Tricyclics and metabolites     300 Opiates and metabolites        300 Cocaine and metabolites        300 THC                            50 Performed at [facility], [address], [place], [place] [address]   Respiratory Panel by RT PCR (Flu A&B, Covid) - Nasopharyngeal Swab     Status: None   Collection Time: 10/20/19  2:01 PM   Specimen: Nasopharyngeal Swab  Result Value Ref Range   SARS Coronavirus 2 by RT PCR NEGATIVE NEGATIVE    Comment: (NOTE) SARS-CoV-2 target nucleic acids are NOT DETECTED. The SARS-CoV-2 RNA is generally detectable in upper respiratoy specimens during the acute phase of infection. The lowest concentration of SARS-CoV-2 viral copies this assay can detect is 131 copies/mL. A negative result does not preclude SARS-Cov-2 infection and should  not be used as the sole basis for treatment or other patient management decisions. A negative result may occur with  improper specimen collection/handling, submission of specimen other than nasopharyngeal swab, presence of viral mutation(s) within the areas targeted by this assay, and inadequate number of viral copies (<131 copies/mL). A negative result must be combined with clinical observations, patient history, and epidemiological information. The expected result is Negative. Fact Sheet for Patients:   https://www.moore.com/ Fact Sheet for Healthcare Providers:  https://www.young.biz/ This test is not yet ap proved or cleared by the [place] FDA and  has been authorized for detection and/or diagnosis of SARS-CoV-2 by FDA under an Emergency Use Authorization (EUA). This EUA will remain  in effect (meaning this test can be used) for the duration of the COVID-19 declaration under Section 564(b)(1) of the Act, 21 U.S.C. section 360bbb-3(b)(1), unless the authorization is terminated or revoked sooner.    Influenza A by PCR NEGATIVE NEGATIVE   Influenza B by PCR NEGATIVE NEGATIVE    Comment: (NOTE) The Xpert Xpress SARS-CoV-2/FLU/RSV assay is intended as an aid in  the diagnosis of influenza from Nasopharyngeal swab specimens and  should not be used as a sole basis for treatment. Nasal washings and  aspirates are unacceptable for Xpert Xpress SARS-CoV-2/FLU/RSV  testing. Fact Sheet for Patients: https://www.moore.com/ Fact Sheet for Healthcare Providers: https://www.young.biz/ This test is not yet approved or cleared by the [place] FDA and  has been authorized for detection and/or diagnosis of SARS-CoV-2 by  FDA under an Emergency Use Authorization (EUA). This EUA will remain  in effect (meaning this test can be used) for the duration of the  Covid-19 declaration under Section 564(b)(1) of the Act, 21  U.S.C. section 360bbb-3(b)(1), unless the authorization is  terminated or revoked. Performed at [facility], [address] [place], [place] [address]   Magnesium     Status: None   Collection Time: 10/20/19  3:17 PM  Result Value Ref Range   Magnesium 1.9 1.7 - 2.4 mg/dL    Comment: Performed at [facility], [address] [place], [place] [address]  Phosphorus     Status: None   Collection Time: 10/20/19  3:17 PM  Result Value Ref Range   Phosphorus 2.7 2.5 - 4.6 mg/dL     Comment: Performed at [facility], [address], [place], [place] [address]  HIV Antibody (routine testing w rflx)     Status: None   Collection Time: 10/20/19  3:17 PM  Result Value Ref Range   HIV Screen 4th Generation wRfx Non Reactive Non Reactive    Comment: (NOTE) Performed At: [facility] [address] [place], [place] [id number] Jolene Schimke MD LK:[id number]   Comprehensive metabolic panel     Status: Abnormal   Collection Time: 10/21/19  2:54 AM  Result Value Ref Range   Sodium 134 (L) 135 - 145 mmol/L   Potassium 3.6 3.5 - 5.1 mmol/L   Chloride 103 98 - 111 mmol/L   CO2 23 22 - 32 mmol/L   Glucose, Bld 89 70 - 99 mg/dL    Comment: Glucose reference range applies only to samples taken after fasting for at least 8 hours.   BUN 10 6 - 20 mg/dL   Creatinine, Ser 6.64 0.61 - 1.24 mg/dL   Calcium 8.1 (L) 8.9 - 10.3 mg/dL   Total Protein 5.5 (L) 6.5 - 8.1 g/dL   Albumin 3.4 (L) 3.5 - 5.0 g/dL  AST 97 (H) 15 - 41 U/L   ALT 56 (H) 0 - 44 U/L   Alkaline Phosphatase 60 38 - 126 U/L   Total Bilirubin 1.3 (H) 0.3 - 1.2 mg/dL   GFR calc non Af Amer >60 >60 mL/min   GFR calc Af Amer >60 >60 mL/min   Anion gap 8 5 - 15    Comment: Performed at [facility], [address], [place], [place] [address]  CBC     Status: Abnormal   Collection Time: 10/21/19  2:54 AM  Result Value Ref Range   WBC 10.4 4.0 - 10.5 K/uL   RBC 4.07 (L) 4.22 - 5.81 MIL/uL   Hemoglobin 13.6 13.0 - 17.0 g/dL   HCT 41.3 24.4 - 01.0 %   MCV 97.5 80.0 - 100.0 fL   MCH 33.4 26.0 - 34.0 pg   MCHC 34.3 30.0 - 36.0 g/dL   RDW 27.2 53.6 - 64.4 %   Platelets 183 150 - 400 K/uL   nRBC 0.0 0.0 - 0.2 %    Comment: Performed at [facility], [address] [place], [place] [address]    Medications:  Current Facility-Administered Medications  Medication Dose Route Frequency Provider Last Rate Last Admin  . 0.9 %  sodium chloride infusion   Intravenous Continuous Regalado, Belkys  A, MD 125 mL/hr at 10/22/19 1046 New Bag at 10/22/19 1046  . acetaminophen (TYLENOL) tablet 650 mg  650 mg Oral Q6H PRN Mikey College T, MD       Or  . acetaminophen (TYLENOL) suppository 650 mg  650 mg Rectal Q6H PRN Mikey College T, MD      . acidophilus (RISAQUAD) capsule 1 capsule  1 capsule Oral Daily Emeline General, MD   1 capsule at 10/22/19 0910  . bismuth subsalicylate (PEPTO BISMOL) 262 MG/15ML suspension 30 mL  30 mL Oral Q6H PRN Mikey College T, MD      . enoxaparin (LOVENOX) injection 40 mg  40 mg Subcutaneous Q24H Regalado, Belkys A, MD   40 mg at 10/21/19 1506  . estradiol (CLIMARA - Dosed in mg/24 hr) patch 0.1 mg  0.1 mg Transdermal Weekly Mikey College T, MD   0.1 mg at 10/21/19 1136  . folic acid (FOLVITE) tablet 1 mg  1 mg Oral Daily Mikey College T, MD   1 mg at 10/22/19 0910  . hydrOXYzine (ATARAX/VISTARIL) tablet 25 mg  25 mg Oral QHS PRN Emeline General, MD   25 mg at 10/21/19 0158  . ketorolac (TORADOL) 30 MG/ML injection 30 mg  30 mg Intravenous Q6H PRN Mikey College T, MD   30 mg at 10/20/19 1734  . LORazepam (ATIVAN) injection 0-4 mg  0-4 mg Intravenous Q6H Sabas Sous, MD   1 mg at 10/22/19 0900   Or  . LORazepam (ATIVAN) tablet 0-4 mg  0-4 mg Oral Q6H Sabas Sous, MD   1 mg at 10/22/19 0103  . LORazepam (ATIVAN) injection 0-4 mg  0-4 mg Intravenous Q12H Sabas Sous, MD       Or  . LORazepam (ATIVAN) tablet 0-4 mg  0-4 mg Oral Q12H Sabas Sous, MD      . LORazepam (ATIVAN) tablet 1-4 mg  1-4 mg Oral Q1H PRN Emeline General, MD   1 mg at 10/22/19 0309   Or  . LORazepam (ATIVAN) injection 1-4 mg  1-4 mg Intravenous Q1H PRN Emeline General, MD   1  mg at 10/22/19 1040  . melatonin tablet 3 mg  3 mg Oral QHS Mikey CollegeZhang, Ping T, MD   3 mg at 10/21/19 2116  . multivitamin with minerals tablet 1 tablet  1 tablet Oral Daily Emeline GeneralZhang, Ping T, MD   1 tablet at 10/22/19 0910  . nicotine (NICODERM CQ - dosed in mg/24 hours) patch 21 mg  21 mg Transdermal Daily Mikey CollegeZhang, Ping T, MD   21 mg at  10/22/19 0910  . ondansetron (ZOFRAN) injection 4 mg  4 mg Intravenous Q8H PRN Marikay Alarenny, Melanie, FNP   4 mg at 10/22/19 0847  . oxyCODONE (Oxy IR/ROXICODONE) immediate release tablet 5 mg  5 mg Oral Q4H PRN Mikey CollegeZhang, Ping T, MD   5 mg at 10/21/19 2359  . pantoprazole (PROTONIX) injection 40 mg  40 mg Intravenous Q12H Regalado, Belkys A, MD   40 mg at 10/22/19 0902  . [START ON 10/23/2019] polyethylene glycol (MIRALAX / GLYCOLAX) packet 17 g  17 g Oral Daily Regalado, Belkys A, MD      . senna (SENOKOT) tablet 8.6 mg  1 tablet Oral BID Regalado, Belkys A, MD   8.6 mg at 10/22/19 1040  . thiamine tablet 100 mg  100 mg Oral Daily Mikey CollegeZhang, Ping T, MD   100 mg at 10/22/19 [id number]   Or  . thiamine (B-1) injection 100 mg  100 mg Intravenous Daily Emeline GeneralZhang, Ping T, MD        Musculoskeletal: Strength & Muscle Tone: within normal limits Gait & Station: normal Patient leans: N/A  Psychiatric Specialty Exam: Physical Exam  Nursing note and vitals reviewed. Constitutional: Nicolas DredgeJeffery [name] is oriented to person, place, and time. Nicolas DredgeJeffery [name] appears well-developed.  HENT:  Head: Normocephalic.  Cardiovascular: Normal rate.  Respiratory: Effort normal.  Musculoskeletal:        General: Normal range of motion.  Neurological: Nicolas DredgeJeffery [name] is alert and oriented to person, place, and time.  Psychiatric: Nicolas DredgeJeffery [name] has a normal mood and affect. Nicolas [name]'s speech is normal and behavior is normal. Judgment and thought content normal. Cognition and memory are normal.    Review of Systems  Constitutional: Negative.   HENT: Negative.   Eyes: Negative.   Respiratory: Negative.   Cardiovascular: Negative.   Gastrointestinal: Negative.   Genitourinary: Negative.   Musculoskeletal: Negative.   Skin: Negative.   Neurological: Negative.   Psychiatric/Behavioral: Negative.     Blood pressure 129/85, pulse 73, temperature (!) 97.5 F (36.4 C), temperature source Oral, resp. rate 18, height 5\' 9"  (1.753 m),  weight 80.7 kg, SpO2 94 %.Body mass index is 26.29 kg/m.  General Appearance: Casual and Fairly Groomed  Eye Contact:  Good  Speech:  Clear and Coherent and Normal Rate  Volume:  Normal  Mood:  Anxious  Affect:  Appropriate and Congruent  Thought Process:  Coherent, Goal Directed and Descriptions of Associations: Intact  Orientation:  Full (Time, Place, and Person)  Thought Content:  WDL and Logical  Suicidal Thoughts:  No  Homicidal Thoughts:  No  Memory:  Immediate;   Good Recent;   Good Remote;   Good  Judgement:  Good  Insight:  Fair  Psychomotor Activity:  Normal  Concentration:  Concentration: Good and Attention Span: Good  Recall:  Good  Fund of Knowledge:  Good  Language:  Good  Akathisia:  No  Handed:  Right  AIMS (if indicated):     Assets:  Communication Skills Desire for Improvement Financial Resources/Insurance Housing Intimacy Leisure Time Physical  Health Resilience Social Support Talents/Skills  ADL's:  Intact  Cognition:  WNL  Sleep:        Treatment Plan Summary: Patient discussed with Dr. Dwyane Dee. Plan follow up with outpatient psychiatry and substance use treatment resources  Disposition: No evidence of imminent risk to self or others at present.   Patient does not meet criteria for psychiatric inpatient admission. Supportive therapy provided about ongoing stressors. Discussed crisis plan, support from social network, calling 911, coming to the Emergency Department, and calling Suicide Hotline.  This service was provided via telemedicine using a 2-way, interactive audio and video technology.  Names of all persons participating in this telemedicine service and their role in this encounter. Name: Nicolas [name] "Nicolas [name]" Role: Patient  Name: Robin-via telephone Role: Patient's friend  Name: Letitia Libra  Role: [place], [place] 10/22/2019 10:57 AM

## 2019-10-22 NOTE — TOC Progression Note (Addendum)
Transition of Care [facility]) - Progression Note    Patient Details  Name: Nicolas [name] MRN: [id number] Date of Birth: 02/04/1966  Transition of Care [facility]) CM/SW [place], [place] Phone Number: 10/22/2019, 9:32 AM  Clinical Narrative:     Met with pt. And provided pt with his previous AVS which pt stated he lost. Pt. And CSW discussed psychs recommendation for inpatient. Pt states he "agrees to a degree" and shares that he has a court date, move in date, and other responsibilities he has. Pt also questions about his car; CSW recommends calling a tow truck company but that pt would have to pay; pt is undecided on tow truck. Pt is frustrated and explains that he would like to leave; he has concerns about hospital cost. CSW explained he is not medically clear yet. Pt explains he wants to leave once medically clear, get his car, and pursue inpatient psych on his own. CSW told pt he will continue to follow up with tx team regarding medical clearance.   Pt was recommended inpatient psych by psychiatry. CSW will assist with IVC at this time if needed.   S   Expected Discharge Plan and Services                                                 Social Determinants of Health (SDOH) Interventions    Readmission Risk Interventions No flowsheet data found.

## 2019-10-25 ENCOUNTER — Other Ambulatory Visit: Payer: Self-pay

## 2019-10-25 ENCOUNTER — Emergency Department (HOSPITAL_COMMUNITY)
Admission: EM | Admit: 2019-10-25 | Discharge: 2019-10-27 | Disposition: A | Discharge status: Other Acute Inpatient Hospital | Payer: 59 | Attending: Emergency Medicine | Admitting: Emergency Medicine

## 2019-10-25 ENCOUNTER — Ambulatory Visit (HOSPITAL_COMMUNITY)
Admission: RE | Admit: 2019-10-25 | Discharge: 2019-10-25 | Disposition: A | Discharge status: Home / Self Care | Payer: 59 | Source: Home / Self Care | Attending: Psychiatry | Admitting: Psychiatry

## 2019-10-25 DIAGNOSIS — R5383 Other fatigue: Secondary | ICD-10-CM | POA: Insufficient documentation

## 2019-10-25 DIAGNOSIS — R110 Nausea: Secondary | ICD-10-CM | POA: Insufficient documentation

## 2019-10-25 DIAGNOSIS — R109 Unspecified abdominal pain: Secondary | ICD-10-CM | POA: Insufficient documentation

## 2019-10-25 DIAGNOSIS — F1020 Alcohol dependence, uncomplicated: Secondary | ICD-10-CM | POA: Diagnosis not present

## 2019-10-25 DIAGNOSIS — R454 Irritability and anger: Secondary | ICD-10-CM | POA: Insufficient documentation

## 2019-10-25 DIAGNOSIS — F17210 Nicotine dependence, cigarettes, uncomplicated: Secondary | ICD-10-CM | POA: Insufficient documentation

## 2019-10-25 DIAGNOSIS — F10229 Alcohol dependence with intoxication, unspecified: Secondary | ICD-10-CM | POA: Diagnosis not present

## 2019-10-25 DIAGNOSIS — F329 Major depressive disorder, single episode, unspecified: Secondary | ICD-10-CM | POA: Insufficient documentation

## 2019-10-25 DIAGNOSIS — Z0489 Encounter for examination and observation for other specified reasons: Secondary | ICD-10-CM | POA: Diagnosis present

## 2019-10-25 DIAGNOSIS — R45851 Suicidal ideations: Secondary | ICD-10-CM | POA: Diagnosis not present

## 2019-10-25 DIAGNOSIS — F419 Anxiety disorder, unspecified: Secondary | ICD-10-CM | POA: Insufficient documentation

## 2019-10-25 DIAGNOSIS — G479 Sleep disorder, unspecified: Secondary | ICD-10-CM | POA: Insufficient documentation

## 2019-10-25 DIAGNOSIS — Z79899 Other long term (current) drug therapy: Secondary | ICD-10-CM | POA: Diagnosis not present

## 2019-10-25 DIAGNOSIS — F10920 Alcohol use, unspecified with intoxication, uncomplicated: Secondary | ICD-10-CM

## 2019-10-25 DIAGNOSIS — R450 Nervousness: Secondary | ICD-10-CM | POA: Insufficient documentation

## 2019-10-25 DIAGNOSIS — Z20822 Contact with and (suspected) exposure to covid-19: Secondary | ICD-10-CM | POA: Insufficient documentation

## 2019-10-25 DIAGNOSIS — F1024 Alcohol dependence with alcohol-induced mood disorder: Secondary | ICD-10-CM | POA: Insufficient documentation

## 2019-10-25 NOTE — ED Triage Notes (Signed)
Patient sent from [facility] to be medically cleared. Patient reports upper abdominal pain, which he believes is directly related to his pancreatitis.
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
## 2019-10-26 ENCOUNTER — Inpatient Hospital Stay (EMERGENCY_DEPARTMENT_HOSPITAL): Admission: AD | Admit: 2019-10-26 | Payer: 59 | Source: Intra-hospital | Admitting: Psychiatry

## 2019-10-26 LAB — CBC WITH DIFFERENTIAL/PLATELET
Abs Immature Granulocytes: 0.04 10*3/uL (ref 0.00–0.07)
Basophils Absolute: 0.1 10*3/uL (ref 0.0–0.1)
Basophils Relative: 1 %
Eosinophils Absolute: 0.2 10*3/uL (ref 0.0–0.5)
Eosinophils Relative: 2 %
HCT: 39.7 % (ref 39.0–52.0)
Hemoglobin: 13.8 g/dL (ref 13.0–17.0)
Immature Granulocytes: 1 %
Lymphocytes Relative: 47 %
Lymphs Abs: 3.7 10*3/uL (ref 0.7–4.0)
MCH: 34.7 pg — ABNORMAL HIGH (ref 26.0–34.0)
MCHC: 34.8 g/dL (ref 30.0–36.0)
MCV: 99.7 fL (ref 80.0–100.0)
Monocytes Absolute: 0.6 10*3/uL (ref 0.1–1.0)
Monocytes Relative: 8 %
Neutro Abs: 3.3 10*3/uL (ref 1.7–7.7)
Neutrophils Relative %: 41 %
Platelets: 268 10*3/uL (ref 150–400)
RBC: 3.98 MIL/uL — ABNORMAL LOW (ref 4.22–5.81)
RDW: 12.6 % (ref 11.5–15.5)
WBC: 7.9 10*3/uL (ref 4.0–10.5)
nRBC: 0 % (ref 0.0–0.2)

## 2019-10-26 LAB — COMPREHENSIVE METABOLIC PANEL
ALT: 37 U/L (ref 0–44)
AST: 40 U/L (ref 15–41)
Albumin: 4.2 g/dL (ref 3.5–5.0)
Alkaline Phosphatase: 97 U/L (ref 38–126)
Anion gap: 10 (ref 5–15)
BUN: 12 mg/dL (ref 6–20)
CO2: 23 mmol/L (ref 22–32)
Calcium: 9.1 mg/dL (ref 8.9–10.3)
Chloride: 104 mmol/L (ref 98–111)
Creatinine, Ser: 0.74 mg/dL (ref 0.61–1.24)
GFR calc Af Amer: >60 mL/min (ref >60)
GFR calc non Af Amer: >60 mL/min (ref >60)
Glucose, Bld: 112 mg/dL — ABNORMAL HIGH (ref 70–99)
Potassium: 4 mmol/L (ref 3.5–5.1)
Sodium: 137 mmol/L (ref 135–145)
Total Bilirubin: 0.7 mg/dL (ref 0.3–1.2)
Total Protein: 7.1 g/dL (ref 6.5–8.1)

## 2019-10-26 LAB — RAPID URINE DRUG SCREEN, HOSP PERFORMED
Amphetamines: NOT DETECTED
Barbiturates: NOT DETECTED
Benzodiazepines: NOT DETECTED
Cocaine: NOT DETECTED
Opiates: NOT DETECTED
Tetrahydrocannabinol: NOT DETECTED

## 2019-10-26 LAB — ACETAMINOPHEN LEVEL: Acetaminophen (Tylenol), Serum: <10 ug/mL — ABNORMAL LOW (ref 10–30)

## 2019-10-26 LAB — LIPASE, BLOOD: Lipase: 32 U/L (ref 11–51)

## 2019-10-26 LAB — SARS CORONAVIRUS 2 BY RT PCR (HOSPITAL ORDER, PERFORMED IN ~~LOC~~ HOSPITAL LAB): SARS Coronavirus 2: NEGATIVE

## 2019-10-26 LAB — ETHANOL: Alcohol, Ethyl (B): 161 mg/dL — ABNORMAL HIGH (ref <10)

## 2019-10-26 MED ORDER — THIAMINE HCL 100 MG PO TABS
100.0000 mg | ORAL_TABLET | Freq: Every day | ORAL | Status: DC
  Administered 2019-10-26 – 2019-10-27 (×2): 100 mg via ORAL
  Filled 2019-10-26 (×2): qty 1

## 2019-10-26 MED ORDER — NICOTINE 21 MG/24HR TD PT24
21.0000 mg | MEDICATED_PATCH | Freq: Once | TRANSDERMAL | Status: AC
  Administered 2019-10-26: 21 mg via TRANSDERMAL
  Filled 2019-10-26: qty 1

## 2019-10-26 MED ORDER — LORAZEPAM 2 MG/ML IJ SOLN
1.0000 mg | Freq: Once | INTRAMUSCULAR | Status: AC
  Administered 2019-10-26: 1 mg via INTRAMUSCULAR
  Filled 2019-10-26: qty 1

## 2019-10-26 MED ORDER — ESTRADIOL 0.1 MG/24HR TD PTWK
0.1000 mg | MEDICATED_PATCH | TRANSDERMAL | Status: DC
  Filled 2019-10-26: qty 1

## 2019-10-26 MED ORDER — LORAZEPAM 1 MG PO TABS: 0.0000 mg | ORAL_TABLET | Freq: Two times a day (BID) | ORAL | Status: DC

## 2019-10-26 MED ORDER — ONDANSETRON 4 MG PO TBDP
4.0000 mg | ORAL_TABLET | Freq: Three times a day (TID) | ORAL | Status: DC | PRN
  Administered 2019-10-26 – 2019-10-27 (×2): 4 mg via ORAL
  Filled 2019-10-26 (×2): qty 1

## 2019-10-26 MED ORDER — IBUPROFEN 200 MG PO TABS
600.0000 mg | ORAL_TABLET | Freq: Three times a day (TID) | ORAL | Status: DC | PRN
  Administered 2019-10-27: 600 mg via ORAL
  Filled 2019-10-26: qty 3

## 2019-10-26 MED ORDER — LORAZEPAM 2 MG/ML IJ SOLN: 0.0000 mg | Freq: Four times a day (QID) | INTRAMUSCULAR | Status: DC

## 2019-10-26 MED ORDER — LORAZEPAM 1 MG PO TABS
0.0000 mg | ORAL_TABLET | Freq: Four times a day (QID) | ORAL | Status: DC
  Administered 2019-10-26 (×3): 2 mg via ORAL
  Administered 2019-10-27: 1 mg via ORAL
  Filled 2019-10-26 (×2): qty 2
  Filled 2019-10-26 (×3): qty 1

## 2019-10-26 MED ORDER — CALCIUM CARBONATE ANTACID 500 MG PO CHEW
1.0000 | CHEWABLE_TABLET | Freq: Once | ORAL | Status: AC
  Administered 2019-10-26: 200 mg via ORAL
  Filled 2019-10-26: qty 1

## 2019-10-26 MED ORDER — SPIRONOLACTONE 25 MG PO TABS
50.0000 mg | ORAL_TABLET | Freq: Two times a day (BID) | ORAL | Status: DC
  Administered 2019-10-26 – 2019-10-27 (×3): 50 mg via ORAL
  Filled 2019-10-26 (×4): qty 2

## 2019-10-26 MED ORDER — LORAZEPAM 1 MG PO TABS
2.0000 mg | ORAL_TABLET | Freq: Once | ORAL | Status: AC
  Administered 2019-10-26: 2 mg via ORAL
  Filled 2019-10-26: qty 2

## 2019-10-26 MED ORDER — ATENOLOL 25 MG PO TABS
25.0000 mg | ORAL_TABLET | Freq: Every day | ORAL | Status: DC
  Administered 2019-10-26 – 2019-10-27 (×2): 25 mg via ORAL
  Filled 2019-10-26 (×2): qty 1

## 2019-10-26 MED ORDER — ALUM & MAG HYDROXIDE-SIMETH 200-200-20 MG/5ML PO SUSP
30.0000 mL | Freq: Four times a day (QID) | ORAL | Status: DC | PRN
  Administered 2019-10-26: 30 mL via ORAL
  Filled 2019-10-26: qty 30

## 2019-10-26 MED ORDER — DIPHENHYDRAMINE HCL 25 MG PO CAPS
25.0000 mg | ORAL_CAPSULE | Freq: Once | ORAL | Status: AC
  Administered 2019-10-26: 25 mg via ORAL
  Filled 2019-10-26: qty 1

## 2019-10-26 MED ORDER — THIAMINE HCL 100 MG/ML IJ SOLN: 100.0000 mg | Freq: Every day | INTRAMUSCULAR | Status: DC

## 2019-10-26 MED ORDER — LORAZEPAM 2 MG/ML IJ SOLN: 0.0000 mg | Freq: Two times a day (BID) | INTRAMUSCULAR | Status: DC

## 2019-10-26 NOTE — ED Notes (Signed)
Patient arrived to unit in street clothes.  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

## 2019-10-26 NOTE — ED Notes (Signed)
Phone call for report rerouted back to [facility]. AC will call back with information for patient admission.

## 2019-10-26 NOTE — ED Notes (Signed)
Called BH AC to see update on DC and rooms availble. No update at this time. Pt and all belongings moved to room TCU room 32.
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
## 2019-10-26 NOTE — ED Notes (Signed)
This patient has been asleep for a while and when he woke up he stated that he had horrible dreams and he is hallucinating and he is nauseated.  Patient is calm as can be and does not appear to be in any distress.  His vitals are stable and I dont believe that he needs any additional detox medication at this time.

## 2019-10-26 NOTE — Progress Notes (Signed)
Received Nicolas [name] asleep in bed with the sitter at the bedside. He woke up with several complaints of withdrawal symptoms and feeling anxious. He received a snack of his choice and was medicated with Ativan per order. He slept throughout the night with one interruption before he drifted back to sleep. He is concerned about his car sitting in the parking lot with his personal belongings in the car. He requested to leave and take care of his car and return.

## 2019-10-26 NOTE — ED Provider Notes (Signed)
[facility]-EMERGENCY DEPT Provider Note   CSN: [id number] Arrival date & time: 10/25/19  2155     History Chief Complaint  Patient presents with  . Abdominal Pain  . Medical Clearance    Nicolas [name] is a 54 y.o. adult with a history of alcohol use disorder, pancreatitis, depression, tobacco use disorder who presents to the emergency department from behavioral health for medical clearance.  In brief, the patient called a close friend earlier today from a parking lot.  The friend would not let the patient speak to his wife because there was concern that the patient was acutely intoxicated.  The patient then drove home and called the police stating that he did not want to live and needed help with his alcohol use.  The patient reports improving suicidal thoughts since arrival in the ER.  No HI or auditory visual hallucinations.  The patient endorses upper abdominal pain and nausea.  No fever, chills, vomiting, diarrhea, constipation, urinary complaints, cough, or shortness of breath.  The patient is endorsing upper abdominal pain.  There is concern for recurrent appendicitis as the patient was recently admitted from 5/9-5/11 before leaving AMA.  The patient reports drinking approximately 8 beers daily.  Last drink was at 16:00.  No history of alcohol withdrawal seizures or DTs.  No headache, agitation, tremor, confusion.   Patient identifies as a transgender male and is currently on spironolactone and estradiol patches.  Patient states that any pronouns are acceptable, but prefers that she series.  The history is provided by the patient and medical records. No language interpreter was used.       Past Medical History:  Diagnosis Date  . Alcohol abuse     Patient Active Problem List   Diagnosis Date Noted  . Major depressive disorder, recurrent episode, severe (HCC) 10/21/2019  . 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 Acute pancreatitis 10/20/2019  .  Alcohol use disorder, severe, dependence (HCC) 10/16/2019    Past Surgical History:  Procedure Laterality Date  . NASAL SEPTUM SURGERY         No family history on file.  Social History   Tobacco Use  . Smoking status: Current Every Day Smoker    Packs/day: 0.50    Types: Cigarettes  . Smokeless tobacco: Never Used  Substance Use Topics  . Alcohol use: Yes    Alcohol/week: 6.0 standard drinks    Types: 6 Cans of beer per week    Comment: daily use  . Drug use: Not Currently    Types: Cocaine    Comment: stopped 01/2019    Home Medications Prior to Admission medications   Medication Sig Start Date End Date Taking? Authorizing Provider  atenolol (TENORMIN) 25 MG tablet Take 25 mg by mouth daily. 08/31/19   [provider]  bismuth subsalicylate (PEPTO BISMOL) 262 MG/15ML suspension Take 30 mLs by mouth every 6 (six) hours as needed for diarrhea or loose stools.    [provider]  estradiol (CLIMARA - DOSED IN MG/24 HR) 0.1 mg/24hr patch Place 0.1 mg onto the skin once a week. 10/06/19   [provider]  hydrOXYzine (ATARAX/VISTARIL) 25 MG tablet Take 25 mg by mouth at bedtime as needed for anxiety (sleep).  06/27/19   [provider]  MELATONIN PO Take 2 tablets by mouth at bedtime.    [provider]  Multiple Vitamin (MULTIVITAMIN WITH MINERALS) TABS tablet Take 1 tablet by mouth daily.    [provider]  Probiotic  Product (PROBIOTIC PO) Take 1 capsule by mouth daily.    [provider]  spironolactone (ALDACTONE) 50 MG tablet Take 50 mg by mouth 2 (two) times daily. 08/22/19   [provider]  thiamine 100 MG tablet Take 1 tablet (100 mg total) by mouth daily. Patient not taking: Reported on 10/18/2019 10/16/19   Bethann Berkshire, MD    Allergies    Caffeine  Review of Systems   Review of Systems  Constitutional: Negative for activity change, chills and fever.  Respiratory: Negative for cough, shortness  of breath and wheezing.   Cardiovascular: Negative for chest pain.  Gastrointestinal: Positive for abdominal pain and nausea. Negative for blood in stool, diarrhea and vomiting.  Genitourinary: Negative for dysuria and urgency.  Musculoskeletal: Negative for back pain, myalgias, neck pain and neck stiffness.  Skin: Negative for rash.  Allergic/Immunologic: Negative for immunocompromised state.  Neurological: Negative for seizures, syncope, weakness, numbness and headaches.  Psychiatric/Behavioral: Positive for dysphoric mood and suicidal ideas. Negative for confusion and hallucinations.    Physical Exam Updated Vital Signs BP 117/83 (BP Location: Right Arm)   Pulse 73   Temp 98.8 F (37.1 C) (Oral)   Resp 18   Ht 5\' 9"  (1.753 m)   Wt 81.6 kg   SpO2 93%   BMI 26.58 kg/m   Physical Exam Vitals and nursing note reviewed.  Constitutional:      General: Nicolas [name] is not in acute distress.    Appearance: Nicolas [name] is not ill-appearing, toxic-appearing or diaphoretic.  HENT:     Head: Normocephalic.  Eyes:     Conjunctiva/sclera: Conjunctivae normal.  Cardiovascular:     Rate and Rhythm: Normal rate and regular rhythm.     Heart sounds: No murmur. No friction rub. No gallop.   Pulmonary:     Effort: Pulmonary effort is normal. No respiratory distress.     Breath sounds: No stridor. No wheezing, rhonchi or rales.  Chest:     Chest wall: No tenderness.  Abdominal:     General: There is no distension.     Palpations: Abdomen is soft. There is no mass.     Tenderness: There is abdominal tenderness. There is no right CVA tenderness, left CVA tenderness, guarding or rebound.     Hernia: No hernia is present.     Comments: Mild tenderness palpation the epigastric region.  No rebound or guarding.  Normoactive bowel sounds.  Abdomen is soft and nondistended.  No tenderness over McBurney's point.  Negative Murphy sign.  No CVA tenderness bilaterally.  Musculoskeletal:      Cervical back: Neck supple.  Skin:    General: Skin is warm.     Findings: No rash.  Neurological:     Mental Status: Nicolas [name] is alert.     Comments: No tremor or atelectasis noted.  Alert and oriented x4.  Psychiatric:        Attention and Perception: Nicolas [name] does not perceive auditory or visual hallucinations.        Mood and Affect: Mood normal. Affect is not flat or angry.        Speech: Speech is rapid and pressured.        Behavior: Behavior is hyperactive. Behavior is not combative. Behavior is cooperative.        Thought Content: Thought content does not include homicidal ideation. Thought content does not include homicidal or suicidal plan.     Comments: Hyperverbal.     ED Results /  Procedures / Treatments   Labs (all labs ordered are listed, but only abnormal results are displayed) Labs Reviewed  COMPREHENSIVE METABOLIC PANEL - Abnormal; Notable for the following components:      Result Value   Glucose, Bld 112 (*)    All other components within normal limits  ETHANOL - Abnormal; Notable for the following components:   Alcohol, Ethyl (B) 161 (*)    All other components within normal limits  CBC WITH DIFFERENTIAL/PLATELET - Abnormal; Notable for the following components:   RBC 3.98 (*)    MCH 34.7 (*)    All other components within normal limits  ACETAMINOPHEN LEVEL - Abnormal; Notable for the following components:   Acetaminophen (Tylenol), Serum <10 (*)    All other components within normal limits  SARS CORONAVIRUS 2 BY RT PCR (HOSPITAL ORDER, PERFORMED IN [facility])  RAPID URINE DRUG SCREEN, HOSP PERFORMED  LIPASE, BLOOD    EKG None  Radiology No results found.  Procedures Procedures (including critical care time)  Medications Ordered in ED Medications  LORazepam (ATIVAN) injection 0-4 mg ( Intravenous See Alternative 10/26/19 0043)    Or  LORazepam (ATIVAN) tablet 0-4 mg (2 mg Oral Given 10/26/19 0043)  LORazepam (ATIVAN)  injection 0-4 mg (has no administration in time range)    Or  LORazepam (ATIVAN) tablet 0-4 mg (has no administration in time range)  thiamine tablet 100 mg (has no administration in time range)    Or  thiamine (B-1) injection 100 mg (has no administration in time range)  alum & mag hydroxide-simeth (MAALOX/MYLANTA) 200-200-20 MG/5ML suspension 30 mL (30 mLs Oral Given 10/26/19 0043)  ibuprofen (ADVIL) tablet 600 mg (has no administration in time range)  nicotine (NICODERM CQ - dosed in mg/24 hours) patch 21 mg (21 mg Transdermal Patch Applied 10/26/19 0138)  diphenhydrAMINE (BENADRYL) capsule 25 mg (25 mg Oral Given 10/26/19 0138)    ED Course  I have reviewed the triage vital signs and the nursing notes.  Pertinent labs & imaging results that were available during my care of the patient were reviewed by me and considered in my medical decision making (see chart for details).    MDM Rules/Calculators/A&P                      54 year old male with a history of alcohol use disorder, pancreatitis, depression, tobacco use disorder presenting from behavioral health with medical clearance.  Patient has been accepted for inpatient treatment once medically cleared.  Patient endorses alcohol use earlier today.  Last drink at 16:00.  No evidence of withdrawal on my exam.  CIWA protocol was placed.  Abdominal exam with minimal tenderness to palpation in the epigastric region.  No peritoneal signs.  Lipase is normal.  Transaminases and bilirubin are normal.  Ethanol level is elevated in the 160s.  However, clinically the patient does not appear intoxicated.  Patient has been given a nicotine patch, Benadryl for sleep, and started on CIWA protocol.  COVID-19 test is negative.  Reviewed note from behavioral health and patient has a bed at [facility] once medically cleared.    Pt medically cleared at this time. Psych hold orders and home med orders placed. Please see psych team notes for further documentation of  care/dispo. Pt stable at time of med clearance.    Final Clinical Impression(s) / ED Diagnoses Final diagnoses:  Acute alcoholic intoxication without complication (HCC)  Suicidal ideation    Rx / DC Orders  ED Discharge Orders    None       Joanne Gavel, PA-C 10/26/19 Storey, Donald, MD 10/26/19 [phone]

## 2019-10-26 NOTE — ED Notes (Signed)
Spoke with [facility], patient to go to 300. Will call for report.

## 2019-10-26 NOTE — ED Notes (Signed)
Per [facility], patient needs a private room due to gender identity. Room will not be available until there is a d/c in the AM.

## 2019-10-27 DIAGNOSIS — F1020 Alcohol dependence, uncomplicated: Secondary | ICD-10-CM

## 2019-10-27 NOTE — ED Notes (Signed)
Pt DC off unit to home per provider. Pt alert, calm, cooperative, no s/s of distress. DC information given to and reviewed with pt, pt acknowledged understanding. Belongings given to pt. Pt ambulatory off unit , escorted by RN. Pt given bus pass, will transport by bus.

## 2019-10-27 NOTE — Discharge Instructions (Signed)
Suicidal Feelings: How to Help Yourself Suicide is when you end your own life. There are many things you can do to help yourself feel better when struggling with these feelings. Many services and people are available to support you and others who struggle with similar feelings.  If you ever feel like you may hurt yourself or others, or have thoughts about taking your own life, get help right away. To get help:  Call your local emergency services (911 in the [place]  [employer]'s health and human services helpline (211 in the [place]  Go to your nearest emergency department.  Call a suicide hotline to speak with a trained counselor. The following suicide hotlines are available in the [place]: ? 1-800-273-TALK (1-800-273-8255). ? 1-800-SUICIDE (1-800-784-2433). ? 1-888-628-9454. This is a hotline for Spanish speakers. ? 1-800-799-4889. This is a hotline for TTY users. ? 1-866-4-U-TREVOR (1-866-488-7386). This is a hotline for lesbian, gay, bisexual, transgender, or questioning youth. ? For a list of hotlines in [place], visit www.suicide.org/hotlines/international/[place]-suicide-hotlines.html  Contact a crisis center or a local suicide prevention center. To find a crisis center or suicide prevention center: ? Call your local hospital, clinic, community service organization, mental health center, social service provider, or health department. Ask for help with connecting to a crisis center. ? For a list of crisis centers in the [place], visit: suicidepreventionlifeline.org ? For a list of crisis centers in [place], visit: suicideprevention.ca How to help yourself feel better   Promise yourself that you will not do anything extreme when you have suicidal feelings. Remember, there is hope. Many people have gotten through suicidal thoughts and feelings, and you can too. If you have had these feelings before, remind yourself that you can get through them again.  Let family, friends,  teachers, or counselors know how you are feeling. Try not to separate yourself from those who care about you and want to help you. Talk with someone every day, even if you do not feel sociable. Face-to-face conversation is best to help them understand your feelings.  Contact a mental health care provider and work with this person regularly.  Make a safety plan that you can follow during a crisis. Include phone numbers of suicide prevention hotlines, mental health professionals, and trusted friends and family members you can call during an emergency. Save these numbers on your phone.  If you are thinking of taking a lot of medicine, give your medicine to someone who can give it to you as prescribed. If you are on antidepressants and are concerned you will overdose, tell your health care provider so that he or she can give you safer medicines.  Try to stick to your routines. Follow a schedule every day. Make self-care a priority.  Make a list of realistic goals, and cross them off when you achieve them. Accomplishments can give you a sense of worth.  Wait until you are feeling better before doing things that you find difficult or unpleasant.  Do things that you have always enjoyed to take your mind off your feelings. Try reading a book, or listening to or playing music. Spending time outside, in nature, may help you feel better. Follow these instructions at home:   Visit your primary health care provider every year for a checkup.  Work with a mental health care provider as needed.  Eat a well-balanced diet, and eat regular meals.  Get plenty of rest.  Exercise if you are able. Just 30 minutes of exercise each day can   help you feel better.  Take over-the-counter and prescription medicines only as told by your health care provider. Ask your mental health care provider about the possible side effects of any medicines you are taking.  Do not use alcohol or drugs, and remove these substances  from your home.  Remove weapons, poisons, knives, and other deadly items from your home. General recommendations  Keep your living space well lit.  When you are feeling well, write yourself a letter with tips and support that you can read when you are not feeling well.  Remember that life's difficulties can be sorted out with help. Conditions can be treated, and you can learn behaviors and ways of thinking that will help you. Where to find more information  National Suicide Prevention Lifeline: www.suicidepreventionlifeline.org  Hopeline: www.hopeline.com  American Foundation for Suicide Prevention: www.afsp.org  The Trevor Project (for lesbian, gay, bisexual, transgender, or questioning youth): www.thetrevorproject.org Contact a health care provider if:  You feel as though you are a burden to others.  You feel agitated, angry, vengeful, or have extreme mood swings.  You have withdrawn from family and friends. Get help right away if:  You are talking about suicide or wishing to die.  You start making plans for how to commit suicide.  You feel that you have no reason to live.  You start making plans for putting your affairs in order, saying goodbye, or giving your possessions away.  You feel guilt, shame, or unbearable pain, and it seems like there is no way out.  You are frequently using drugs or alcohol.  You are engaging in risky behaviors that could lead to death. If you have any of these symptoms, get help right away. Call emergency services, go to your nearest emergency department or crisis center, or call a suicide crisis helpline. Summary  Suicide is when you take your own life.  Promise yourself that you will not do anything extreme when you have suicidal feelings.  Let family, friends, teachers, or counselors know how you are feeling.  Get help right away if you feel as though life is getting too tough to handle and you are thinking about suicide. This  information is not intended to replace advice given to you by your health care provider. Make sure you discuss any questions you have with your health care provider. Document Revised: 09/20/2018 Document Reviewed: 01/10/2017 Elsevier Patient Education  2020 [employer]  

## 2019-10-27 NOTE — Consult Note (Addendum)
[facility] Psych ED Discharge  10/27/2019 6:24 PM Nicolas [name]  MRN:  [id number] Principal Problem: Alcohol intoxication with mood disorder Discharge Diagnoses: Alcohol use disorder  Subjective: "I feel a lot better."  Patient seen and evaluated in person by this provider.  She reports he came to the emergency room to get assistance for his alcohol use and told them that she was suicidal to get help as she was unable to get help without this element.  She states she feels a lot better after not drinking for 3 days and denies any withdrawal symptoms, no tremors in hands extended or other withdrawal symptoms noted.  Denies history of withdrawal seizures.  No suicidal/homicidal ideations, hallucinations, mania, or other concerning factors.  She is concerned about his car being at a local hotel and worried that it will be to and wants to leave.  She plans on following up with [facility] outpatient for therapy and medications if needed.  SHe does have surgery on Wednesday to begin transgendering and does not want to miss this.  Dr. Jannifer Franklin is reviewed this client and concurs with the plan to discharge.  Total Time spent with patient: 1 hour  Past Psychiatric History: depression, alcohol use d/o  Past Medical History:  Past Medical History:  Diagnosis Date   Alcohol abuse     Past Surgical History:  Procedure Laterality Date   NASAL SEPTUM SURGERY     Family History: No family history on file. Family Psychiatric  History: none Social History:  Social History   Substance and Sexual Activity  Alcohol Use Yes   Alcohol/week: 6.0 standard drinks   Types: 6 Cans of beer per week   Comment: daily use     Social History   Substance and Sexual Activity  Drug Use Not Currently   Types: Cocaine   Comment: stopped 01/2019    Social History   Socioeconomic History   Marital status: Single    Spouse name: Not on file   Number of children: Not on file   Years of education: Not on file   Highest  education level: Not on file  Occupational History   Not on file  Tobacco Use   Smoking status: Current Every Day Smoker    Packs/day: 0.50    Types: Cigarettes   Smokeless tobacco: Never Used  Substance and Sexual Activity   Alcohol use: Yes    Alcohol/week: 6.0 standard drinks    Types: 6 Cans of beer per week    Comment: daily use   Drug use: Not Currently    Types: Cocaine    Comment: stopped 01/2019   Sexual activity: Not on file  Other Topics Concern   Not on file  Social History Narrative   Not on file   Social Determinants of Health   Financial Resource Strain:    Difficulty of Paying Living Expenses:   Food Insecurity:    Worried About Programme researcher, broadcasting/film/video in the Last Year:    Barista in the Last Year:   Transportation Needs:    Freight forwarder (Medical):    Lack of Transportation (Non-Medical):   Physical Activity:    Days of Exercise per Week:    Minutes of Exercise per Session:   Stress:    Feeling of Stress :   Social Connections:    Frequency of Communication with Friends and Family:    Frequency of Social Gatherings with Friends and Family:    Attends Religious Services:  Active Member of Clubs or Organizations:    Attends Engineer, structural:    Marital Status:     Has this patient used any form of tobacco in the last 30 days? (Cigarettes, Smokeless Tobacco, Cigars, and/or Pipes) A prescription for an FDA-approved tobacco cessation medication was offered at discharge and the patient refused  Current Medications: No current facility-administered medications for this encounter.   Current Outpatient Medications  Medication Sig Dispense Refill   atenolol (TENORMIN) 25 MG tablet Take 25 mg by mouth daily.     bismuth subsalicylate (PEPTO BISMOL) 262 MG/15ML suspension Take 30 mLs by mouth every 6 (six) hours as needed for diarrhea or loose stools.     estradiol (CLIMARA - DOSED IN MG/24 HR) 0.1 mg/24hr patch Place 0.1 mg onto  the skin once a week.     hydrOXYzine (ATARAX/VISTARIL) 25 MG tablet Take 25 mg by mouth at bedtime as needed for anxiety (sleep).      MELATONIN PO Take 2 tablets by mouth at bedtime.     Multiple Vitamin (MULTIVITAMIN WITH MINERALS) TABS tablet Take 1 tablet by mouth daily.     Probiotic Product (PROBIOTIC PO) Take 1 capsule by mouth daily.     spironolactone (ALDACTONE) 50 MG tablet Take 50 mg by mouth 2 (two) times daily.     Facility-Administered Medications Ordered in Other Encounters  Medication Dose Route Frequency Provider Last Rate Last Admin   alum & mag hydroxide-simeth (MAALOX/MYLANTA) 200-200-20 MG/5ML suspension 30 mL  30 mL Oral Q6H PRN McDonald, Mia A, PA-C   30 mL at 10/26/19 0043   atenolol (TENORMIN) tablet 25 mg  25 mg Oral Daily McDonald, Mia A, PA-C   25 mg at 10/27/19 9937   estradiol (CLIMARA - Dosed in mg/24 hr) patch 0.1 mg  0.1 mg Transdermal Weekly McDonald, Mia A, PA-C       ibuprofen (ADVIL) tablet 600 mg  600 mg Oral Q8H PRN McDonald, Mia A, PA-C   600 mg at 10/27/19 0118   LORazepam (ATIVAN) injection 0-4 mg  0-4 mg Intravenous Q6H McDonald, Mia A, PA-C       Or   LORazepam (ATIVAN) tablet 0-4 mg  0-4 mg Oral Q6H McDonald, Mia A, PA-C   1 mg at 10/27/19 0816   [START ON 10/28/2019] LORazepam (ATIVAN) injection 0-4 mg  0-4 mg Intravenous Q12H McDonald, Mia A, PA-C       Or   [START ON 10/28/2019] LORazepam (ATIVAN) tablet 0-4 mg  0-4 mg Oral Q12H McDonald, Mia A, PA-C       ondansetron (ZOFRAN-ODT) disintegrating tablet 4 mg  4 mg Oral Q8H PRN Alvira Monday, MD   4 mg at 10/27/19 0118   spironolactone (ALDACTONE) tablet 50 mg  50 mg Oral BID McDonald, Mia A, PA-C   50 mg at 10/27/19 1696   thiamine tablet 100 mg  100 mg Oral Daily McDonald, Mia A, PA-C   100 mg at 10/27/19 7893   Or   thiamine (B-1) injection 100 mg  100 mg Intravenous Daily McDonald, Mia A, PA-C       PTA Medications: No medications prior to admission.    Musculoskeletal: Strength &  Muscle Tone: within normal limits Gait & Station: normal Patient leans: N/A  Psychiatric Specialty Exam: Physical Exam Vitals and nursing note reviewed.  Constitutional:      Appearance: Normal appearance.  HENT:     Head: Normocephalic.     Nose: Nose normal.  Cardiovascular:  Pulses: Normal pulses.  Musculoskeletal:        General: Normal range of motion.  Neurological:     Mental Status: Nicolas [name] is alert and oriented to person, place, and time.     Review of Systems  Psychiatric/Behavioral: The patient is nervous/anxious.   All other systems reviewed and are negative.   There were no vitals taken for this visit.There is no height or weight on file to calculate BMI.  General Appearance: Casual  Eye Contact:  Good  Speech:  Normal Rate  Volume:  Normal  Mood:  Anxious  Affect:  Congruent  Thought Process:  Coherent and Descriptions of Associations: Intact  Orientation:  Full (Time, Place, and Person)  Thought Content:  WDL and Logical  Suicidal Thoughts:  No  Homicidal Thoughts:  No  Memory:  Immediate;   Good Recent;   Good Remote;   Good  Judgement:  Fair  Insight:  Fair  Psychomotor Activity:  Normal  Concentration:  Concentration: Good and Attention Span: Good  Recall:  Good  Fund of Knowledge:  Good  Language:  Good  Akathisia:  No  Handed:  Right  AIMS (if indicated):     Assets:  Leisure Time Physical Health Resilience  ADL's:  Intact  Cognition:  WNL  Sleep:        Demographic Factors:  Male, Caucasian, Living alone and Unemployed  Loss Factors: NA  Historical Factors: NA  Risk Reduction Factors:   Sense of responsibility to family and Positive social support  Continued Clinical Symptoms:  Anxiety, mild  Cognitive Features That Contribute To Risk:  None    Suicide Risk:  Minimal: No identifiable suicidal ideation.  Patients presenting with no risk factors but with morbid ruminations; may be classified as minimal risk based  on the severity of the depressive symptoms     Plan Of Care/Follow-up recommendations:  Alcohol induced mood disorder: -Follow-up with [facility] outpatient services -Attend 12-step programs with obtainment of a sponsor -Social work consult placed for assistance for social issues Activity:  as tolerated Diet:  heart healthy diet  Disposition: discharge home Waylan Boga, NP 10/27/2019, 6:24 PM Patient seen face-to-face for psychiatric evaluation, chart reviewed and case discussed with the physician extender and developed treatment plan. Reviewed the information documented and agree with the treatment plan. Corena Pilgrim, MD

## 2019-10-30 ENCOUNTER — Ambulatory Visit (HOSPITAL_COMMUNITY)
Admission: AD | Admit: 2019-10-30 | Discharge: 2019-10-30 | Disposition: A | Discharge status: Home / Self Care | Payer: 59 | Source: Home / Self Care | Attending: Psychiatry | Admitting: Psychiatry

## 2019-10-30 DIAGNOSIS — F10180 Alcohol abuse with alcohol-induced anxiety disorder: Secondary | ICD-10-CM | POA: Insufficient documentation

## 2019-10-30 DIAGNOSIS — K7010 Alcoholic hepatitis without ascites: Secondary | ICD-10-CM | POA: Diagnosis not present

## 2019-10-30 DIAGNOSIS — Z590 Homelessness: Secondary | ICD-10-CM | POA: Insufficient documentation

## 2019-10-30 MED ORDER — THIAMINE HCL 100 MG PO TABS
100.00 | ORAL_TABLET | ORAL | Status: DC
Start: 2019-10-30 — End: 2019-10-30

## 2019-10-30 NOTE — H&P (Signed)
Behavioral Health Medical Screening Exam  Nicolas [name] is an 54 y.o. adult.male who presented to [facility] as a a walk-in, voluntarily. Patient is requesting detox. He reported a history of alcohol abuse starting at the age of 23. Reported drinking alcohol daily with last use at 11:00 today. Reported drinking two beers and a blue seltzer. Described withdrawal symptoms as," anxiety." Stated he normally goes for detox, is put on ativan and, " it makes me feel great" then following detox, he goes out and drink again. He denied seizures or DT's related to his alcohol abuse. Reported a history cocaine use although stated he had not used cocaine in 1 year. He denied other substance abuse or use. Reported that he has had substance abuse rehab or detox in the past with last rehab in [place] last July, August, and October. Per chart review, he was seen at [facility] yesterday for alcohol intoxification. With further chart review, he has presented to [facility] and the ED multiple tomes within the past month for alcohol abuse.  Resources  were provided during those times along with peer support. Patient stated on 10/27/2019 when evaluated that," he came to the emergency room to get assistance for his alcohol use and told them that she was suicidal to get help as she was unable to get help without this element." Patient denied current SI, HI, or psychosis. Patient denied history od self- mutilating behaviors. Patient noted being homeless during the assessment.  Total Time spent with patient: 20 minutes  Psychiatric Specialty Exam: Physical Exam  Vitals reviewed. Constitutional: Nicolas [name] is oriented to person, place, and time.  Neurological: Nicolas [name] is alert and oriented to person, place, and time.    Review of Systems  Psychiatric/Behavioral:       Alcohol abuse      Blood pressure 109/77, pulse 78, temperature 98.3 F (36.8 C), temperature source Oral, resp. rate 18, SpO2 100 %.There is no  height or weight on file to calculate BMI.  General Appearance: Casual  Eye Contact:  Good  Speech:  Clear and Coherent and Normal Rate  Volume:  Normal  Mood:  Euthymic  Affect:  Appropriate  Thought Process:  Coherent, Linear and Descriptions of Associations: Intact  Orientation:  Full (Time, Place, and Person)  Thought Content:  Logical  Suicidal Thoughts:  No  Homicidal Thoughts:  No  Memory:  Immediate;   Fair Recent;   Fair  Judgement:  Fair  Insight:  Shallow  Psychomotor Activity:  Normal  Concentration: Concentration: Fair and Attention Span: Fair  Recall:  [employer] of Knowledge:Fair  Language: Good  Akathisia:  Negative  Handed:  Right  AIMS (if indicated):     Assets:  Communication Skills Desire for Improvement Resilience  Sleep:       Musculoskeletal: Strength & Muscle Tone: within normal limits Gait & Station: normal Patient leans: N/A  Blood pressure 109/77, pulse 78, temperature 98.3 F (36.8 C), temperature source Oral, resp. rate 18, SpO2 100 %.  Recommendations:  Based on my evaluation the patient does not appear to have an emergency medical condition.   Patient denied current SI, HI or psychosis. There was no evidence of imminent risk to self or others at present. Patient did not meet criteria for psychiatric inpatient admission and when this was dicussed, patient stated, "then I will just go to another hospital." Patients issues are related to alcohol abuse. We dicussed residential substance abuse treatment programs and patient stated," I have  things to do and I have to be out by the 29th. If I was in [place] they would help me find a place." I advised patient that we could provide resources for detox and rehab. Patient seemed to be upset and again talked about going to another hospital. Patient was still provided with resources. It is highly likely that patient will present to another ED. NP Shavon Rankin attempted to call [facility] charge nurse, [phone]., to  advise them that patient may present to the their ED.     Denzil Magnuson, NP 10/30/2019, 1:39 PM

## 2019-10-30 NOTE — BH Assessment (Addendum)
Assessment Note  Nicolas [name] is an 54 y.o. adult. Patient with history of alcohol use. He presents to [facility] as a walk-in. Patient is self referred. He presents to [facility] with a request for detox-alcohol. Patient reports that he started drinking alcohol at the age of 54 yrs old. He drinks daily. He drinks 7-8 cans of beer daily. His last drink was 11am today. He also reports a history of cocaine use. Last use was July, 2020. Longest period of sobriety is 2 months. Negative consequences of use: Financial, Personal relationships, Work / Youth worker. Current withdrawal Symptoms: Irritability, Nausea / Vomiting, Sweats, Tremors. No history of seizures or DT's.  Patient denies SI. No history of SI. No self mutilating behaviors. No family history of mental illness. Denies current HI. No history of harm and/or aggressive behaviors. Stressors include homelessness. Pt acknowledges symptoms including crying spells, social withdrawal, loss of interest in usual pleasures, fatigue, irritability, decreased concentration, decreased sleep. 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 He says he began drinking heavily and went to treatment three time in 2020 for alcohol use at [facility]. He describes coming to [facility] to start over but the person he was living with was mentally unstable and he had to leave. Pt reports he is currently homeless and staying in [place].   Pt is casually dressed, alert and oriented x4. He appears intoxicated and speaks in a slurred tone, at moderate volume and normal pace. Motor behavior appears normal however Pt had episodes of holding his abdomen and complaining of pain. Eye contact is fair. Pt's mood is depressed and anxious, affect is congruent with mood. Thought process is coherent and relevant. There is no indication Pt is not currently responding to internal stimuli or experiencing delusional thought content. Pt was cooperative  throughout assessment. He is requesting inpatient detox treatment.  Diagnosis: Depressive Disorder and   Past Medical History:  Past Medical History:  Diagnosis Date  . Alcohol abuse     Past Surgical History:  Procedure Laterality Date  . NASAL SEPTUM SURGERY      Family History: No family history on file.  Social History:  reports that Nicolas [name] has been smoking cigarettes. Nicolas [name] has been smoking about 0.50 packs per day. Nicolas [name] has never used smokeless tobacco. Nicolas [name] reports current alcohol use of about 6.0 standard drinks of alcohol per week. Nicolas [name] reports previous drug use. Drug: Cocaine.  Additional Social History:  Alcohol / Drug Use Pain Medications: Denies abuse Prescriptions: Denies abuse Over the Counter: Denies abuse History of alcohol / drug use?: Yes Longest period of sobriety (when/how long): 2 months Negative Consequences of Use: Financial, Personal relationships, Work / School Withdrawal Symptoms: Irritability, Nausea / Vomiting, Sweats, Tremors Substance #1 Name of Substance 1: Alcohol 1 - Age of First Use: 15 1 - Amount (size/oz): 7-8 cans of beer 1 - Frequency: Daily 1 - Duration: Ongoing for years 1 - Last Use / Amount: 10/25/19, 4 beers Substance #2 Name of Substance 2: Cocaine 2 - Age of First Use: 54 yrs old 2 - Amount (size/oz): unk 2 - Frequency: unk 2 - Duration: unk 2 - Last Use / Amount: July 2020  CIWA: CIWA-Ar BP: 109/77 Pulse Rate: 78 COWS:    Allergies:  Allergies  Allergen Reactions  . Caffeine Palpitations    Home Medications: (Not in a hospital admission)   OB/GYN Status:  No LMP recorded.  General Assessment  Data Location of Assessment: [facility] Assessment Services TTS Assessment: In system Is this a Tele or Face-to-Face Assessment?: Face-to-Face Is this an Initial Assessment or a Re-assessment for this encounter?: Initial Assessment Patient Accompanied by:: N/A Language Other than  English: No Living Arrangements: Homeless/Shelter What gender do you identify as?: Male Marital status: Single Maiden name: (n/a) Pregnancy Status: No Living Arrangements: Alone Can pt return to current living arrangement?: Yes Admission Status: Voluntary Is patient capable of signing voluntary admission?: Yes Referral Source: Other Insurance type: (Self Pay )     Crisis Care Plan Living Arrangements: Alone Legal Guardian: Other: Name of Psychiatrist: None Name of Therapist: [facility]  Education Status Is patient currently in school?: No Is the patient employed, unemployed or receiving disability?: Unemployed  Risk to self with the past 6 months Suicidal Ideation: No Has patient been a risk to self within the past 6 months prior to admission? : No Suicidal Intent: No Has patient had any suicidal intent within the past 6 months prior to admission? : No Is patient at risk for suicide?: No Suicidal Plan?: No Has patient had any suicidal plan within the past 6 months prior to admission? : No Specify Current Suicidal Plan: (no plan ) Access to Means: No Specify Access to Suicidal Means: (n/a) What has been your use of drugs/alcohol within the last 12 months?: (patient reports daily alcohol use and hx of cocaine use ) Previous Attempts/Gestures: No How many times?: (0) Other Self Harm Risks: (none reported) Triggers for Past Attempts: None known Intentional Self Injurious Behavior: None Family Suicide History: No Recent stressful life event(s): Financial Problems, Job Loss, Loss (Comment) Persecutory voices/beliefs?: No Depression: Yes Depression Symptoms: Despondent, Feeling angry/irritable, Feeling worthless/self pity, Loss of interest in usual pleasures, Guilt, Fatigue, Isolating, Tearfulness, Insomnia Substance abuse history and/or treatment for substance abuse?: Yes Suicide prevention information given to non-admitted patients: Not applicable  Risk to Others within  the past 6 months Homicidal Ideation: No Does patient have any lifetime risk of violence toward others beyond the six months prior to admission? : No Thoughts of Harm to Others: No Current Homicidal Intent: No Current Homicidal Plan: No Access to Homicidal Means: No Identified Victim: (None reported) History of harm to others?: No Assessment of Violence: None Noted Violent Behavior Description: (patient denies history of violence) Does patient have access to weapons?: No Criminal Charges Pending?: No Does patient have a court date: No Is patient on probation?: No  Psychosis Hallucinations: None noted Delusions: None noted  Mental Status Report Appearance/Hygiene: Other (Comment)(Casually dressed) Eye Contact: Fair Motor Activity: Freedom of movement Speech: Slurred Level of Consciousness: Alert, Other (Comment)(Intoxicated) Mood: Depressed, Anxious, Guilty Affect: Anxious, Depressed Anxiety Level: Moderate Thought Processes: Coherent Judgement: Impaired Orientation: Person, Place, Time, Situation Obsessive Compulsive Thoughts/Behaviors: None  Cognitive Functioning Concentration: Decreased Memory: Recent Intact, Remote Intact Is patient IDD: No Insight: Fair Impulse Control: Fair Appetite: Fair Have you had any weight changes? : No Change Sleep: Decreased Total Hours of Sleep: (5 hours of sleep per day) Vegetative Symptoms: None  ADLScreening [facility] Assessment Services) Patient's cognitive ability adequate to safely complete daily activities?: Yes Patient able to express need for assistance with ADLs?: Yes Independently performs ADLs?: Yes (appropriate for developmental age)  Prior Inpatient Therapy Prior Inpatient Therapy: Yes Prior Therapy Dates: 2020 Prior Therapy Facilty/Provider(s): [facility] Reason for Treatment: substance abuse tx  Prior Outpatient Therapy Prior Outpatient Therapy: Yes Prior Therapy Dates: ongoing Prior Therapy  Facilty/Provider(s): [facility] Reason for  Treatment: substance abuse tx Does patient have an ACCT team?: No Does patient have Intensive In-House Services?  : No Does patient have [facility] services? : No Does patient have P4CC services?: No  ADL Screening (condition at time of admission) Patient's cognitive ability adequate to safely complete daily activities?: Yes Is the patient deaf or have difficulty hearing?: No Does the patient have difficulty seeing, even when wearing glasses/contacts?: No Does the patient have difficulty concentrating, remembering, or making decisions?: No Patient able to express need for assistance with ADLs?: Yes Does the patient have difficulty dressing or bathing?: No Independently performs ADLs?: Yes (appropriate for developmental age) Does the patient have difficulty walking or climbing stairs?: No Weakness of Legs: None Weakness of Arms/Hands: None             Advance Directives (For Healthcare) Does Patient Have a Medical Advance Directive?: No Nutrition Screen- [facility] Adult/[facility] Patient's home diet: Regular Has the patient recently lost weight without trying?: No Has the patient been eating poorly because of a decreased appetite?: No Malnutrition Screening Tool Score: 0        Disposition: Per Oletta Lamas, NP, patient does not meet treatment for inpatient treatment. Patient denies SI, HI, and AVH's. He is requesting detox. However, does not met criteria for detox. Patient recommended to follow up with referrals for detox residential treatment programs.   Patient at [facility] between 10/25/19 to 10/27/2019 please see notes by provider(s) and TTS for reference.  Disposition Initial Assessment Completed for this Encounter: Yes Disposition of Patient: Movement to [facility] or [facility] ED Patient refused recommended treatment: No Patient referred to: Other (Comment)(Pt will be observed overnight for safety and stability)  On Site Evaluation by:   Reviewed  with Physician:    Waldon Merl 10/30/2019 2:43 PM

## 2019-10-31 ENCOUNTER — Emergency Department (HOSPITAL_COMMUNITY)
Admission: EM | Admit: 2019-10-31 | Discharge: 2019-10-31 | Disposition: A | Discharge status: Home / Self Care | Payer: 59 | Source: Home / Self Care | Attending: Emergency Medicine | Admitting: Emergency Medicine

## 2019-10-31 ENCOUNTER — Other Ambulatory Visit: Payer: Self-pay

## 2019-10-31 DIAGNOSIS — R1013 Epigastric pain: Secondary | ICD-10-CM

## 2019-10-31 DIAGNOSIS — R112 Nausea with vomiting, unspecified: Secondary | ICD-10-CM | POA: Insufficient documentation

## 2019-10-31 DIAGNOSIS — R197 Diarrhea, unspecified: Secondary | ICD-10-CM

## 2019-10-31 DIAGNOSIS — F17210 Nicotine dependence, cigarettes, uncomplicated: Secondary | ICD-10-CM | POA: Insufficient documentation

## 2019-10-31 DIAGNOSIS — Z79899 Other long term (current) drug therapy: Secondary | ICD-10-CM | POA: Insufficient documentation

## 2019-10-31 LAB — CBC WITH DIFFERENTIAL/PLATELET
Abs Immature Granulocytes: 0.04 10*3/uL (ref 0.00–0.07)
Basophils Absolute: 0.1 10*3/uL (ref 0.0–0.1)
Basophils Relative: 2 %
Eosinophils Absolute: 0.1 10*3/uL (ref 0.0–0.5)
Eosinophils Relative: 1 %
HCT: 41.8 % (ref 39.0–52.0)
Hemoglobin: 14.5 g/dL (ref 13.0–17.0)
Immature Granulocytes: 1 %
Lymphocytes Relative: 38 %
Lymphs Abs: 2.7 10*3/uL (ref 0.7–4.0)
MCH: 34.1 pg — ABNORMAL HIGH (ref 26.0–34.0)
MCHC: 34.7 g/dL (ref 30.0–36.0)
MCV: 98.4 fL (ref 80.0–100.0)
Monocytes Absolute: 0.6 10*3/uL (ref 0.1–1.0)
Monocytes Relative: 8 %
Neutro Abs: 3.6 10*3/uL (ref 1.7–7.7)
Neutrophils Relative %: 50 %
Platelets: 258 10*3/uL (ref 150–400)
RBC: 4.25 MIL/uL (ref 4.22–5.81)
RDW: 12.1 % (ref 11.5–15.5)
WBC: 7.1 10*3/uL (ref 4.0–10.5)
nRBC: 0 % (ref 0.0–0.2)

## 2019-10-31 LAB — COMPREHENSIVE METABOLIC PANEL
ALT: 28 U/L (ref 0–44)
AST: 33 U/L (ref 15–41)
Albumin: 4 g/dL (ref 3.5–5.0)
Alkaline Phosphatase: 76 U/L (ref 38–126)
Anion gap: 12 (ref 5–15)
BUN: 15 mg/dL (ref 6–20)
CO2: 29 mmol/L (ref 22–32)
Calcium: 9.7 mg/dL (ref 8.9–10.3)
Chloride: 93 mmol/L — ABNORMAL LOW (ref 98–111)
Creatinine, Ser: 0.79 mg/dL (ref 0.61–1.24)
GFR calc Af Amer: >60 mL/min (ref >60)
GFR calc non Af Amer: >60 mL/min (ref >60)
Glucose, Bld: 146 mg/dL — ABNORMAL HIGH (ref 70–99)
Potassium: 3.6 mmol/L (ref 3.5–5.1)
Sodium: 134 mmol/L — ABNORMAL LOW (ref 135–145)
Total Bilirubin: 0.9 mg/dL (ref 0.3–1.2)
Total Protein: 6.6 g/dL (ref 6.5–8.1)

## 2019-10-31 LAB — LIPASE, BLOOD: Lipase: 36 U/L (ref 11–51)

## 2019-10-31 LAB — ETHANOL: Alcohol, Ethyl (B): 167 mg/dL — ABNORMAL HIGH (ref <10)

## 2019-10-31 MED ORDER — LORAZEPAM 1 MG PO TABS: 0.0000 mg | ORAL_TABLET | Freq: Two times a day (BID) | ORAL | Status: DC

## 2019-10-31 MED ORDER — SODIUM CHLORIDE 0.9 % IV BOLUS: 1000.0000 mL | Freq: Once | INTRAVENOUS | Status: DC

## 2019-10-31 MED ORDER — SUCRALFATE 1 G PO TABS
1.0000 g | ORAL_TABLET | Freq: Three times a day (TID) | ORAL | 0 refills | Status: DC
Start: 2019-10-31 — End: 2020-06-17

## 2019-10-31 MED ORDER — ONDANSETRON 4 MG PO TBDP
4.0000 mg | ORAL_TABLET | Freq: Three times a day (TID) | ORAL | 0 refills | Status: DC | PRN
Start: 2019-10-31 — End: 2020-01-26

## 2019-10-31 MED ORDER — THIAMINE HCL 100 MG PO TABS
100.0000 mg | ORAL_TABLET | Freq: Every day | ORAL | Status: DC
  Administered 2019-10-31: 100 mg via ORAL
  Filled 2019-10-31: qty 1

## 2019-10-31 MED ORDER — LORAZEPAM 1 MG PO TABS
0.0000 mg | ORAL_TABLET | Freq: Four times a day (QID) | ORAL | Status: DC
  Administered 2019-10-31: 2 mg via ORAL
  Filled 2019-10-31: qty 2

## 2019-10-31 MED ORDER — CALCIUM CARBONATE ANTACID 500 MG PO CHEW
1.0000 | CHEWABLE_TABLET | Freq: Once | ORAL | Status: AC
  Administered 2019-10-31: 200 mg via ORAL
  Filled 2019-10-31: qty 1

## 2019-10-31 MED ORDER — ONDANSETRON HCL 4 MG/2ML IJ SOLN
4.0000 mg | Freq: Once | INTRAMUSCULAR | Status: DC
  Filled 2019-10-31: qty 2

## 2019-10-31 MED ORDER — LORAZEPAM 2 MG/ML IJ SOLN
0.0000 mg | Freq: Four times a day (QID) | INTRAMUSCULAR | Status: DC
  Filled 2019-10-31: qty 1

## 2019-10-31 MED ORDER — ONDANSETRON 4 MG PO TBDP
4.0000 mg | ORAL_TABLET | Freq: Once | ORAL | Status: AC
  Administered 2019-10-31: 4 mg via ORAL
  Filled 2019-10-31: qty 1

## 2019-10-31 MED ORDER — LORAZEPAM 1 MG PO TABS
1.0000 mg | ORAL_TABLET | Freq: Once | ORAL | Status: AC
  Administered 2019-10-31: 1 mg via ORAL
  Filled 2019-10-31: qty 1

## 2019-10-31 MED ORDER — LORAZEPAM 2 MG/ML IJ SOLN: 0.0000 mg | Freq: Two times a day (BID) | INTRAMUSCULAR | Status: DC

## 2019-10-31 MED ORDER — THIAMINE HCL 100 MG/ML IJ SOLN
100.0000 mg | Freq: Every day | INTRAMUSCULAR | Status: DC
  Filled 2019-10-31: qty 2

## 2019-10-31 MED ORDER — FAMOTIDINE 20 MG PO TABS
20.0000 mg | ORAL_TABLET | Freq: Two times a day (BID) | ORAL | 0 refills | Status: DC
Start: 2019-10-31 — End: 2020-01-26

## 2019-10-31 MED ORDER — PANTOPRAZOLE SODIUM 20 MG PO TBEC
20.0000 mg | DELAYED_RELEASE_TABLET | Freq: Every day | ORAL | 1 refills | Status: DC
Start: 2019-10-31 — End: 2020-05-12

## 2019-10-31 NOTE — Discharge Instructions (Addendum)
  Diet: Start with a clear liquid diet, progressed to a full liquid diet, and then bland solids as you are able. Please adhere to the enclosed dietary suggestions.  In general, avoid NSAIDs (i.e. ibuprofen, naproxen, etc.), caffeine, alcohol, spicy foods, fatty foods, or any other foods that seem to cause your symptoms to arise.  Protonix: Take this medication daily, 20-30 minutes prior to your first meal, for the next 8 weeks.  Continue to take this medication even if you begin to feel better.  Pepcid: Take this medication twice a day for the next 5 days.  Sucralfate: Sucralfate (generic for Carafate) is meant to soothe symptoms of abdominal discomfort and reflux.  Follow-up: Please follow-up with your primary care provider on this matter.  You may also need follow-up with a gastroenterologist as well.  Call to make an appointment.  Return: Return to the ED for significantly worsening symptoms, persistent vomiting, persistent fever, vomiting blood, blood in the stools, dark stools, or any other major concerns.  For prescription assistance, may try using prescription discount sites or apps, such as goodrx.com

## 2019-10-31 NOTE — ED Notes (Signed)
x3 unsuccessful attempts to get an IV

## 2019-10-31 NOTE — ED Triage Notes (Signed)
Pt c/o abd pains with n/v. Reports drinks ETOh and has pancreatitis.

## 2019-10-31 NOTE — ED Provider Notes (Signed)
[facility]-EMERGENCY DEPT Provider Note   CSN: [id number] Arrival date & time: 10/31/19  1218     History Chief Complaint  Patient presents with  . Abdominal Pain  . Emesis    Nicolas [name] is a 54 y.o. adult.  HPI      Nicolas [name] is a 54 y.o. adult, with a history of alcohol abuse, pancreatitis, depression, presenting to the ED with abdominal pain since at least at least May 7. Accompanied by nausea and nonbilious, nonbloody vomiting with 3 episodes today. States he has had diarrhea for the last 2 months.  Pain is epigastric, currently 3/10, described as a discomfort, nonradiating. States pain has improved since onset. It was more intense and radiating to his back. Drinks about 8 beers a day with last alcohol around 10 AM this morning.  He states, "My insurance will not approve any alcohol recovery programs, but I am here for my pancreatitis symptoms, not for my alcoholism."  Denies illicit drug use.  Denies SI/HI. Denies AVH.  Denies fever/chills, hematochezia/melena, chest pain, shortness of breath, syncope, urinary symptoms, tremors, confusion, diaphoresis, or any other complaints.  Past Medical History:  Diagnosis Date  . Alcohol abuse     Patient Active Problem List   Diagnosis Date Noted  . Major depressive disorder, recurrent episode, severe (HCC) 10/21/2019  . 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 Acute pancreatitis 10/20/2019  . Alcohol use disorder, severe, dependence (HCC) 10/16/2019    Past Surgical History:  Procedure Laterality Date  . NASAL SEPTUM SURGERY         No family history on file.  Social History   Tobacco Use  . Smoking status: Current Every Day Smoker    Packs/day: 0.50    Types: Cigarettes  . Smokeless tobacco: Never Used  Substance Use Topics  . Alcohol use: Yes    Alcohol/week: 6.0 standard drinks    Types: 6 Cans of beer per week    Comment: daily use  . Drug use: Not Currently    Types: Cocaine   Comment: stopped 01/2019    Home Medications Prior to Admission medications   Medication Sig Start Date End Date Taking? Authorizing Provider  atenolol (TENORMIN) 25 MG tablet Take 25 mg by mouth daily. 08/31/19  Yes [provider]  bismuth subsalicylate (PEPTO BISMOL) 262 MG/15ML suspension Take 30 mLs by mouth every 6 (six) hours as needed for diarrhea or loose stools.   Yes [provider]  estradiol (CLIMARA - DOSED IN MG/24 HR) 0.1 mg/24hr patch Place 0.1 mg onto the skin once a week. 10/06/19  Yes [provider]  hydrOXYzine (ATARAX/VISTARIL) 25 MG tablet Take 25 mg by mouth at bedtime as needed for anxiety (sleep).  06/27/19  Yes [provider]  MELATONIN PO Take 2 tablets by mouth at bedtime.   Yes [provider]  Multiple Vitamin (MULTIVITAMIN WITH MINERALS) TABS tablet Take 1 tablet by mouth daily.   Yes [provider]  Probiotic Product (PROBIOTIC PO) Take 1 capsule by mouth daily.   Yes [provider]  spironolactone (ALDACTONE) 50 MG tablet Take 50 mg by mouth 2 (two) times daily. 08/22/19  Yes [provider]  famotidine (PEPCID) 20 MG tablet Take 1 tablet (20 mg total) by mouth 2 (two) times daily for 5 days. 10/31/19 11/05/19  Riyanshi Wahab C, PA-C  ondansetron (ZOFRAN ODT) 4 MG disintegrating tablet Take 1 tablet (4 mg total) by mouth every 8 (eight) hours as needed for  nausea or vomiting. 10/31/19   Latana Colin C, PA-C  pantoprazole (PROTONIX) 20 MG tablet Take 1 tablet (20 mg total) by mouth daily. 10/31/19 12/30/19  Saliou Barnier C, PA-C  sucralfate (CARAFATE) 1 g tablet Take 1 tablet (1 g total) by mouth 4 (four) times daily -  with meals and at bedtime for 14 days. 10/31/19 11/14/19  Elky Funches, Ines Bloomer C, PA-C    Allergies    Caffeine  Review of Systems   Review of Systems  Constitutional: Negative for chills, diaphoresis and fever.  Respiratory: Negative for cough and shortness of breath.   Cardiovascular: Negative  for chest pain.  Gastrointestinal: Positive for abdominal pain, diarrhea, nausea and vomiting. Negative for blood in stool.  Genitourinary: Negative for difficulty urinating, dysuria and flank pain.  Musculoskeletal: Negative for back pain.  Neurological: Negative for dizziness, tremors and syncope.  All other systems reviewed and are negative.   Physical Exam Updated Vital Signs BP 125/88   Pulse 80   Temp 98.1 F (36.7 C)   Resp 18   SpO2 99%   Physical Exam Vitals and nursing note reviewed.  Constitutional:      General: Nicolas [name] is not in acute distress.    Appearance: Nicolas [name] is well-developed. Nicolas [name] is not diaphoretic.     Comments: Patient appears overall relaxed, lying back on the bed.  HENT:     Head: Normocephalic and atraumatic.     Mouth/Throat:     Mouth: Mucous membranes are moist.     Pharynx: Oropharynx is clear.  Eyes:     Conjunctiva/sclera: Conjunctivae normal.  Cardiovascular:     Rate and Rhythm: Normal rate and regular rhythm.     Pulses: Normal pulses.          Radial pulses are 2+ on the right side and 2+ on the left side.       Posterior tibial pulses are 2+ on the right side and 2+ on the left side.     Heart sounds: Normal heart sounds.     Comments: Tactile temperature in the extremities appropriate and equal bilaterally. Pulmonary:     Effort: Pulmonary effort is normal. No respiratory distress.     Breath sounds: Normal breath sounds.  Abdominal:     Palpations: Abdomen is soft.     Tenderness: There is no abdominal tenderness. There is no guarding.     Comments: No real tenderness noted in the abdomen.  Patient has no reaction to palpation, even in the epigastric region.  Musculoskeletal:     Cervical back: Neck supple.     Right lower leg: No edema.     Left lower leg: No edema.  Lymphadenopathy:     Cervical: No cervical adenopathy.  Skin:    General: Skin is warm and dry.  Neurological:     Mental Status:  Nicolas [name] is alert.  Psychiatric:        Mood and Affect: Mood and affect normal.        Speech: Speech normal.        Behavior: Behavior normal.     ED Results / Procedures / Treatments   Labs (all labs ordered are listed, but only abnormal results are displayed) Labs Reviewed  COMPREHENSIVE METABOLIC PANEL - Abnormal; Notable for the following components:      Result Value   Sodium 134 (*)    Chloride 93 (*)    Glucose, Bld 146 (*)    All other components  within normal limits  ETHANOL - Abnormal; Notable for the following components:   Alcohol, Ethyl (B) 167 (*)    All other components within normal limits  CBC WITH DIFFERENTIAL/PLATELET - Abnormal; Notable for the following components:   MCH 34.1 (*)    All other components within normal limits  LIPASE, BLOOD    EKG None  Radiology No results found.  Procedures Procedures (including critical care time)  Medications Ordered in ED Medications  sodium chloride 0.9 % bolus 1,000 mL (has no administration in time range)  LORazepam (ATIVAN) injection 0-4 mg ( Intravenous See Alternative 10/31/19 1410)    Or  LORazepam (ATIVAN) tablet 0-4 mg (2 mg Oral Given 10/31/19 1410)  LORazepam (ATIVAN) injection 0-4 mg (has no administration in time range)    Or  LORazepam (ATIVAN) tablet 0-4 mg (has no administration in time range)  thiamine tablet 100 mg (100 mg Oral Given 10/31/19 1411)    Or  thiamine (B-1) injection 100 mg ( Intravenous See Alternative 10/31/19 1411)  ondansetron (ZOFRAN-ODT) disintegrating tablet 4 mg (4 mg Oral Given 10/31/19 1411)  calcium carbonate (TUMS - dosed in mg elemental calcium) chewable tablet 200 mg of elemental calcium (200 mg of elemental calcium Oral Given 10/31/19 1517)  LORazepam (ATIVAN) tablet 1 mg (1 mg Oral Given 10/31/19 1645)    ED Course  I have reviewed the triage vital signs and the nursing notes.  Pertinent labs & imaging results that were available during my care of the  patient were reviewed by me and considered in my medical decision making (see chart for details).  Clinical Course as of Oct 30 1809  Thu Oct 31, 2019  1400 RN states she attempted IV access x3, but catheters would not thread adequately. She states patient is requesting no further IV access attempts, rather would like to try p.o. medications.   [SJ]  1452 Patient's repeat abdominal exam benign.  Nausea has improved.  Tolerating liquids.   [SJ]  1610 Patient again reevaluated.  He states his nausea has returned and is again feeling anxious.  Inquiring as to whether he can have a dose of Ativan before he goes. I advised the patient that he should not drive after taking the Ativan and he voiced understanding.   [SJ]  1724 Patient states the peer support person gave him resources, however, also wanted him to standby because he might be able to get him into a detox program right away.   [SJ]    Clinical Course User Index [SJ] Melisa Donofrio C, PA-C   MDM Rules/Calculators/A&P                      Patient presents complaining of persistent abdominal pain as well as nausea and vomiting.  This is overall improved from previous instances.  He has recently been diagnosed with alcohol pancreatitis. Patient is nontoxic appearing, afebrile, not tachycardic, not tachypneic, not hypotensive, maintains excellent SPO2 on room air, and is in no apparent distress.   I have reviewed the patient's chart to obtain more information.  I reviewed and interpreted the patient's labs. Benign abdominal exams throughout his ED course.  No lipase elevation.  No leukocytosis. Peer support consult ordered for the patient. I advised follow-up with PCP and outpatient alcoholic support resources as a starting point.  Peers support was able to provide additional resources for the patient. He was able to tolerate PO fluids and food prior to discharge.  He also ambulated steadily  and without assistance prior to discharge.  No  noted cognitive deficit.   I brought up the subject of alcohol recovery with the patient as he states he is here for his pancreatitis symptoms, however, patient seems to have alcoholic pancreatitis.  I discussed that he would likely continue to have symptoms or have recurrent pancreatitis for as long as he continues use of alcohol. He became upset and states, "I do not need to be lectured and told that it is all my fault.  I am an alcoholic, which means I have an addiction.  I cannot help it.  It has been proven that alcohol addiction is located in the midbrain and is as strong as the need for food or other basic needs.  I am not stupid.  I am educated about this stuff."   Final Clinical Impression(s) / ED Diagnoses Final diagnoses:  Nausea vomiting and diarrhea  Epigastric pain    Rx / DC Orders ED Discharge Orders         Ordered    pantoprazole (PROTONIX) 20 MG tablet  Daily     10/31/19 1652    famotidine (PEPCID) 20 MG tablet  2 times daily     10/31/19 1652    sucralfate (CARAFATE) 1 g tablet  3 times daily with meals & bedtime     10/31/19 1652    ondansetron (ZOFRAN ODT) 4 MG disintegrating tablet  Every 8 hours PRN     10/31/19 1652           Lorayne Bender, PA-C 10/31/19 1811    Maudie Flakes, MD 11/02/19 1243

## 2019-10-31 NOTE — Patient Outreach (Signed)
CPSS met with Pt an was made aware that he is ready for treatment. CPSS called [facility] in [place] to confirm a bed for Pt. CPSS was informed that Pt can come in an complete the assessment an get into the detox unit.

## 2019-11-02 ENCOUNTER — Other Ambulatory Visit: Payer: Self-pay

## 2019-11-02 ENCOUNTER — Inpatient Hospital Stay (HOSPITAL_COMMUNITY)
Admission: EM | Admit: 2019-11-02 | Discharge: 2019-11-05 | DRG: 432 | Disposition: A | Discharge status: Home / Self Care | Payer: 59 | Attending: Internal Medicine | Admitting: Internal Medicine

## 2019-11-02 ENCOUNTER — Encounter (HOSPITAL_COMMUNITY): Payer: Self-pay | Admitting: Emergency Medicine

## 2019-11-02 DIAGNOSIS — E871 Hypo-osmolality and hyponatremia: Secondary | ICD-10-CM | POA: Diagnosis present

## 2019-11-02 DIAGNOSIS — Z79899 Other long term (current) drug therapy: Secondary | ICD-10-CM | POA: Diagnosis not present

## 2019-11-02 DIAGNOSIS — F17210 Nicotine dependence, cigarettes, uncomplicated: Secondary | ICD-10-CM | POA: Diagnosis present

## 2019-11-02 DIAGNOSIS — Z9119 Patient's noncompliance with other medical treatment and regimen: Secondary | ICD-10-CM | POA: Diagnosis not present

## 2019-11-02 DIAGNOSIS — Z20822 Contact with and (suspected) exposure to covid-19: Secondary | ICD-10-CM | POA: Diagnosis present

## 2019-11-02 DIAGNOSIS — F419 Anxiety disorder, unspecified: Secondary | ICD-10-CM | POA: Diagnosis present

## 2019-11-02 DIAGNOSIS — I10 Essential (primary) hypertension: Secondary | ICD-10-CM | POA: Diagnosis present

## 2019-11-02 DIAGNOSIS — E876 Hypokalemia: Secondary | ICD-10-CM | POA: Diagnosis present

## 2019-11-02 DIAGNOSIS — F1020 Alcohol dependence, uncomplicated: Secondary | ICD-10-CM | POA: Diagnosis present

## 2019-11-02 DIAGNOSIS — Z590 Homelessness: Secondary | ICD-10-CM | POA: Diagnosis not present

## 2019-11-02 DIAGNOSIS — K219 Gastro-esophageal reflux disease without esophagitis: Secondary | ICD-10-CM | POA: Diagnosis present

## 2019-11-02 DIAGNOSIS — K852 Alcohol induced acute pancreatitis without necrosis or infection: Secondary | ICD-10-CM | POA: Diagnosis present

## 2019-11-02 DIAGNOSIS — K7010 Alcoholic hepatitis without ascites: Secondary | ICD-10-CM | POA: Diagnosis present

## 2019-11-02 DIAGNOSIS — F10229 Alcohol dependence with intoxication, unspecified: Secondary | ICD-10-CM | POA: Diagnosis present

## 2019-11-02 DIAGNOSIS — F332 Major depressive disorder, recurrent severe without psychotic features: Secondary | ICD-10-CM | POA: Diagnosis present

## 2019-11-02 LAB — CBC
HCT: 43.4 % (ref 39.0–52.0)
Hemoglobin: 15.1 g/dL (ref 13.0–17.0)
MCH: 34.8 pg — ABNORMAL HIGH (ref 26.0–34.0)
MCHC: 34.8 g/dL (ref 30.0–36.0)
MCV: 100 fL (ref 80.0–100.0)
Platelets: 275 10*3/uL (ref 150–400)
RBC: 4.34 MIL/uL (ref 4.22–5.81)
RDW: 12.3 % (ref 11.5–15.5)
WBC: 6.8 10*3/uL (ref 4.0–10.5)
nRBC: 0 % (ref 0.0–0.2)

## 2019-11-02 LAB — FOLATE: Folate: 53.7 ng/mL (ref >5.9)

## 2019-11-02 LAB — COMPREHENSIVE METABOLIC PANEL
ALT: 337 U/L — ABNORMAL HIGH (ref 0–44)
AST: 730 U/L — ABNORMAL HIGH (ref 15–41)
Albumin: 4.3 g/dL (ref 3.5–5.0)
Alkaline Phosphatase: 81 U/L (ref 38–126)
Anion gap: 9 (ref 5–15)
BUN: 16 mg/dL (ref 6–20)
CO2: 26 mmol/L (ref 22–32)
Calcium: 8.3 mg/dL — ABNORMAL LOW (ref 8.9–10.3)
Chloride: 98 mmol/L (ref 98–111)
Creatinine, Ser: 0.88 mg/dL (ref 0.61–1.24)
GFR calc Af Amer: >60 mL/min (ref >60)
GFR calc non Af Amer: >60 mL/min (ref >60)
Glucose, Bld: 150 mg/dL — ABNORMAL HIGH (ref 70–99)
Potassium: 3.9 mmol/L (ref 3.5–5.1)
Sodium: 133 mmol/L — ABNORMAL LOW (ref 135–145)
Total Bilirubin: 1.2 mg/dL (ref 0.3–1.2)
Total Protein: 6.7 g/dL (ref 6.5–8.1)

## 2019-11-02 LAB — VITAMIN B12: Vitamin B-12: 1278 pg/mL — ABNORMAL HIGH (ref 180–914)

## 2019-11-02 LAB — PROTIME-INR
INR: 1.7 — ABNORMAL HIGH (ref 0.8–1.2)
Prothrombin Time: 19.3 seconds — ABNORMAL HIGH (ref 11.4–15.2)

## 2019-11-02 LAB — SARS CORONAVIRUS 2 BY RT PCR (HOSPITAL ORDER, PERFORMED IN ~~LOC~~ HOSPITAL LAB): SARS Coronavirus 2: NEGATIVE

## 2019-11-02 LAB — ETHANOL: Alcohol, Ethyl (B): 209 mg/dL — ABNORMAL HIGH (ref <10)

## 2019-11-02 LAB — LIPASE, BLOOD: Lipase: 30 U/L (ref 11–51)

## 2019-11-02 MED ORDER — THIAMINE HCL 100 MG/ML IJ SOLN: 100.0000 mg | Freq: Every day | INTRAMUSCULAR | Status: DC

## 2019-11-02 MED ORDER — DIPHENHYDRAMINE HCL 50 MG/ML IJ SOLN
25.0000 mg | Freq: Once | INTRAMUSCULAR | Status: AC
  Administered 2019-11-02: 25 mg via INTRAVENOUS
  Filled 2019-11-02: qty 1

## 2019-11-02 MED ORDER — SODIUM CHLORIDE 0.9 % IV BOLUS
1000.0000 mL | Freq: Once | INTRAVENOUS | Status: AC
  Administered 2019-11-02: 1000 mL via INTRAVENOUS

## 2019-11-02 MED ORDER — FOLIC ACID 1 MG PO TABS
1.0000 mg | ORAL_TABLET | Freq: Every day | ORAL | Status: DC
  Administered 2019-11-03 – 2019-11-05 (×3): 1 mg via ORAL
  Filled 2019-11-02 (×3): qty 1

## 2019-11-02 MED ORDER — THIAMINE HCL 100 MG PO TABS
100.0000 mg | ORAL_TABLET | Freq: Every day | ORAL | Status: DC
  Administered 2019-11-03 – 2019-11-05 (×3): 100 mg via ORAL
  Filled 2019-11-02 (×4): qty 1

## 2019-11-02 MED ORDER — NICOTINE 14 MG/24HR TD PT24
14.0000 mg | MEDICATED_PATCH | Freq: Every day | TRANSDERMAL | Status: DC
  Administered 2019-11-02 – 2019-11-05 (×4): 14 mg via TRANSDERMAL
  Filled 2019-11-02 (×4): qty 1

## 2019-11-02 MED ORDER — PANTOPRAZOLE SODIUM 40 MG PO TBEC
40.0000 mg | DELAYED_RELEASE_TABLET | Freq: Every day | ORAL | Status: DC
  Administered 2019-11-03: 40 mg via ORAL
  Filled 2019-11-02: qty 1

## 2019-11-02 MED ORDER — OXYCODONE HCL 5 MG PO TABS: 5.0000 mg | ORAL_TABLET | ORAL | Status: DC | PRN

## 2019-11-02 MED ORDER — LORAZEPAM 1 MG PO TABS: 1.0000 mg | ORAL_TABLET | ORAL | Status: DC | PRN

## 2019-11-02 MED ORDER — LORAZEPAM 2 MG/ML IJ SOLN
1.0000 mg | INTRAMUSCULAR | Status: DC | PRN
  Administered 2019-11-02: 2 mg via INTRAVENOUS
  Administered 2019-11-02 – 2019-11-03 (×5): 1 mg via INTRAVENOUS
  Administered 2019-11-03: 2 mg via INTRAVENOUS
  Administered 2019-11-03 (×2): 1 mg via INTRAVENOUS
  Administered 2019-11-04 (×2): 2 mg via INTRAVENOUS
  Administered 2019-11-04: 1 mg via INTRAVENOUS
  Administered 2019-11-04: 2 mg via INTRAVENOUS
  Administered 2019-11-04: 1 mg via INTRAVENOUS
  Administered 2019-11-04: 2 mg via INTRAVENOUS
  Filled 2019-11-02 (×15): qty 1

## 2019-11-02 MED ORDER — ATENOLOL 25 MG PO TABS
25.0000 mg | ORAL_TABLET | Freq: Every day | ORAL | Status: DC
  Administered 2019-11-03 – 2019-11-05 (×3): 25 mg via ORAL
  Filled 2019-11-02 (×3): qty 1

## 2019-11-02 MED ORDER — PROCHLORPERAZINE EDISYLATE 10 MG/2ML IJ SOLN
10.0000 mg | Freq: Once | INTRAMUSCULAR | Status: AC
  Administered 2019-11-02: 10 mg via INTRAVENOUS
  Filled 2019-11-02: qty 2

## 2019-11-02 MED ORDER — ENOXAPARIN SODIUM 40 MG/0.4ML ~~LOC~~ SOLN
40.0000 mg | SUBCUTANEOUS | Status: DC
  Administered 2019-11-02 – 2019-11-04 (×3): 40 mg via SUBCUTANEOUS
  Filled 2019-11-02 (×3): qty 0.4

## 2019-11-02 MED ORDER — SODIUM CHLORIDE 0.9 % IV SOLN: INTRAVENOUS | Status: DC

## 2019-11-02 MED ORDER — LORAZEPAM 2 MG/ML IJ SOLN
1.0000 mg | Freq: Once | INTRAMUSCULAR | Status: AC
  Administered 2019-11-02: 1 mg via INTRAVENOUS
  Filled 2019-11-02: qty 1

## 2019-11-02 MED ORDER — ADULT MULTIVITAMIN W/MINERALS CH
1.0000 | ORAL_TABLET | Freq: Every day | ORAL | Status: DC
  Administered 2019-11-03 – 2019-11-05 (×3): 1 via ORAL
  Filled 2019-11-02 (×3): qty 1

## 2019-11-02 MED ORDER — THIAMINE HCL 100 MG/ML IJ SOLN
Freq: Once | INTRAVENOUS | Status: AC
  Filled 2019-11-02: qty 1000

## 2019-11-02 MED ORDER — ONDANSETRON HCL 4 MG/2ML IJ SOLN
4.0000 mg | Freq: Three times a day (TID) | INTRAMUSCULAR | Status: DC | PRN
  Administered 2019-11-02: 4 mg via INTRAVENOUS
  Filled 2019-11-02: qty 2

## 2019-11-02 MED ORDER — FAMOTIDINE IN NACL 20-0.9 MG/50ML-% IV SOLN
20.0000 mg | Freq: Once | INTRAVENOUS | Status: AC
  Administered 2019-11-02: 20 mg via INTRAVENOUS
  Filled 2019-11-02: qty 50

## 2019-11-02 NOTE — H&P (Signed)
History and Physical    Nicolas [name]  GYK:[id number]  DOB: March 07, 1966  DOA: 11/02/2019  PCP: Aretta Nip, MD Patient coming from: home  Chief Complaint: vomiting, abdominal pain  I have personally briefly reviewed patient's old medical records in [place]  HPI:  Nicolas [name] is a nonbinary identifying birth sex male (on hormone therapy) who is 54 yo with PMH MDD, alcoholic pancreatitis, and ongoing etoh use at home.  Chief complaint is vomiting and abdominal pain.  Eithen states the consumption is approximately 8-9 beers daily for "maintenance". Tobacco use is 1/4 PPD and no illicit drug use.  There are no prescribed controlled substances on the database either. Last drink was Saturday morning prior to presenting to the ER. Lab work-up in the ER notable for: WBC 6.8, hemoglobin 15.1, platelets 275 Na 133, K3.9, CL 98, CO2 26, glucose 150, BUN 16, creatinine 0.88, AST 730, ALT 337, alk phos 81, total bili 1.2 Lipase 30 Ethanol 209 PT 19.3, INR 1.7  Due to elevated LFTs and concern for ensuing withdrawal, patient admitted for further treatment of alcoholic hepatitis and CIWA protocol.    Assessment/Plan: Alcoholic hepatitis - continue IVF (banana bag followed by normal saline) -Trend LFTs and repeat PT/INR in a.m.  Alcohol use disorder, severe, dependence ([place]) -Continue on CIWA protocol -Continue banana bag followed by supplement replacement -Borderline macrocytosis, check folate and B12.  Replete if deficient    Code Status:  Code Status History    Date Active Date Inactive Code Status Order ID Comments User Context   10/26/2019 0014 10/27/2019 1852 Full Code [id number]  Joanne Gavel, PA-C ED   10/20/2019 1406 10/22/2019 1851 Full Code [id number]  Lequita Halt, MD ED   10/20/2019 1339 10/20/2019 1406 Full Code [id number]  Maudie Flakes, MD ED   10/18/2019 1615 10/19/2019 0522 Full Code [id number]  Junious Silk, Sophia, PA-C ED   10/15/2019 2112 10/16/2019 1751 Full  Code [id number]  Virgel Manifold, MD ED   10/11/2019 0649 10/11/2019 2147 Full Code [id number]  Mliss Fritz, NP Inpatient   10/11/2019 0106 10/11/2019 0624 Full Code [id number]  Larene Pickett, PA-C ED   Advance Care Planning Activity      DVT Prophylaxis:enoxaparin (Lovenox) 81m SQ 2 hours prior to surgery then every day Anticipated disposition is to home  History: Past Medical History:  Diagnosis Date  . Alcohol abuse     Past Surgical History:  Procedure Laterality Date  . NASAL SEPTUM SURGERY       reports that JLankford Gutzmerhas been smoking cigarettes. JMacallan Ordhas been smoking about 0.50 packs per day. JGarrin Kirwanhas never used smokeless tobacco. JSadarius Normanreports current alcohol use of about 6.0 standard drinks of alcohol per week. JAadyn Buchheitreports previous drug use. Drug: Cocaine.  Allergies  Allergen Reactions  . Caffeine Palpitations   Family History: No pertinent family history  Home Medications: Prior to Admission medications   Medication Sig Start Date End Date Taking? Authorizing Provider  atenolol (TENORMIN) 25 MG tablet Take 25 mg by mouth daily. 08/31/19   [provider]  bismuth subsalicylate (PEPTO BISMOL) 262 MG/15ML suspension Take 30 mLs by mouth every 6 (six) hours as needed for diarrhea or loose stools.    [provider]  estradiol (CLIMARA - DOSED IN MG/24 HR) 0.1 mg/24hr patch Place 0.1 mg onto the skin once a week. 10/06/19   [provider]  famotidine (PEPCID) 20 MG tablet Take 1 tablet (20  mg total) by mouth 2 (two) times daily for 5 days. 10/31/19 11/05/19  [name], Shawn C, PA-C  hydrOXYzine (ATARAX/VISTARIL) 25 MG tablet Take 25 mg by mouth at bedtime as needed for anxiety (sleep).  06/27/19   [provider]  MELATONIN PO Take 2 tablets by mouth at bedtime.    [provider]  Multiple Vitamin (MULTIVITAMIN WITH MINERALS) TABS tablet Take 1 tablet by mouth daily.    [provider]    ondansetron (ZOFRAN ODT) 4 MG disintegrating tablet Take 1 tablet (4 mg total) by mouth every 8 (eight) hours as needed for nausea or vomiting. 10/31/19   [name], Shawn C, PA-C  pantoprazole (PROTONIX) 20 MG tablet Take 1 tablet (20 mg total) by mouth daily. 10/31/19 12/30/19  [name], Raquel Sarna C, PA-C  Probiotic Product (PROBIOTIC PO) Take 1 capsule by mouth daily.    [provider]  spironolactone (ALDACTONE) 50 MG tablet Take 50 mg by mouth 2 (two) times daily. 08/22/19   [provider]  sucralfate (CARAFATE) 1 g tablet Take 1 tablet (1 g total) by mouth 4 (four) times daily -  with meals and at bedtime for 14 days. 10/31/19 11/14/19  [name], Helane Gunther, PA-C    Review of Systems:  Pertinent items noted in HPI and remainder of comprehensive ROS otherwise negative.  Physical Exam: Vitals:   11/02/19 1419 11/02/19 1602 11/02/19 1615 11/02/19 1630  BP: 103/70 115/79 104/76 101/71  Pulse: 60 62 (!) 55 (!) 57  Resp: _0 Temp:      TempSrc:      SpO2: 95% 95% 95% 93%   General appearance: alert, cooperative and no distress Head: Normocephalic, without obvious abnormality Eyes: EOMI Lungs: clear to auscultation bilaterally Heart: regular rate and rhythm and S1, S2 normal Abdomen: normal findings: bowel sounds normal and soft, non-tender Extremities: no edema Skin: mobility and turgor normal Neurologic: Grossly normal  Labs on Admission:  I have personally reviewed following labs and imaging studies Results for orders placed or performed during the hospital encounter of 11/02/19 (from the past 24 hour(s))  CBC     Status: Abnormal   Collection Time: 11/02/19  2:23 PM  Result Value Ref Range   WBC 6.8 4.0 - 10.5 K/uL   RBC 4.34 4.22 - 5.81 MIL/uL   Hemoglobin 15.1 13.0 - 17.0 g/dL   HCT 43.4 39.0 - 52.0 %   MCV 100.0 80.0 - 100.0 fL   MCH 34.8 (H) 26.0 - 34.0 pg   MCHC 34.8 30.0 - 36.0 g/dL   RDW 12.3 11.5 - 15.5 %   Platelets 275 150 - 400 K/uL   nRBC 0.0 0.0 - 0.2 %   Comprehensive metabolic panel     Status: Abnormal   Collection Time: 11/02/19  2:23 PM  Result Value Ref Range   Sodium 133 (L) 135 - 145 mmol/L   Potassium 3.9 3.5 - 5.1 mmol/L   Chloride 98 98 - 111 mmol/L   CO2 26 22 - 32 mmol/L   Glucose, Bld 150 (H) 70 - 99 mg/dL   BUN 16 6 - 20 mg/dL   Creatinine, Ser 0.88 0.61 - 1.24 mg/dL   Calcium 8.3 (L) 8.9 - 10.3 mg/dL   Total Protein 6.7 6.5 - 8.1 g/dL   Albumin 4.3 3.5 - 5.0 g/dL   AST 730 (H) 15 - 41 U/L   ALT 337 (H) 0 - 44 U/L   Alkaline Phosphatase 81 38 - 126 U/L  Total Bilirubin 1.2 0.3 - 1.2 mg/dL   GFR calc non Af Amer >60 >60 mL/min   GFR calc Af Amer >60 >60 mL/min   Anion gap 9 5 - 15  Lipase, blood     Status: None   Collection Time: 11/02/19  2:23 PM  Result Value Ref Range   Lipase 30 11 - 51 U/L  Ethanol     Status: Abnormal   Collection Time: 11/02/19  2:23 PM  Result Value Ref Range   Alcohol, Ethyl (B) 209 (H) <10 mg/dL  Protime-INR     Status: Abnormal   Collection Time: 11/02/19  3:11 PM  Result Value Ref Range   Prothrombin Time 19.3 (H) 11.4 - 15.2 seconds   INR 1.7 (H) 0.8 - 1.2  SARS Coronavirus 2 by RT PCR (hospital order, performed in [place] hospital lab) Nasopharyngeal Nasopharyngeal Swab     Status: None   Collection Time: 11/02/19  3:35 PM   Specimen: Nasopharyngeal Swab  Result Value Ref Range   SARS Coronavirus 2 NEGATIVE NEGATIVE     Radiological Exams on Admission: No results found. No orders to display    Consults called:  n/a      Dwyane Dee, MD [facility] Pager: Secure chat via [place] pager # : [phone]  If 7PM-7AM, please contact night-coverage [email or web address] Use universal [place] password for that web site. If you do not have the password, please call the hospital operator.  11/02/2019, 6:13 PM

## 2019-11-02 NOTE — Plan of Care (Signed)
  Problem: Education: Goal: Knowledge of General Education information will improve Description Including pain rating scale, medication(s)/side effects and non-pharmacologic comfort measures Outcome: Progressing   

## 2019-11-02 NOTE — Hospital Course (Addendum)
Kelen Laura is a nonbinary identifying birth sex male (on hormone therapy) who is 54 yo with PMH MDD, alcoholic pancreatitis, and ongoing etoh use at home.  Chief complaint is vomiting and abdominal pain.  Arrow states the consumption is approximately 8-9 beers daily for "maintenance". Tobacco use is 1/4 PPD and no illicit drug use.  There are no prescribed controlled substances on the database either. Last drink was Saturday morning prior to presenting to the ER. Lab work-up in the ER notable for: WBC 6.8, hemoglobin 15.1, platelets 275 Na 133, K3.9, CL 98, CO2 26, glucose 150, BUN 16, creatinine 0.88, AST 730, ALT 337, alk phos 81, total bili 1.2 Lipase 30 Ethanol 209 PT 19.3, INR 1.7  Due to elevated LFTs and concern for ensuing withdrawal, patient admitted for further treatment of alcoholic hepatitis and CIWA protocol.

## 2019-11-02 NOTE — ED Triage Notes (Signed)
Pt still vomiting and having abd pains. Reports feel like he is dying. Last ETOh was this morning.

## 2019-11-02 NOTE — ED Notes (Signed)
ED Provider at bedside. 

## 2019-11-02 NOTE — ED Provider Notes (Signed)
[facility]-EM[facility]GENCY DEPT [facility] Emergency Department Provider Note MRN:  [id number]  Arrival date & time: 11/02/19     Chief Complaint   Emesis and Abdominal Pain   History of Present Illness   Nicolas [name] is a 54 y.o. year-old adult with a history of alcohol abuse presenting to the ED with chief complaint of emesis and abdominal pain.  Patient is endorsing general malaise, nausea.  Feels "worse than death".  Admits that he has continued to drink alcohol heavily despite having recent complications with it, namely pancreatitis requiring admission recently.  He explains that his abdominal pain is resolved but continues to feel very unwell, no appetite.  No chest pain or shortness of breath, no other complaints.  Denies SI or HI or AVH but explains that he is very tired of feeling this way.  Review of Systems  A complete 10 system review of systems was obtained and all systems are negative except as noted in the HPI and PMH.   Patient's Health History    Past Medical History:  Diagnosis Date  . Alcohol abuse     Past Surgical History:  Procedure Laterality Date  . NASAL SEPTUM SURGERY      No family history on file.  Social History   Socioeconomic History  . Marital status: Single    Spouse name: Not on file  . Number of children: Not on file  . Years of education: Not on file  . Highest education level: Not on file  Occupational History  . Not on file  Tobacco Use  . Smoking status: Current Every Day Smoker    Packs/day: 0.50    Types: Cigarettes  . Smokeless tobacco: Never Used  Substance and Sexual Activity  . Alcohol use: Yes    Alcohol/week: 6.0 standard drinks    Types: 6 Cans of beer per week    Comment: daily use  . Drug use: Not Currently    Types: Cocaine    Comment: stopped 01/2019  . Sexual activity: Not on file  Other Topics Concern  . Not on file  Social History Narrative  . Not on file   Social Determinants of Health   Financial Resource  Strain:   . Difficulty of Paying Living Expenses:   Food Insecurity:   . Worried About Programme researcher, broadcasting/film/video in the Last Year:   . Barista in the Last Year:   Transportation Needs:   . Freight forwarder (Medical):   Marland Kitchen Lack of Transportation (Non-Medical):   Physical Activity:   . Days of Exercise per Week:   . Minutes of Exercise per Session:   Stress:   . Feeling of Stress :   Social Connections:   . Frequency of Communication with Friends and Family:   . Frequency of Social Gatherings with Friends and Family:   . Attends Religious Services:   . Active Member of Clubs or Organizations:   . Attends Banker Meetings:   Marland Kitchen Marital Status:   Intimate Partner Violence:   . Fear of Current or Ex-Partner:   . Emotionally Abused:   Marland Kitchen Physically Abused:   . Sexually Abused:      Physical Exam   Vitals:   11/02/19 1342 11/02/19 1419  BP: 104/70 103/70  Pulse: 66 60  Resp: 17 17  Temp: 98.1 F (36.7 C)   SpO2: 99% 95%    CONSTITUTIONAL: Well-appearing, NAD NEURO:  Alert and oriented x 3, no focal deficits EYES:  eyes equal and reactive ENT/NECK:  no LAD, no JVD CARDIO: Regular rate, well-perfused, normal S1 and S2 PULM:  CTAB no wheezing or rhonchi GI/GU:  normal bowel sounds, non-distended, non-tender MSK/SPINE:  No gross deformities, no edema SKIN:  no rash, atraumatic PSYCH:  Appropriate speech and behavior  *Additional and/or pertinent findings included in MDM below  Diagnostic and Interventional Summary    EKG Interpretation  Date/Time:    Ventricular Rate:    PR Interval:    QRS Duration:   QT Interval:    QTC Calculation:   R Axis:     Text Interpretation:        Labs Reviewed  CBC - Abnormal; Notable for the following components:      Result Value   MCH 34.8 (*)    All other components within normal limits  COMPREHENSIVE METABOLIC PANEL - Abnormal; Notable for the following components:   Sodium 133 (*)    Glucose, Bld 150  (*)    Calcium 8.3 (*)    AST 730 (*)    ALT 337 (*)    All other components within normal limits  ETHANOL - Abnormal; Notable for the following components:   Alcohol, Ethyl (B) 209 (*)    All other components within normal limits  SARS CORONAVIRUS 2 BY RT PCR (HOSPITAL ORDER, [facility])  LIPASE, BLOOD  PROTIME-INR    No orders to display    Medications  LORazepam (ATIVAN) injection 1 mg (has no administration in time range)  sodium chloride 0.9 % bolus 1,000 mL (0 mLs Intravenous Stopped 11/02/19 1525)  diphenhydrAMINE (BENADRYL) injection 25 mg (25 mg Intravenous Given 11/02/19 1422)  prochlorperazine (COMPAZINE) injection 10 mg (10 mg Intravenous Given 11/02/19 1422)     Procedures  /  Critical Care Procedures  ED Course and Medical Decision Making  I have reviewed the triage vital signs, the nursing notes, and pertinent available records from the EMR.  Listed above are laboratory and imaging tests that I personally ordered, reviewed, and interpreted and then considered in my medical decision making (see below for details).      Soft and nontender abdomen, normal vital signs, mostly endorsing general malaise, fatigue, nausea.  Will recheck labs, provide symptomatic management, reassess.  Labs reveal moderately elevated LFTs likely attributable to alcoholic hepatitis.  Given these results and patient's continued nausea, and now his early withdrawal symptoms, will admit to hospitalist service.  Barth Kirks. Sedonia Small, [place] [email or web address]  Final Clinical Impressions(s) / ED Diagnoses     ICD-10-CM   1. Alcoholic hepatitis without ascites  K70.10     ED Discharge Orders    None       Discharge Instructions Discussed with and Provided to Patient:   Discharge Instructions   None       Maudie Flakes, MD 11/02/19 1535

## 2019-11-02 NOTE — Assessment & Plan Note (Addendum)
-  Continue on CIWA protocol -Continue banana bag followed by supplement replacement -Borderline macrocytosis, check folate and B12.  Replete if deficient

## 2019-11-02 NOTE — ED Notes (Signed)
Hospitalist at bedside 

## 2019-11-02 NOTE — ED Notes (Signed)
Attempted to call report without answer.

## 2019-11-02 NOTE — Assessment & Plan Note (Signed)
-   continue IVF (banana bag followed by normal saline) -Trend LFTs and repeat PT/INR in a.m.

## 2019-11-03 DIAGNOSIS — F332 Major depressive disorder, recurrent severe without psychotic features: Secondary | ICD-10-CM

## 2019-11-03 DIAGNOSIS — K219 Gastro-esophageal reflux disease without esophagitis: Secondary | ICD-10-CM

## 2019-11-03 DIAGNOSIS — K852 Alcohol induced acute pancreatitis without necrosis or infection: Secondary | ICD-10-CM

## 2019-11-03 LAB — COMPREHENSIVE METABOLIC PANEL
ALT: 858 U/L — ABNORMAL HIGH (ref 0–44)
AST: 1860 U/L — ABNORMAL HIGH (ref 15–41)
Albumin: 3.6 g/dL (ref 3.5–5.0)
Alkaline Phosphatase: 77 U/L (ref 38–126)
Anion gap: 6 (ref 5–15)
BUN: 13 mg/dL (ref 6–20)
CO2: 26 mmol/L (ref 22–32)
Calcium: 8.1 mg/dL — ABNORMAL LOW (ref 8.9–10.3)
Chloride: 103 mmol/L (ref 98–111)
Creatinine, Ser: 0.81 mg/dL (ref 0.61–1.24)
GFR calc Af Amer: >60 mL/min (ref >60)
GFR calc non Af Amer: >60 mL/min (ref >60)
Glucose, Bld: 107 mg/dL — ABNORMAL HIGH (ref 70–99)
Potassium: 4.2 mmol/L (ref 3.5–5.1)
Sodium: 135 mmol/L (ref 135–145)
Total Bilirubin: 1.5 mg/dL — ABNORMAL HIGH (ref 0.3–1.2)
Total Protein: 5.8 g/dL — ABNORMAL LOW (ref 6.5–8.1)

## 2019-11-03 LAB — CBC
HCT: 39.5 % (ref 39.0–52.0)
Hemoglobin: 13.5 g/dL (ref 13.0–17.0)
MCH: 34 pg (ref 26.0–34.0)
MCHC: 34.2 g/dL (ref 30.0–36.0)
MCV: 99.5 fL (ref 80.0–100.0)
Platelets: 224 10*3/uL (ref 150–400)
RBC: 3.97 MIL/uL — ABNORMAL LOW (ref 4.22–5.81)
RDW: 12.4 % (ref 11.5–15.5)
WBC: 5.6 10*3/uL (ref 4.0–10.5)
nRBC: 0 % (ref 0.0–0.2)

## 2019-11-03 LAB — GLUCOSE, CAPILLARY: Glucose-Capillary: 125 mg/dL — ABNORMAL HIGH (ref 70–99)

## 2019-11-03 LAB — PHOSPHORUS: Phosphorus: 2.2 mg/dL — ABNORMAL LOW (ref 2.5–4.6)

## 2019-11-03 LAB — MAGNESIUM: Magnesium: 2.1 mg/dL (ref 1.7–2.4)

## 2019-11-03 LAB — PROTIME-INR
INR: 1.5 — ABNORMAL HIGH (ref 0.8–1.2)
Prothrombin Time: 17.8 seconds — ABNORMAL HIGH (ref 11.4–15.2)

## 2019-11-03 MED ORDER — HYDROXYZINE HCL 25 MG PO TABS
25.0000 mg | ORAL_TABLET | Freq: Every evening | ORAL | Status: DC | PRN
  Administered 2019-11-04: 25 mg via ORAL
  Filled 2019-11-03: qty 1

## 2019-11-03 MED ORDER — MORPHINE SULFATE (PF) 2 MG/ML IV SOLN: 1.0000 mg | INTRAVENOUS | Status: DC | PRN

## 2019-11-03 MED ORDER — SUCRALFATE 1 GM/10ML PO SUSP
1.0000 g | Freq: Three times a day (TID) | ORAL | Status: DC
  Administered 2019-11-03 – 2019-11-05 (×7): 1 g via ORAL
  Filled 2019-11-03 (×7): qty 10

## 2019-11-03 MED ORDER — PANTOPRAZOLE SODIUM 40 MG IV SOLR: 40.0000 mg | INTRAVENOUS | Status: DC

## 2019-11-03 MED ORDER — LACTATED RINGERS IV SOLN: INTRAVENOUS | Status: DC

## 2019-11-03 MED ORDER — PREDNISONE 20 MG PO TABS: 40.0000 mg | ORAL_TABLET | Freq: Every day | ORAL | Status: DC

## 2019-11-03 MED ORDER — PREDNISONE 20 MG PO TABS
40.0000 mg | ORAL_TABLET | Freq: Every day | ORAL | Status: DC
  Administered 2019-11-03 – 2019-11-05 (×3): 40 mg via ORAL
  Filled 2019-11-03 (×3): qty 2

## 2019-11-03 MED ORDER — CHLORHEXIDINE GLUCONATE CLOTH 2 % EX PADS
6.0000 | MEDICATED_PAD | Freq: Every day | CUTANEOUS | Status: DC
  Administered 2019-11-03: 6 via TOPICAL

## 2019-11-03 NOTE — Progress Notes (Signed)
PROGRESS NOTE    Mills Mckaig  QMV:[id number] DOB: Jul 27, 1965 DOA: 11/02/2019 PCP: Clayborn Heron, MD    Brief Narrative:  Patient was admitted with working diagnosis of acute alcoholic hepatitis.  54 year old male (nonbinary male, on hormonal therapy) who presents to the hospital with abdominal pain and vomiting.  He does have history of alcoholic pancreatitis and alcohol abuse.  On his initial physical examination blood pressure 103/70 heart rate 60, respiratory rate oxygen saturation 95%, his lungs are clear to auscultation bilaterally, heart S1 and S2 present, his abdomen was soft and non tender, no lower extremity edema.  Sodium 133, potassium 3.6, chloride 98, bicarb 26, glucose 150, BUN 16, creatinine 0.8, lipase 38, Lundberg count 6.8, 10.1, hematocrit 43.4, platelets 275. INR 1,7.  COVID-19 negative.  Alcohol 209.   05/18 ER visit to [facility] with acute alcohol intoxication. [facility].  05/09 Admission for acute alcoholic pancreatitis, left against medical advice.   Assessment & Plan:   Principal Problem:   Alcoholic hepatitis Active Problems:   Alcohol use disorder, severe, dependence (HCC)   Alcoholic pancreatitis   Major depressive disorder, recurrent episode, severe (HCC)   GERD (gastroesophageal reflux disease)   1. Acute alcoholic pancreatitis and hepatitis. Patient continue to have abdominal pain, he is tolerating clears, no nausea or vomiting. He has worsening LFT with AST 1,860, ALT 858, T Bil 1,5. INR is 1.7 to 1,5. PT 17,8. Hepatitis discriminant function score is 33.   Will continue supportive medical care with IV fluids, bowel rest with clear liquids, IV antiacids and as needed antiemetics and analgesics. Due to elevated hepatitis discriminant function above 32, will start patient on prednisone 40 mg daily.   Patient continue at high risk for worsening hepatitis, will transfer to telemetry.   2. Acute  alcohol intoxication. Patient is awake and alert, no tremors or agitation. Will continue CIWA protocol with lorazepam to prevent severe alcohol withdrawal symptoms. Will hold on atenolol for now.   3. GERD. Continue pantoprazole and sucralfate, clear liquid diet for now.   4, Hyponatremia and hypokalemia. Stable renal function with serum cr at 0,81, no acidosis with serum bicarbonate at 26. K up to 4,2 and Na at 135.   Will change IV fluids to balanced electrolyte solutions to 75 ml per H or LR.   5. HTN. Continue with atenolol for blood pressure control, will hold on spironolactone for now.   6. Anxiety and depression. Will resume with hydroxyzine.   Status is: Inpatient  Remains inpatient appropriate because:IV treatments appropriate due to intensity of illness or inability to take PO   Dispo: The patient is from: Home              Anticipated d/c is to: Home              Anticipated d/c date is: 3 days              Patient currently is not medically stable to d/c.  DVT prophylaxis: Enoxaparin   Code Status:   full  Family Communication:  No family at the bedside     Subjective: Patient continue to have abdominal pain, moderate in intensity, no nausea or vomiting, no dyspnea or chest pain. Tolerating clears.   Objective: Vitals:   11/03/19 0300 11/03/19 0400 11/03/19 0500 11/03/19 0600  BP: 140/86 (!) 129/92 131/90 122/86  Pulse: 70 71 76   Resp: 16 19 (!) 22 20  Temp:  98.8 F (37.1 C)    TempSrc:  Oral    SpO2: 92% 92% 95%   Weight:      Height:        Intake/Output Summary (Last 24 hours) at 11/03/2019 0818 Last data filed at 11/03/2019 0400 Gross per 24 hour  Intake 960 ml  Output 1200 ml  Net -240 ml   Filed Weights   11/02/19 1845  Weight: 80.8 kg    Examination:   General: Not in pain or dyspnea, deconditioned  Neurology: Awake and alert, non focal  E ENT: no pallor, no icterus, oral mucosa moist Cardiovascular: No JVD. S1-S2 present, rhythmic, no  gallops, rubs, or murmurs. No lower extremity edema. Pulmonary: positive breath sounds bilaterally, adequate air movement, no wheezing, rhonchi or rales. Gastrointestinal. Abdomen soft with no organomegaly, non tender to superficial palpation, no rebound or guarding Skin. No rashes Musculoskeletal: no joint deformities     Data Reviewed: I have personally reviewed following labs and imaging studies  CBC: Recent Labs  Lab 10/31/19 1345 11/02/19 1423 11/03/19 0203  WBC 7.1 6.8 5.6  NEUTROABS 3.6  --   --   HGB 14.5 15.1 13.5  HCT 41.8 43.4 39.5  MCV 98.4 100.0 99.5  PLT 258 275 224   Basic Metabolic Panel: Recent Labs  Lab 10/31/19 1345 11/02/19 1423 11/03/19 0203  NA 134* 133* 135  K 3.6 3.9 4.2  CL 93* 98 103  CO2 29 26 26   GLUCOSE 146* 150* 107*  BUN 15 16 13   CREATININE 0.79 0.88 0.81  CALCIUM 9.7 8.3* 8.1*  MG  --   --  2.1  PHOS  --   --  2.2*   GFR: Estimated Creatinine Clearance (by C-G formula based on SCr of 0.81 mg/dL) Male: 91.4 mL/min Male: 107.9 mL/min Liver Function Tests: Recent Labs  Lab 10/31/19 1345 11/02/19 1423 11/03/19 0203  AST 33 730* 1,860*  ALT 28 337* 858*  ALKPHOS 76 81 77  BILITOT 0.9 1.2 1.5*  PROT 6.6 6.7 5.8*  ALBUMIN 4.0 4.3 3.6   Recent Labs  Lab 10/31/19 1345 11/02/19 1423  LIPASE 36 30   No results for input(s): AMMONIA in the last 168 hours. Coagulation Profile: Recent Labs  Lab 11/02/19 1511 11/03/19 0203  INR 1.7* 1.5*   Cardiac Enzymes: No results for input(s): CKTOTAL, CKMB, CKMBINDEX, TROPONINI in the last 168 hours. BNP (last 3 results) No results for input(s): PROBNP in the last 8760 hours. HbA1C: No results for input(s): HGBA1C in the last 72 hours. CBG: No results for input(s): GLUCAP in the last 168 hours. Lipid Profile: No results for input(s): CHOL, HDL, LDLCALC, TRIG, CHOLHDL, LDLDIRECT in the last 72 hours. Thyroid Function Tests: No results for input(s): TSH, T4TOTAL, FREET4, T3FREE,  THYROIDAB in the last 72 hours. Anemia Panel: Recent Labs    11/02/19 1908  VITAMINB12 1,278*  FOLATE 53.7      Radiology Studies: I have reviewed all of the imaging during this hospital visit personally     Scheduled Meds: . atenolol  25 mg Oral Daily  . Chlorhexidine Gluconate Cloth  6 each Topical Daily  . enoxaparin (LOVENOX) injection  40 mg Subcutaneous Q24H  . folic acid  1 mg Oral Daily  . multivitamin with minerals  1 tablet Oral Daily  . nicotine  14 mg Transdermal Daily  . pantoprazole  40 mg Oral Daily  . thiamine  100 mg Oral Daily   Or  . thiamine  100  mg Intravenous Daily   Continuous Infusions: . sodium chloride       LOS: 1 day        Natayah Warmack Annett Gula, MD

## 2019-11-04 LAB — BASIC METABOLIC PANEL
Anion gap: 9 (ref 5–15)
BUN: <5 mg/dL — ABNORMAL LOW (ref 6–20)
CO2: 24 mmol/L (ref 22–32)
Calcium: 8.7 mg/dL — ABNORMAL LOW (ref 8.9–10.3)
Chloride: 104 mmol/L (ref 98–111)
Creatinine, Ser: 0.71 mg/dL (ref 0.61–1.24)
GFR calc Af Amer: >60 mL/min (ref >60)
GFR calc non Af Amer: >60 mL/min (ref >60)
Glucose, Bld: 121 mg/dL — ABNORMAL HIGH (ref 70–99)
Potassium: 4 mmol/L (ref 3.5–5.1)
Sodium: 137 mmol/L (ref 135–145)

## 2019-11-04 LAB — HEPATIC FUNCTION PANEL
ALT: 648 U/L — ABNORMAL HIGH (ref 0–44)
AST: 775 U/L — ABNORMAL HIGH (ref 15–41)
Albumin: 3.7 g/dL (ref 3.5–5.0)
Alkaline Phosphatase: 98 U/L (ref 38–126)
Bilirubin, Direct: 0.6 mg/dL — ABNORMAL HIGH (ref 0.0–0.2)
Indirect Bilirubin: 1.5 mg/dL — ABNORMAL HIGH (ref 0.3–0.9)
Total Bilirubin: 2.1 mg/dL — ABNORMAL HIGH (ref 0.3–1.2)
Total Protein: 6.2 g/dL — ABNORMAL LOW (ref 6.5–8.1)

## 2019-11-04 LAB — PROTIME-INR
INR: 1.2 (ref 0.8–1.2)
Prothrombin Time: 14.4 seconds (ref 11.4–15.2)

## 2019-11-04 MED ORDER — PANTOPRAZOLE SODIUM 40 MG PO TBEC
40.0000 mg | DELAYED_RELEASE_TABLET | Freq: Every day | ORAL | Status: DC
  Administered 2019-11-04 – 2019-11-05 (×2): 40 mg via ORAL
  Filled 2019-11-04 (×2): qty 1

## 2019-11-04 NOTE — Progress Notes (Signed)
PROGRESS NOTE    Nicolas [name]  NFA:[id number] DOB: [birth year] DOA: 11/02/2019 PCP: Clayborn Heron, MD    Brief Narrative:  Patient was admitted with working diagnosis of acute alcoholic hepatitis.  54 year old male (nonbinary male, on hormonal therapy) who presents to the hospital with abdominal pain and vomiting.  He does have history of alcoholic pancreatitis and alcohol abuse.  On his initial physical examination blood pressure 103/70 heart rate 60, respiratory rate oxygen saturation 95%, his lungs are clear to auscultation bilaterally, heart S1 and S2 present, his abdomen was soft and non tender, no lower extremity edema.  Sodium 133, potassium 3.6, chloride 98, bicarb 26, glucose 150, BUN 16, creatinine 0.8, lipase 38, Sealey count 6.8, 10.1, hematocrit 43.4, platelets 275. INR 1,7.  COVID-19 negative.  Alcohol 209.   05/18 ER visit to [facility] with acute alcohol intoxication. 05/14 ER visit to [facility] with acute alcohol intoxication.  05/09 Admission for acute alcoholic pancreatitis, left against medical advice.   Patient has been placed on supportive medical therapy including bowel rest, as needed analgesics and antiemetics along with IV fluids and systemic steroids.    Assessment & Plan:   Principal Problem:   Alcoholic hepatitis Active Problems:   Alcohol use disorder, severe, dependence (HCC)   Alcoholic pancreatitis   Major depressive disorder, recurrent episode, severe (HCC)   GERD (gastroesophageal reflux disease)   1. Acute alcoholic pancreatitis and hepatitis. Hepatitis discriminant function score is 33.   Improved abdominal pain, but continue to have nausea with no vomiting. LFT this am with AST 775, ALT 648, T Bil 1,5. INR is 1,2.No encephalopathy.   Continue supportive medical care with IV fluids with balanced electrolyte solution LR at 50 ml per H, advance diet to soft. Continue with antiacids, prn antiemetics and analgesics. Continue prednisone  40 mg daily. Follow up liver function panel and INR in am.   Patient continue at high risk for worsening hepatitis.  2. Acute alcohol intoxication. Mild tremors but no agitation. Continue CIWA protocol with lorazepam.   Continue with atenolol.   3. GERD. On pantoprazole and sucralfate, advance to soft diet today.    4, Hyponatremia and hypokalemia. Stable renal function with serum cr at 0,71 with K at 4,0 and serum bicarbonate at 24  Will continue gentle hydration with LR at 50 ml per H.   5. HTN. Holding spironolactone, continue atenolol, blood pressure today 138/90 mmHg.    6. Anxiety and depression. continue hydroxyzine.    Status is: Inpatient  Remains inpatient appropriate because:IV treatments appropriate due to intensity of illness or inability to take PO   Dispo: The patient is from: Home              Anticipated d/c is to: Home              Anticipated d/c date is: 1 day              Patient currently is not medically stable to d/c.    DVT prophylaxis: Enoxaparin   Code Status:   full  Family Communication:  No family at the bedside      Subjective: Patient is feeling better but not yet back to baseline, has been tolerating well clears, no nausea or vomiting, no chest pain, dyspnea or abdominal pain.   Objective: Vitals:   11/03/19 1554 11/03/19 1755 11/04/19 0130 11/04/19 0557  BP: 136/85 137/86 117/81 138/90  Pulse: (!) 59 71 60 (!) 54  Resp: 14 18  18 18  Temp: 98.4 F (36.9 C) 98.9 F (37.2 C) 97.9 F (36.6 C) 98.3 F (36.8 C)  TempSrc: Oral Oral Oral Oral  SpO2: 95% 99% 94% 95%  Weight:      Height:        Intake/Output Summary (Last 24 hours) at 11/04/2019 0932 Last data filed at 11/04/2019 0600 Gross per 24 hour  Intake 2295 ml  Output 600 ml  Net 1695 ml   Filed Weights   11/02/19 1845  Weight: 80.8 kg    Examination:   General: Not in pain or dyspnea, deconditioned  Neurology: Awake and alert, non focal. Mild resting  tremors at hands E ENT: no pallor, no icterus, oral mucosa moist Cardiovascular: No JVD. S1-S2 present, rhythmic, no gallops, rubs, or murmurs. No lower extremity edema. Pulmonary: vesicular breath sounds bilaterally, adequate air movement, no wheezing, rhonchi or rales. Gastrointestinal. Abdomen soft with, no organomegaly, non tender to superficial palpation, no rebound or guarding Skin. No rashes Musculoskeletal: no joint deformities     Data Reviewed: I have personally reviewed following labs and imaging studies  CBC: Recent Labs  Lab 10/31/19 1345 11/02/19 1423 11/03/19 0203  WBC 7.1 6.8 5.6  NEUTROABS 3.6  --   --   HGB 14.5 15.1 13.5  HCT 41.8 43.4 39.5  MCV 98.4 100.0 99.5  PLT 258 275 224   Basic Metabolic Panel: Recent Labs  Lab 10/31/19 1345 11/02/19 1423 11/03/19 0203 11/04/19 0439  NA 134* 133* 135 137  K 3.6 3.9 4.2 4.0  CL 93* 98 103 104  CO2 29 26 26 24   GLUCOSE 146* 150* 107* 121*  BUN 15 16 13  <5*  CREATININE 0.79 0.88 0.81 0.71  CALCIUM 9.7 8.3* 8.1* 8.7*  MG  --   --  2.1  --   PHOS  --   --  2.2*  --    GFR: Estimated Creatinine Clearance (by C-G formula based on SCr of 0.71 mg/dL) Male: 40.9 mL/min Male: 109.2 mL/min Liver Function Tests: Recent Labs  Lab 10/31/19 1345 11/02/19 1423 11/03/19 0203  AST 33 730* 1,860*  ALT 28 337* 858*  ALKPHOS 76 81 77  BILITOT 0.9 1.2 1.5*  PROT 6.6 6.7 5.8*  ALBUMIN 4.0 4.3 3.6   Recent Labs  Lab 10/31/19 1345 11/02/19 1423  LIPASE 36 30   No results for input(s): AMMONIA in the last 168 hours. Coagulation Profile: Recent Labs  Lab 11/02/19 1511 11/03/19 0203 11/04/19 0439  INR 1.7* 1.5* 1.2   Cardiac Enzymes: No results for input(s): CKTOTAL, CKMB, CKMBINDEX, TROPONINI in the last 168 hours. BNP (last 3 results) No results for input(s): PROBNP in the last 8760 hours. HbA1C: No results for input(s): HGBA1C in the last 72 hours. CBG: Recent Labs  Lab 11/03/19 1210  GLUCAP  125*   Lipid Profile: No results for input(s): CHOL, HDL, LDLCALC, TRIG, CHOLHDL, LDLDIRECT in the last 72 hours. Thyroid Function Tests: No results for input(s): TSH, T4TOTAL, FREET4, T3FREE, THYROIDAB in the last 72 hours. Anemia Panel: Recent Labs    11/02/19 1908  VITAMINB12 1,278*  FOLATE 53.7      Radiology Studies: I have reviewed all of the imaging during this hospital visit personally     Scheduled Meds: . atenolol  25 mg Oral Daily  . Chlorhexidine Gluconate Cloth  6 each Topical Daily  . enoxaparin (LOVENOX) injection  40 mg Subcutaneous Q24H  . folic acid  1 mg Oral Daily  . multivitamin with  minerals  1 tablet Oral Daily  . nicotine  14 mg Transdermal Daily  . pantoprazole (PROTONIX) IV  40 mg Intravenous Q24H  . predniSONE  40 mg Oral Q breakfast  . sucralfate  1 g Oral TID WC & HS  . thiamine  100 mg Oral Daily   Continuous Infusions: . lactated ringers 75 mL/hr at 11/04/19 0034     LOS: 2 days        Elcie Pelster Annett Gula, MD

## 2019-11-04 NOTE — TOC Initial Note (Signed)
Transition of Care [facility]) - Initial/Assessment Note    Patient Details  Name: Nicolas [name] MRN: [id number] Date of Birth: 01-May-1966  Transition of Care (TOC) CM/SW Contact:    Armanda Heritage, RN Phone Number: 11/04/2019, 4:34 PM  Clinical Narrative:     CM spoke with patient at bedside regarding ETOH use.  Patient reports he has previously attempted to quit and has gone through residential and intensive outpatient programs.  Patient relocated to the [facility] area and is in the process of moving into his new apartment and feels that this is a good time for him to explore treatment options again.  Patient provided with SA resources list but does report that he has tried calling residential treatment centers and they have all been out of network with his insurance.  Patient reports that he plans to call his insurance carrier and obtain a list of residential treatment facilities that are in network.  No further CM needs identified at this time.  Expected Discharge Plan: Home/Self Care Barriers to Discharge: Continued Medical Work up   Patient Goals and CMS Choice Patient states their goals for this hospitalization and ongoing recovery are:: to get better and go home      Expected Discharge Plan and Services Expected Discharge Plan: Home/Self Care   Discharge Planning Services: CM Consult   Living arrangements for the past 2 months: Apartment                 DME Arranged: N/A DME Agency: NA       HH Arranged: NA HH Agency: NA        Prior Living Arrangements/Services Living arrangements for the past 2 months: Apartment Lives with:: Self Patient language and need for interpreter reviewed:: Yes Do you feel safe going back to the place where you live?: Yes      Need for Family Participation in Patient Care: No (Comment) Care giver support system in place?: No (comment)   Criminal Activity/Legal Involvement Pertinent to Current Situation/Hospitalization: No - Comment  as needed  Activities of Daily Living Home Assistive Devices/Equipment: None ADL Screening (condition at time of admission) Patient's cognitive ability adequate to safely complete daily activities?: Yes Is the patient deaf or have difficulty hearing?: No Does the patient have difficulty seeing, even when wearing glasses/contacts?: No Does the patient have difficulty concentrating, remembering, or making decisions?: No Patient able to express need for assistance with ADLs?: Yes Does the patient have difficulty dressing or bathing?: No Independently performs ADLs?: Yes (appropriate for developmental age) Does the patient have difficulty walking or climbing stairs?: No Weakness of Legs: None Weakness of Arms/Hands: None  Permission Sought/Granted                  Emotional Assessment Appearance:: Appears stated age Attitude/Demeanor/Rapport: Engaged Affect (typically observed): Accepting Orientation: : Oriented to Self, Oriented to Place, Oriented to  Time, Oriented to Situation   Psych Involvement: No (comment)  Admission diagnosis:  Alcoholic hepatitis [K70.10] Alcoholic hepatitis without ascites [K70.10] Patient Active Problem List   Diagnosis Date Noted  . GERD (gastroesophageal reflux disease) 11/03/2019  . Alcoholic hepatitis 11/02/2019  . Major depressive disorder, recurrent episode, severe (HCC) 10/21/2019  . 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 Acute pancreatitis 10/20/2019  . Alcohol use disorder, severe, dependence (HCC) 10/16/2019   PCP:  Clayborn Heron, MD Pharmacy:   [id number] [place], [facility] - [phone] [place] [address] Phone: [phone] Fax: [phone]  Social Determinants of Health (SDOH) Interventions    Readmission Risk Interventions Readmission Risk Prevention Plan 11/04/2019  Transportation Screening Complete  HRI or [place] Complete  Social Work Consult for [place]  Planning/Counseling Complete  Palliative Care Screening Not Applicable  Medication Review Press photographer) Complete

## 2019-11-04 NOTE — Plan of Care (Signed)
Plan of care reviewed and discussed with the patient. 

## 2019-11-05 LAB — COMPREHENSIVE METABOLIC PANEL
ALT: 457 U/L — ABNORMAL HIGH (ref 0–44)
AST: 343 U/L — ABNORMAL HIGH (ref 15–41)
Albumin: 3.9 g/dL (ref 3.5–5.0)
Alkaline Phosphatase: 108 U/L (ref 38–126)
Anion gap: 11 (ref 5–15)
BUN: 9 mg/dL (ref 6–20)
CO2: 24 mmol/L (ref 22–32)
Calcium: 9.1 mg/dL (ref 8.9–10.3)
Chloride: 101 mmol/L (ref 98–111)
Creatinine, Ser: 0.97 mg/dL (ref 0.61–1.24)
GFR calc Af Amer: >60 mL/min (ref >60)
GFR calc non Af Amer: >60 mL/min (ref >60)
Glucose, Bld: 130 mg/dL — ABNORMAL HIGH (ref 70–99)
Potassium: 3.7 mmol/L (ref 3.5–5.1)
Sodium: 136 mmol/L (ref 135–145)
Total Bilirubin: 1.1 mg/dL (ref 0.3–1.2)
Total Protein: 6.4 g/dL — ABNORMAL LOW (ref 6.5–8.1)

## 2019-11-05 LAB — PROTIME-INR
INR: 1 (ref 0.8–1.2)
Prothrombin Time: 12.9 seconds (ref 11.4–15.2)

## 2019-11-05 NOTE — Discharge Summary (Signed)
Physician Discharge Summary  Treasure Ingrum PRX:[id number] DOB: [birth year] DOA: 11/02/2019  PCP: Clayborn Heron, MD  Admit date: 11/02/2019 Discharge date: 11/05/2019  Admitted From: Home  Disposition:  Home   Recommendations for Outpatient Follow-up and new medication changes:  1. Follow up with Dr. Barbaraann Barthel in 7 days.  2. Patient was advised to avoid alcohol.  3. Patient has been non compliant with follow up, considering his significant clinical improvement will hold on systemic steroids.   Home Health: no  Equipment/Devices: no    Discharge Condition: stable  CODE STATUS: full  Diet recommendation: soft and advance as tolerated.   Brief/Interim Summary: Patient was admitted with working diagnosis of acute alcoholic hepatitis.  54 year old male(nonbinary male, on hormonal therapy)who presents to the hospital with abdominal pain and vomiting. He does have history of alcoholic pancreatitis and alcohol abuse. On his initial physical examination blood pressure 103/70 heart rate 60, respiratory rate oxygen saturation 95%,his lungs were clear to auscultation bilaterally, heart S1and S2 present, his abdomen was softand non tender, no lower extremity edema. Sodium 133, potassium 3.6,chloride 98, bicarb 26, glucose 150,BUN 16, creatinine 0.8, lipase 38,Kiper count 6.8, Hgb 10.1, hematocrit 43.4, platelets 275. INR 1,7.COVID-19 negative.Alcohol 209.   05/18 ER visit to [facility] with acute alcohol intoxication. 05/14 ER visit to [facility] with acute alcohol intoxication.  05/09 Admission for acute alcoholic pancreatitis, left against medical advice.  Patient has been placed on supportive medical therapy including bowel rest, as needed analgesics and antiemetics along with IV fluids and systemic steroids.  1.  Acute alcoholic hepatitis and pancreatitis.  Patient was admitted to the medical ward, he was placed on supportive medical therapy.  His hepatitis discriminant  function score was 33 and he was placed on oral prednisone.  Patient symptoms, of abdominal pain, nausea and vomiting improved.  His liver enzymes improved, at discharge AST 343, ALT 457 and  bilirubin 1.5.  Patient has been noncompliant multiple ED evaluations.  Considering his remarkable improvement in his symptoms and biomarkers, considering risk vs benefit will hold on systemic steroids. Patient will need close outpatient.  2. Acute alcohol intoxication.  Patient had frequent neuro checks, he was placed on alcohol withdrawal protocol, CIWA/lorazepam with good toleration.  At the time of discharge he has no signs of withdrawals, he has been advised complete cessation.  3.  GERD.  Patient will continue pantoprazole and sucralfate.  4.  Hyponatremia and hypokalemia.  His electrolytes were corrected, his kidney function remained stable.  At discharge his sodium was 136, potassium 3.7 chloride 101, bicarb 24, glucose 130, BUN 9, creatinine 0.97.  5. HTN. Patient will resume spironolactone and will continue atenolol at discharge.   6. Anxiety/ depression. Continue hydroxyzine.   Discharge Diagnoses:  Principal Problem:   Alcoholic hepatitis Active Problems:   Alcohol use disorder, severe, dependence (HCC)   Alcoholic pancreatitis   Major depressive disorder, recurrent episode, severe (HCC)   GERD (gastroesophageal reflux disease)    Discharge Instructions   Allergies as of 11/05/2019      Reactions   Caffeine Palpitations      Medication List    TAKE these medications   atenolol 25 MG tablet Commonly known as: TENORMIN Take 25 mg by mouth daily.   bismuth subsalicylate 262 MG/15ML suspension Commonly known as: PEPTO BISMOL Take 30 mLs by mouth every 6 (six) hours as needed for diarrhea or loose stools.   estradiol 0.1 mg/24hr patch Commonly known as: CLIMARA - Dosed in mg/24 hr  Place 0.1 mg onto the skin once a week.   famotidine 20 MG tablet Commonly known as:  PEPCID Take 1 tablet (20 mg total) by mouth 2 (two) times daily for 5 days.   hydrOXYzine 25 MG tablet Commonly known as: ATARAX/VISTARIL Take 25 mg by mouth at bedtime as needed for anxiety (sleep).   MELATONIN PO Take 2 tablets by mouth at bedtime.   multivitamin with minerals Tabs tablet Take 1 tablet by mouth daily.   ondansetron 4 MG disintegrating tablet Commonly known as: Zofran ODT Take 1 tablet (4 mg total) by mouth every 8 (eight) hours as needed for nausea or vomiting.   pantoprazole 20 MG tablet Commonly known as: PROTONIX Take 1 tablet (20 mg total) by mouth daily.   PROBIOTIC PO Take 1 capsule by mouth daily.   spironolactone 50 MG tablet Commonly known as: ALDACTONE Take 50 mg by mouth 2 (two) times daily.   sucralfate 1 g tablet Commonly known as: Carafate Take 1 tablet (1 g total) by mouth 4 (four) times daily -  with meals and at bedtime for 14 days.       Allergies  Allergen Reactions  . Caffeine Palpitations       Procedures/Studies: CT ABDOMEN PELVIS W CONTRAST  Result Date: 10/20/2019 CLINICAL DATA:  Severe epigastric pain. EXAM: CT ABDOMEN AND PELVIS WITH CONTRAST TECHNIQUE: Multidetector CT imaging of the abdomen and pelvis was performed using the standard protocol following bolus administration of intravenous contrast. CONTRAST:  OMNIPAQUE IOHEXOL 300 MG/ML  SOLN COMPARISON:  None. FINDINGS: Lower chest: No acute abnormality. Hepatobiliary: No focal liver abnormality is seen. No gallstones, gallbladder wall thickening, or biliary dilatation. Pancreas: Pancreatic and peripancreatic edema in the head and uncinate process of the pancreas. The body and tail of the pancreas have normal appearance. 1.5 cm cystic structure within the head of the pancreas peripherally may represent a pseudocyst or cystic pancreatic neoplasm. Spleen: Normal in size without focal abnormality. Adrenals/Urinary Tract: Adrenal glands are unremarkable. Kidneys are normal,  without renal calculi, focal lesion, or hydronephrosis. Bladder is unremarkable. Stomach/Bowel: Normal appearance of the stomach. Inflammatory changes of the second and third portions of the duodenum, likely reactive. No evidence of small-bowel obstruction. Normal appearance of the colon and appendix. Vascular/Lymphatic: No significant vascular findings are present. No enlarged abdominal or pelvic lymph nodes. Shotty peripancreatic lymph nodes, not pathologic by CT criteria. Reproductive: Prostate is unremarkable. Other: No abdominal wall hernia or abnormality. No abdominopelvic ascites. Musculoskeletal: No acute or significant osseous findings. IMPRESSION: 1. Pancreatic and peripancreatic edema of the head and uncinate process of the pancreas. The findings are most consistent with acute pancreatitis. 2. 1.5 cm cystic structure within the head of the pancreas peripherally may represent a small pseudocyst or cystic pancreatic neoplasm. Re-evaluation upon resolution of the acute symptoms is recommended. 3. Inflammatory changes of the second and third portions of the duodenum, likely reactive. Electronically Signed   By: Ted Mcalpine M.D.   On: 10/20/2019 12:49     Subjective: Patient is feeling better, no nausea or vomiting, tolerating po well, no chest pain or dyspnea. Has been out of bed and ambulating with no difficulty.   Discharge Exam: Vitals:   11/05/19 0458 11/05/19 0855  BP: 111/86 114/78  Pulse: 86 73  Resp: 17   Temp: 98 F (36.7 C)   SpO2: 100%    Vitals:   11/04/19 1355 11/04/19 2051 11/05/19 0458 11/05/19 0855  BP: 110/79 122/73 111/86 114/78  Pulse:  66 67 86 73  Resp: 14  17   Temp: 97.7 F (36.5 C) 97.8 F (36.6 C) 98 F (36.7 C)   TempSrc: Oral Oral Oral   SpO2: 97% 94% 100%   Weight:      Height:        General: Not in pain or dyspnea Neurology: Awake and alert, non focal  E ENT: no pallor, no icterus, oral mucosa moist Cardiovascular: No JVD. S1-S2 present,  rhythmic, no gallops, rubs, or murmurs. No lower extremity edema. Pulmonary: positive breath sounds bilaterally, adequate air movement, no wheezing, rhonchi or rales. Gastrointestinal. Abdomen soft with no organomegaly, non tender, no rebound or guarding Skin. No rashes Musculoskeletal: no joint deformities   The results of significant diagnostics from this hospitalization (including imaging, microbiology, ancillary and laboratory) are listed below for reference.     Microbiology: Recent Results (from the past 240 hour(s))  SARS Coronavirus 2 by RT PCR (hospital order, performed in [facility] hospital lab) Nasopharyngeal Nasopharyngeal Swab     Status: None   Collection Time: 11/02/19  3:35 PM   Specimen: Nasopharyngeal Swab  Result Value Ref Range Status   SARS Coronavirus 2 NEGATIVE NEGATIVE Final    Comment: (NOTE) SARS-CoV-2 target nucleic acids are NOT DETECTED. The SARS-CoV-2 RNA is generally detectable in upper and lower respiratory specimens during the acute phase of infection. The lowest concentration of SARS-CoV-2 viral copies this assay can detect is 250 copies / mL. A negative result does not preclude SARS-CoV-2 infection and should not be used as the sole basis for treatment or other patient management decisions.  A negative result may occur with improper specimen collection / handling, submission of specimen other than nasopharyngeal swab, presence of viral mutation(s) within the areas targeted by this assay, and inadequate number of viral copies (<250 copies / mL). A negative result must be combined with clinical observations, patient history, and epidemiological information. Fact Sheet for Patients:   StrictlyIdeas.no Fact Sheet for Healthcare Providers: BankingDealers.co.za This test is not yet approved or cleared  by the [place] FDA and has been authorized for detection and/or diagnosis of SARS-CoV-2 by FDA  under an Emergency Use Authorization (EUA).  This EUA will remain in effect (meaning this test can be used) for the duration of the COVID-19 declaration under Section 564(b)(1) of the Act, 21 U.S.C. section 360bbb-3(b)(1), unless the authorization is terminated or revoked sooner. Performed at [facility], [place] [address] [place], [place] [address]      Labs: BNP (last 3 results) No results for input(s): BNP in the last 8760 hours. Basic Metabolic Panel: Recent Labs  Lab 10/31/19 1345 11/02/19 1423 11/03/19 0203 11/04/19 0439 11/05/19 0511  NA 134* 133* 135 137 136  K 3.6 3.9 4.2 4.0 3.7  CL 93* 98 103 104 101  CO2 29 26 26 24 24   GLUCOSE 146* 150* 107* 121* 130*  BUN 15 16 13  <5* 9  CREATININE 0.79 0.88 0.81 0.71 0.97  CALCIUM 9.7 8.3* 8.1* 8.7* 9.1  MG  --   --  2.1  --   --   PHOS  --   --  2.2*  --   --    Liver Function Tests: Recent Labs  Lab 10/31/19 1345 11/02/19 1423 11/03/19 0203 11/04/19 0439 11/05/19 0511  AST 33 730* 1,860* 775* 343*  ALT 28 337* 858* 648* 457*  ALKPHOS 76 81 77 98 108  BILITOT 0.9 1.2 1.5* 2.1* 1.1  PROT 6.6  6.7 5.8* 6.2* 6.4*  ALBUMIN 4.0 4.3 3.6 3.7 3.9   Recent Labs  Lab 10/31/19 1345 11/02/19 1423  LIPASE 36 30   No results for input(s): AMMONIA in the last 168 hours. CBC: Recent Labs  Lab 10/31/19 1345 11/02/19 1423 11/03/19 0203  WBC 7.1 6.8 5.6  NEUTROABS 3.6  --   --   HGB 14.5 15.1 13.5  HCT 41.8 43.4 39.5  MCV 98.4 100.0 99.5  PLT 258 275 224   Cardiac Enzymes: No results for input(s): CKTOTAL, CKMB, CKMBINDEX, TROPONINI in the last 168 hours. BNP: Invalid input(s): POCBNP CBG: Recent Labs  Lab 11/03/19 1210  GLUCAP 125*   D-Dimer No results for input(s): DDIMER in the last 72 hours. Hgb A1c No results for input(s): HGBA1C in the last 72 hours. Lipid Profile No results for input(s): CHOL, HDL, LDLCALC, TRIG, CHOLHDL, LDLDIRECT in the last 72 hours. Thyroid function  studies No results for input(s): TSH, T4TOTAL, T3FREE, THYROIDAB in the last 72 hours.  Invalid input(s): FREET3 Anemia work up Recent Labs    11/02/19 1908  VITAMINB12 1,278*  FOLATE 53.7   Urinalysis    Component Value Date/Time   COLORURINE COLORLESS (A) 10/15/2019 1845   APPEARANCEUR CLEAR 10/15/2019 1845   LABSPEC 1.002 (L) 10/15/2019 1845   PHURINE 7.0 10/15/2019 1845   GLUCOSEU NEGATIVE 10/15/2019 1845   HGBUR NEGATIVE 10/15/2019 1845   BILIRUBINUR NEGATIVE 10/15/2019 1845   KETONESUR NEGATIVE 10/15/2019 1845   PROTEINUR NEGATIVE 10/15/2019 1845   NITRITE NEGATIVE 10/15/2019 1845   LEUKOCYTESUR NEGATIVE 10/15/2019 1845   Sepsis Labs Invalid input(s): PROCALCITONIN,  WBC,  LACTICIDVEN Microbiology Recent Results (from the past 240 hour(s))  SARS Coronavirus 2 by RT PCR (hospital order, performed in [facility] hospital lab) Nasopharyngeal Nasopharyngeal Swab     Status: None   Collection Time: 11/02/19  3:35 PM   Specimen: Nasopharyngeal Swab  Result Value Ref Range Status   SARS Coronavirus 2 NEGATIVE NEGATIVE Final    Comment: (NOTE) SARS-CoV-2 target nucleic acids are NOT DETECTED. The SARS-CoV-2 RNA is generally detectable in upper and lower respiratory specimens during the acute phase of infection. The lowest concentration of SARS-CoV-2 viral copies this assay can detect is 250 copies / mL. A negative result does not preclude SARS-CoV-2 infection and should not be used as the sole basis for treatment or other patient management decisions.  A negative result may occur with improper specimen collection / handling, submission of specimen other than nasopharyngeal swab, presence of viral mutation(s) within the areas targeted by this assay, and inadequate number of viral copies (<250 copies / mL). A negative result must be combined with clinical observations, patient history, and epidemiological information. Fact Sheet for Patients:    BoilerBrush.com.cy Fact Sheet for Healthcare Providers: https://pope.com/ This test is not yet approved or cleared  by the [place] FDA and has been authorized for detection and/or diagnosis of SARS-CoV-2 by FDA under an Emergency Use Authorization (EUA).  This EUA will remain in effect (meaning this test can be used) for the duration of the COVID-19 declaration under Section 564(b)(1) of the Act, 21 U.S.C. section 360bbb-3(b)(1), unless the authorization is terminated or revoked sooner. Performed at [facility], [address], [place], [place] [address]      Time coordinating discharge: 45 minutes  SIGNED:   Coralie Keens, MD  [facility] 11/05/2019, 10:10 AM

## 2019-11-05 NOTE — Progress Notes (Signed)
Patient's IV removed.  Site WNL.  AVS reviewed with patient.  Verbalized understanding of discharge instructions, physician follow-up, medications.  Patient transported via wheelchair to entrance at discharge.  Patient stable at time of discharge.  Reports belongings in possession at discharge.

## 2019-11-12 ENCOUNTER — Ambulatory Visit (HOSPITAL_COMMUNITY): Payer: 59 | Admitting: Clinical

## 2019-11-12 ENCOUNTER — Other Ambulatory Visit: Payer: Self-pay

## 2019-11-14 ENCOUNTER — Telehealth (HOSPITAL_COMMUNITY): Payer: 59 | Admitting: Psychiatry

## 2019-11-14 ENCOUNTER — Other Ambulatory Visit: Payer: Self-pay

## 2019-11-17 ENCOUNTER — Emergency Department (HOSPITAL_COMMUNITY)
Admission: EM | Admit: 2019-11-17 | Discharge: 2019-11-17 | Disposition: A | Discharge status: Home / Self Care | Payer: 59 | Attending: Emergency Medicine | Admitting: Emergency Medicine

## 2019-11-17 ENCOUNTER — Other Ambulatory Visit: Payer: Self-pay

## 2019-11-17 ENCOUNTER — Encounter (HOSPITAL_COMMUNITY): Payer: Self-pay | Admitting: Emergency Medicine

## 2019-11-17 DIAGNOSIS — R1013 Epigastric pain: Secondary | ICD-10-CM | POA: Insufficient documentation

## 2019-11-17 DIAGNOSIS — Z79899 Other long term (current) drug therapy: Secondary | ICD-10-CM | POA: Insufficient documentation

## 2019-11-17 DIAGNOSIS — F1010 Alcohol abuse, uncomplicated: Secondary | ICD-10-CM | POA: Diagnosis present

## 2019-11-17 DIAGNOSIS — Y906 Blood alcohol level of 120-199 mg/100 ml: Secondary | ICD-10-CM | POA: Diagnosis not present

## 2019-11-17 DIAGNOSIS — K7010 Alcoholic hepatitis without ascites: Secondary | ICD-10-CM | POA: Diagnosis not present

## 2019-11-17 DIAGNOSIS — F17210 Nicotine dependence, cigarettes, uncomplicated: Secondary | ICD-10-CM | POA: Diagnosis not present

## 2019-11-17 LAB — CBC WITH DIFFERENTIAL/PLATELET
Abs Immature Granulocytes: 0.03 10*3/uL (ref 0.00–0.07)
Basophils Absolute: 0.1 10*3/uL (ref 0.0–0.1)
Basophils Relative: 1 %
Eosinophils Absolute: 0 10*3/uL (ref 0.0–0.5)
Eosinophils Relative: 1 %
HCT: 46 % (ref 39.0–52.0)
Hemoglobin: 16.3 g/dL (ref 13.0–17.0)
Immature Granulocytes: 0 %
Lymphocytes Relative: 39 %
Lymphs Abs: 2.7 10*3/uL (ref 0.7–4.0)
MCH: 35.1 pg — ABNORMAL HIGH (ref 26.0–34.0)
MCHC: 35.4 g/dL (ref 30.0–36.0)
MCV: 98.9 fL (ref 80.0–100.0)
Monocytes Absolute: 0.6 10*3/uL (ref 0.1–1.0)
Monocytes Relative: 9 %
Neutro Abs: 3.5 10*3/uL (ref 1.7–7.7)
Neutrophils Relative %: 50 %
Platelets: 325 10*3/uL (ref 150–400)
RBC: 4.65 MIL/uL (ref 4.22–5.81)
RDW: 12.5 % (ref 11.5–15.5)
WBC: 7 10*3/uL (ref 4.0–10.5)
nRBC: 0 % (ref 0.0–0.2)

## 2019-11-17 LAB — URINALYSIS, ROUTINE W REFLEX MICROSCOPIC
Bilirubin Urine: NEGATIVE
Glucose, UA: NEGATIVE mg/dL
Hgb urine dipstick: NEGATIVE
Ketones, ur: NEGATIVE mg/dL
Leukocytes,Ua: NEGATIVE
Nitrite: NEGATIVE
Protein, ur: NEGATIVE mg/dL
Specific Gravity, Urine: 1.017 (ref 1.005–1.030)
pH: 6 (ref 5.0–8.0)

## 2019-11-17 LAB — COMPREHENSIVE METABOLIC PANEL
ALT: 46 U/L — ABNORMAL HIGH (ref 0–44)
AST: 62 U/L — ABNORMAL HIGH (ref 15–41)
Albumin: 4.5 g/dL (ref 3.5–5.0)
Alkaline Phosphatase: 86 U/L (ref 38–126)
Anion gap: 15 (ref 5–15)
BUN: 13 mg/dL (ref 6–20)
CO2: 21 mmol/L — ABNORMAL LOW (ref 22–32)
Calcium: 9 mg/dL (ref 8.9–10.3)
Chloride: 93 mmol/L — ABNORMAL LOW (ref 98–111)
Creatinine, Ser: 0.79 mg/dL (ref 0.61–1.24)
GFR calc Af Amer: >60 mL/min (ref >60)
GFR calc non Af Amer: >60 mL/min (ref >60)
Glucose, Bld: 131 mg/dL — ABNORMAL HIGH (ref 70–99)
Potassium: 3.9 mmol/L (ref 3.5–5.1)
Sodium: 129 mmol/L — ABNORMAL LOW (ref 135–145)
Total Bilirubin: 0.8 mg/dL (ref 0.3–1.2)
Total Protein: 7.2 g/dL (ref 6.5–8.1)

## 2019-11-17 LAB — RAPID URINE DRUG SCREEN, HOSP PERFORMED
Amphetamines: NOT DETECTED
Barbiturates: NOT DETECTED
Benzodiazepines: POSITIVE — AB
Cocaine: NOT DETECTED
Opiates: POSITIVE — AB
Tetrahydrocannabinol: NOT DETECTED

## 2019-11-17 LAB — LIPASE, BLOOD: Lipase: 21 U/L (ref 11–51)

## 2019-11-17 LAB — ETHANOL: Alcohol, Ethyl (B): 181 mg/dL — ABNORMAL HIGH (ref <10)

## 2019-11-17 MED ORDER — THIAMINE HCL 100 MG/ML IJ SOLN
100.0000 mg | Freq: Every day | INTRAMUSCULAR | Status: DC
  Administered 2019-11-17: 100 mg via INTRAVENOUS
  Filled 2019-11-17: qty 2

## 2019-11-17 MED ORDER — ONDANSETRON 4 MG PO TBDP
4.0000 mg | ORAL_TABLET | Freq: Three times a day (TID) | ORAL | 0 refills | Status: DC | PRN
Start: 2019-11-17 — End: 2020-01-26

## 2019-11-17 MED ORDER — SODIUM CHLORIDE 0.9 % IV BOLUS
1000.0000 mL | Freq: Once | INTRAVENOUS | Status: AC
  Administered 2019-11-17: 1000 mL via INTRAVENOUS

## 2019-11-17 MED ORDER — LORAZEPAM 1 MG PO TABS: 0.0000 mg | ORAL_TABLET | Freq: Two times a day (BID) | ORAL | Status: DC

## 2019-11-17 MED ORDER — LORAZEPAM 2 MG/ML IJ SOLN
1.0000 mg | Freq: Once | INTRAMUSCULAR | Status: AC
  Administered 2019-11-17: 1 mg via INTRAVENOUS
  Filled 2019-11-17: qty 1

## 2019-11-17 MED ORDER — MORPHINE SULFATE (PF) 4 MG/ML IV SOLN
4.0000 mg | Freq: Once | INTRAVENOUS | Status: AC
  Administered 2019-11-17: 4 mg via INTRAVENOUS
  Filled 2019-11-17: qty 1

## 2019-11-17 MED ORDER — LORAZEPAM 1 MG PO TABS
0.0000 mg | ORAL_TABLET | Freq: Four times a day (QID) | ORAL | Status: DC
  Administered 2019-11-17: 1 mg via ORAL
  Filled 2019-11-17: qty 1

## 2019-11-17 MED ORDER — THIAMINE HCL 100 MG PO TABS: 100.0000 mg | ORAL_TABLET | Freq: Every day | ORAL | Status: DC

## 2019-11-17 MED ORDER — ONDANSETRON 4 MG PO TBDP
4.0000 mg | ORAL_TABLET | Freq: Once | ORAL | Status: AC
  Administered 2019-11-17: 4 mg via ORAL
  Filled 2019-11-17: qty 1

## 2019-11-17 MED ORDER — CHLORDIAZEPOXIDE HCL 25 MG PO CAPS
ORAL_CAPSULE | ORAL | 0 refills | Status: DC
Start: 2019-11-17 — End: 2020-01-26

## 2019-11-17 MED ORDER — LORAZEPAM 2 MG/ML IJ SOLN
0.0000 mg | Freq: Four times a day (QID) | INTRAMUSCULAR | Status: DC
  Administered 2019-11-17: 2 mg via INTRAVENOUS
  Filled 2019-11-17: qty 1

## 2019-11-17 MED ORDER — LORAZEPAM 2 MG/ML IJ SOLN: 0.0000 mg | Freq: Two times a day (BID) | INTRAMUSCULAR | Status: DC

## 2019-11-17 NOTE — Discharge Instructions (Addendum)
Substance Abuse Treatment Programs ° °Intensive Outpatient Programs °[facility]     °[address]      °[place], [place]                   °[phone]      ° °[facility] °[address] °[place], [place] °[phone] ° °[facility]     °(Inpatient and outpatient)     °[address]           °[phone]   ° °[facility] °[phone] (Suboxone and Methadone) ° °[address]      °[place], [place] [address]      °[phone]      ° °[address] °[place], [place] °852-3033 ° °[facility] (Outpatient/Inpatient, Chemical)    °(insurance only) [phone]      °       °[facility] (Groups & Residential) °[place], [place] °[phone] ° °   °[facility]     °[address]     °[place], [place]      °[phone]      ° °[facility] (for caregivers and family) °[address] °[place], [place] °336-299-4655 ° ° ° ° ° °Residential Treatment Programs °[facility]      °[address], [place], [place] [address]  °[phone]      ° °[facility] °[address] [place], [place] [address] °[phone] ° °[facility]        °[phone]      ° °[facility] °[phone] ° °[facility]             °[address]                                         °[place], [place]                                                °[phone] or [phone]                              ° °[facility] [place] °[address] °[place], [address] °[phone] ° °[facility]    °[address]      °[place], [place]     °[phone]      ° °[facility] °[address] °[place], [place] °336-285-9073 ° °[facility]   °[address]     °[place], [place] [address]     °[phone]      °Admissions: 8am-3pm M-F ° °[facility] °[address] °[place],  [place] °336-227-7417 ° °[facility]: Residential Program (90 Days)   °[place], [place]      °336-725-8389 or [phone]    ° °[facility] °[place], [place] °(Walk in Hours over the weekend or by referral) ° °[facility] °[address], [place],  [address] °[phone] ° °Crisis Mobile: [facility]:  1-877-626-1772 (for crisis response 24 hours a day) °[facility] Hotline:      1-800-256-2452 °Outpatient Psychiatry and Counseling ° °[facility]: Mobile Crisis   Management 24 hours:  1-877-626-1772 ° °[facility] sliding scale fee and walk in schedule: M-F 8am-12pm/1pm-3pm °[address]  °[place], [place] [address] °[phone] ° °[facility] °[address] °[place], [place] [address] °336-703-9650 ° °[facility] ([facility])- new patient walk-in appointments available Monday - Friday 8am -3pm.          °[address] °[place], [place] [address] °336-676-6840 or crisis line- 336-676-6905 ° °[facility]/ Intensive Outpatient Therapy Program °[address] °[place], [place] [address] °336-832-9804 ° °[facility]                  °Crisis Services      °[phone]      °[address]     °[place], [place] [address]                ° °[facility]   °[facility] °[phone] °[address] °[place], [place] [address] ° ° °[facility]          °[address],  °[place], [place] [address]       °[phone] ° °[facility] °[address] °[place], [place] [address] °336-292-1510 ° °[facility]    °[address]    °[place], [place] [address]     °[phone]      ° °Parish McKinney, MD     °[address]     °[place] [address]     °336-282-1251     °  °[facility] °[address] °[place] [address] ° °[facility]     °[address]     °[place], [place]      °336-542-2076      ° °[facility] °Shamsher Ahluwalia, MD °[address] °[place], [place] [address] °[phone] ° °[facility]     °[address]     °[place], [place] [address]     °336-274-1237      ° °[facility] °[address] °[place], [place] [address] °336-854-4450 °Resources for Temporary Residential Assistance/Crisis Centers ° °DAY CENTERS °[facility] °M-F 8am-3pm   °[address] [place], [place] [address]   336-332-0824 °Services include: laundry, barbering, support groups, case management, phone  & computer access, showers, AA/NA mtgs, mental health/substance abuse nurse, job skills class, disability information, VA assistance, spiritual classes, etc.  ° °HOMELESS SHELTERS ° °[facility]     °[facility]   °[address], [place]     °[phone]       °       °[facility] (women and children)       °[address] °[place], [place] [address] °336-275-0820 °[email or web address] for application and process °Application Required ° °[facility]   °[address]    °[place] [address]     °[phone]       °             °[facility] °[address] °,  [address] °336.273.5572 °336-235-0363(schedule application appt.) °Application Required ° °[facility] (women only)    °[address]     °[place],  [address]     °336-884-1039      °  Intake starts 6pm daily Need valid ID, SSC, & Police report [employer] [address] [place], [place] [phone] Application Required  [facility] (men only)     [address]      [place], [place]     [phone]       [facility] (Pregnant women only) [address] [place], [place] [phone]  [facility]      [address]      [place], [place] [address]     [phone]             [facility] [address] [place], [place] [phone] 90 day commitment/SA/Application process  [facility](men only)     [address]     [place], [place]     [phone]       Check-in at [facility] [address] [place], [place] [address] [phone] Men/Women/Women and Children must be there by 7 pm  [facility] [place], [place] [phone]                  Above are some of the outpatient resources. I have placed a referral for peer support. They will call you this week. I am sending you home with a librium taper to take to prevent alcohol withdrawal. Take as prescribed. DO NOT MIX WITH ALCOHOL. I am also sending you home with zofran as needed for nausea. Please follow-up with Dr. Barbaraann Barthel within the next few days. Return to the ER for new or worsening symptoms.

## 2019-11-17 NOTE — ED Notes (Signed)
Patient eating and drinking without difficulty and now resting.

## 2019-11-17 NOTE — ED Provider Notes (Signed)
[facility]-EMERGENCY DEPT Provider Note   CSN: [id number] Arrival date & time: 11/17/19  0555     History Chief Complaint  Patient presents with  . Alcohol Problem    Nicolas [name] is a 54 y.o. adult with a past medical history significant for alcohol abuse, GERD, hx of alcoholic hepatitis, hx of alcoholic pancreatitis, and depression who presents to the ED due to epigastric pain for the past 5 days. Patient notes pain has progressively gotten worse. Chart reviewed. He was recently admitted to the hospital from 5/22-5/25 due to acute alcoholic hepatitis. Prior to that he was admitted on 5/9 for alcoholic pancreatitis. Epigastric pain associated with nausea and decreased appetite. He notes he hasn't eaten anything in 3-4 days. Admits to tremors. Denies headache, hallucinations, palpitations, and diaphoresis. He continously notes his "pancreas is inflamed" and he needs ativan. He last drank 1 beer at 2am. Notes he typically drinks 7-8 "large beers" daily. Patient states that if he had a gun he would "blow his brains out to get rid of the pain"; however, he continously notes he does not want to die. Denies HI and auditory/visual hallucinations. Denies urinary symptoms and diarrhea. Denies fever and chills. No history of complicated withdrawal in the past.  History obtained from patient and past medical records. No interpreter used during encounter.      Past Medical History:  Diagnosis Date  . Alcohol abuse     Patient Active Problem List   Diagnosis Date Noted  . GERD (gastroesophageal reflux disease) 11/03/2019  . Alcoholic hepatitis 11/02/2019  . Major depressive disorder, recurrent episode, severe (HCC) 10/21/2019  . 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 Acute pancreatitis 10/20/2019  . Alcohol use disorder, severe, dependence (HCC) 10/16/2019    Past Surgical History:  Procedure Laterality Date  . NASAL SEPTUM SURGERY         No family history on  file.  Social History   Tobacco Use  . Smoking status: Current Every Day Smoker    Packs/day: 0.50    Types: Cigarettes  . Smokeless tobacco: Never Used  Substance Use Topics  . Alcohol use: Yes    Alcohol/week: 6.0 standard drinks    Types: 6 Cans of beer per week    Comment: daily use  . Drug use: Not Currently    Types: Cocaine    Comment: stopped 01/2019    Home Medications Prior to Admission medications   Medication Sig Start Date End Date Taking? Authorizing Provider  atenolol (TENORMIN) 25 MG tablet Take 25 mg by mouth daily. 08/31/19  Yes [provider]  bismuth subsalicylate (PEPTO BISMOL) 262 MG/15ML suspension Take 30 mLs by mouth every 6 (six) hours as needed for diarrhea or loose stools.   Yes [provider]  estradiol (CLIMARA - DOSED IN MG/24 HR) 0.1 mg/24hr patch Place 0.1 mg onto the skin once a week. 10/06/19  Yes [provider]  hydrOXYzine (ATARAX/VISTARIL) 25 MG tablet Take 25 mg by mouth at bedtime as needed for anxiety (sleep).  06/27/19  Yes [provider]  MELATONIN PO Take 2 tablets by mouth at bedtime.   Yes [provider]  Multiple Vitamin (MULTIVITAMIN WITH MINERALS) TABS tablet Take 1 tablet by mouth daily.   Yes [provider]  ondansetron (ZOFRAN ODT) 4 MG disintegrating tablet Take 1 tablet (4 mg total) by mouth every 8 (eight) hours as needed for nausea or vomiting. 10/31/19  Yes [name], Shawn C, PA-C  pantoprazole (PROTONIX) 20 MG  tablet Take 1 tablet (20 mg total) by mouth daily. 10/31/19 12/30/19 Yes [name], Shawn C, PA-C  Probiotic Product (PROBIOTIC PO) Take 1 capsule by mouth daily.   Yes [provider]  spironolactone (ALDACTONE) 50 MG tablet Take 50 mg by mouth 2 (two) times daily. 08/22/19  Yes [provider]  sucralfate (CARAFATE) 1 g tablet Take 1 tablet (1 g total) by mouth 4 (four) times daily -  with meals and at bedtime for 14 days. 10/31/19 11/17/19 Yes [name], Shawn C, PA-C   chlordiazePOXIDE (LIBRIUM) 25 MG capsule 50mg  PO TID x 1D, then 25-50mg  PO BID X 1D, then 25-50mg  PO QD X 1D 11/17/19   Kaesyn Johnston C, PA-C  famotidine (PEPCID) 20 MG tablet Take 1 tablet (20 mg total) by mouth 2 (two) times daily for 5 days. 10/31/19 11/05/19  [name], Shawn C, PA-C  ondansetron (ZOFRAN ODT) 4 MG disintegrating tablet Take 1 tablet (4 mg total) by mouth every 8 (eight) hours as needed for nausea or vomiting. 11/17/19   01/17/20, PA-C    Allergies    Caffeine  Review of Systems   Review of Systems  Constitutional: Negative for chills and fever.  Respiratory: Negative for shortness of breath.   Cardiovascular: Negative for chest pain.  Gastrointestinal: Positive for abdominal pain and nausea. Negative for diarrhea and vomiting.  Genitourinary: Negative for dysuria.  Psychiatric/Behavioral: Positive for suicidal ideas.  All other systems reviewed and are negative.   Physical Exam Updated Vital Signs BP (!) 131/99   Pulse 85   Temp 98.2 F (36.8 C) (Nicolas)   Resp 16   SpO2 97%   Physical Exam Vitals and nursing note reviewed.  Constitutional:      General: Nicolas [name] is not in acute distress.    Appearance: Nicolas [name] is not toxic-appearing.     Comments: Bilateral hand tremor.   HENT:     Head: Normocephalic.  Eyes:     Pupils: Pupils are equal, round, and reactive to light.  Cardiovascular:     Rate and Rhythm: Normal rate and regular rhythm.     Pulses: Normal pulses.     Heart sounds: Normal heart sounds. No murmur. No friction rub. No gallop.   Pulmonary:     Effort: Pulmonary effort is normal.     Breath sounds: Normal breath sounds.  Abdominal:     General: Abdomen is flat. Bowel sounds are normal. There is no distension.     Palpations: Abdomen is soft.     Tenderness: There is abdominal tenderness. There is no guarding or rebound.     Comments: Tenderness palpation epigastric region without rebound or guarding.  Musculoskeletal:      Cervical back: Neck supple.     Comments: Able to move all 4 extremities without difficulty.   Skin:    General: Skin is warm and dry.  Neurological:     General: No focal deficit present.     Mental Status: Nicolas [name] is alert.     Comments: Stumbling throughout the ED. Appears slightly intoxicated   Psychiatric:        Thought Content: Thought content is not paranoid. Thought content does not include homicidal ideation.     ED Results / Procedures / Treatments   Labs (all labs ordered are listed, but only abnormal results are displayed) Labs Reviewed  CBC WITH DIFFERENTIAL/PLATELET - Abnormal; Notable for the following components:      Result Value   MCH 35.1 (*)  All other components within normal limits  COMPREHENSIVE METABOLIC PANEL - Abnormal; Notable for the following components:   Sodium 129 (*)    Chloride 93 (*)    CO2 21 (*)    Glucose, Bld 131 (*)    AST 62 (*)    ALT 46 (*)    All other components within normal limits  ETHANOL - Abnormal; Notable for the following components:   Alcohol, Ethyl (B) 181 (*)    All other components within normal limits  RAPID URINE DRUG SCREEN, HOSP PERFORMED - Abnormal; Notable for the following components:   Opiates POSITIVE (*)    Benzodiazepines POSITIVE (*)    All other components within normal limits  LIPASE, BLOOD  URINALYSIS, ROUTINE W REFLEX MICROSCOPIC    EKG None  Radiology No results found.  Procedures Procedures (including critical care time)  Medications Ordered in ED Medications  LORazepam (ATIVAN) injection 0-4 mg ( Intravenous See Alternative 11/17/19 1418)    Or  LORazepam (ATIVAN) tablet 0-4 mg (1 mg Nicolas Given 11/17/19 1418)  LORazepam (ATIVAN) injection 0-4 mg (has no administration in time range)    Or  LORazepam (ATIVAN) tablet 0-4 mg (has no administration in time range)  thiamine tablet 100 mg ( Nicolas See Alternative 11/17/19 1128)    Or  thiamine (B-1) injection 100 mg (100 mg  Intravenous Given 11/17/19 1128)  morphine 4 MG/ML injection 4 mg (4 mg Intravenous Given 11/17/19 0937)  sodium chloride 0.9 % bolus 1,000 mL (0 mLs Intravenous Stopped 11/17/19 1217)  LORazepam (ATIVAN) injection 1 mg (1 mg Intravenous Given 11/17/19 0937)  sodium chloride 0.9 % bolus 1,000 mL (1,000 mLs Intravenous New Bag/Given 11/17/19 1358)  ondansetron (ZOFRAN-ODT) disintegrating tablet 4 mg (4 mg Nicolas Given 11/17/19 1453)    ED Course  I have reviewed the triage vital signs and the nursing notes.  Pertinent labs & imaging results that were available during my care of the patient were reviewed by me and considered in my medical decision making (see chart for details).  Clinical Course as of Nov 16 1512  Sun Nov 17, 2019  1104 Lipase: 21 [CA]    Clinical Course User Index [CA] Mannie Stabile, PA-C   MDM Rules/Calculators/A&P                     54 year old male presents to the ED due to epigastric pain for the past 5 days.  Patient has a history of alcohol abuse and history of alcoholic hepatitis and pancreatitis which he was just recently admitted for.  Upon arrival, patient afebrile, not tachycardic or hypoxic.  BP elevated at 141/101.  We will continue to monitor.  Patient in no acute distress and nontoxic-appearing.  Physical exam significant for epigastric tenderness without rebound or guarding.  Patient appears slightly intoxicated stumbling throughout the ED department. Will obtain routine labs, lipase, ethanol, UA, and UDS. Will give ativan, morphine, and IVFs for symptomatic relief. Discussed case with Dr. Stevie Kern who agrees with assessment and plan.   CBC reassuring with no leukocytosis and normal hemoglobin.  CMP significant for mild hyponatremia at 129 and transaminitis which has improved from a few days prior.  Lipase normal at 21.  Doubt pancreatitis. Ethanol level at 181. Patient able to tolerate po at bedside with no difficulties.   1:44 PM reassessed patient at bedside who  notes his symptoms improved after medication. He continuously asks to be admitted to the hospital and about his next dose  of ativan. Denies current SI, HI, and auditory/visual hallucinations. He admits to wanting to quit drinking.   UDS positive for benzos and opiates.  UA negative for signs of infection.  Patient able to tolerate p.o. at bedside without difficulty.  Patient clinically sober and able to ambulate here in the ED without difficulty.  Discharged patient with outpatient resources.  Peers support placed for patient. No current SI, HI, or hallucinations. No concern for active alcohol withdrawal at this time. Patient notes he wants to stop drinking. Will discharge with librium taper. Advised patient he cannot drink while on the medication and patient is agreeable to plan. Will also discharge patient with zofran as needed for nausea. Advised patient to follow-up with PCP early this week for further evaluation. Strict ED precautions discussed with patient. Patient states understanding and agrees to plan. Patient discharged home in no acute distress and stable vitals. Final Clinical Impression(s) / ED Diagnoses Final diagnoses:  Alcohol abuse  Epigastric pain    Rx / DC Orders ED Discharge Orders         Ordered    Consult to Peer Support    Provider:  (Not yet assigned)   11/17/19 1505    chlordiazePOXIDE (LIBRIUM) 25 MG capsule     11/17/19 1511    ondansetron (ZOFRAN ODT) 4 MG disintegrating tablet  Every 8 hours PRN     11/17/19 1514           Suzy Bouchard, PA-C 11/17/19 1517    Lucrezia Starch, MD 11/20/19 [phone]

## 2019-11-17 NOTE — ED Notes (Signed)
PT provided urine cup for sample.

## 2019-11-17 NOTE — ED Notes (Signed)
Patient reports "If I had a gun I would kill myself"

## 2019-11-17 NOTE — ED Triage Notes (Signed)
Patient presents stating he feels like he is withdrawing. Patient's last drink 3 hours ago. EMS found 8 bootlegger bottles and 3 24 oz beers on the counter. Patient is requesting an IV and Ativan. Patient endorses nausea.

## 2019-11-17 NOTE — ED Notes (Signed)
Pt having very violent outbursts at staff and registration on multiple accounts. He was made aware to sit back down. He states that people dont need to talk to him like that.

## 2019-11-18 ENCOUNTER — Other Ambulatory Visit: Payer: Self-pay

## 2019-11-18 ENCOUNTER — Encounter (HOSPITAL_BASED_OUTPATIENT_CLINIC_OR_DEPARTMENT_OTHER): Payer: Self-pay | Admitting: Emergency Medicine

## 2019-11-18 ENCOUNTER — Emergency Department (HOSPITAL_BASED_OUTPATIENT_CLINIC_OR_DEPARTMENT_OTHER)
Admission: EM | Admit: 2019-11-18 | Discharge: 2019-11-18 | Disposition: A | Discharge status: Home / Self Care | Payer: 59 | Attending: Emergency Medicine | Admitting: Emergency Medicine

## 2019-11-18 DIAGNOSIS — F1010 Alcohol abuse, uncomplicated: Secondary | ICD-10-CM | POA: Insufficient documentation

## 2019-11-18 DIAGNOSIS — F17210 Nicotine dependence, cigarettes, uncomplicated: Secondary | ICD-10-CM | POA: Diagnosis not present

## 2019-11-18 DIAGNOSIS — Z91018 Allergy to other foods: Secondary | ICD-10-CM | POA: Insufficient documentation

## 2019-11-18 NOTE — ED Notes (Signed)
Pt refused ID bracelet; pt sts he will call EMS to take him to [facility]; pt sts "[facility] sucks dick" and "y'all are trying to kill me."

## 2019-11-18 NOTE — ED Triage Notes (Signed)
Pt to ED via [facility] reporting he is in alcohol withdrawal; reports last drink 2 hours ago

## 2019-11-18 NOTE — ED Provider Notes (Signed)
[facility] EMERGENCY DEPARTMENT Provider Note   CSN: [id number] Arrival date & time: 11/18/19  1427     History Chief Complaint  Patient presents with  . Alcohol Problem    Nicolas [name] is a 54 y.o. adult.  54 yo M with a chief complaints of alcohol dependency.  Patient went to [facility] long yesterday and had requested detox and when they discussed with him that he needed to follow-up at the facility he walked home and continue to drink.  He is now here because he would like to be placed into a facility.  Last drank a couple hours ago.  He talked to a friend of his who told him that we could keep him for detox here.  The history is provided by the patient.  Alcohol Problem This is a new problem. The current episode started 2 days ago. The problem occurs constantly. The problem has not changed since onset.Pertinent negatives include no chest pain, no abdominal pain, no headaches and no shortness of breath. Nothing aggravates the symptoms. Nothing relieves the symptoms. Nicolas [name] has tried nothing for the symptoms. The treatment provided no relief.       Past Medical History:  Diagnosis Date  . Alcohol abuse     Patient Active Problem List   Diagnosis Date Noted  . GERD (gastroesophageal reflux disease) 11/03/2019  . Alcoholic hepatitis 11/02/2019  . Major depressive disorder, recurrent episode, severe (HCC) 10/21/2019  . 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 Acute pancreatitis 10/20/2019  . Alcohol use disorder, severe, dependence (HCC) 10/16/2019    Past Surgical History:  Procedure Laterality Date  . NASAL SEPTUM SURGERY         No family history on file.  Social History   Tobacco Use  . Smoking status: Current Every Day Smoker    Packs/day: 0.50    Types: Cigarettes  . Smokeless tobacco: Never Used  Substance Use Topics  . Alcohol use: Yes    Alcohol/week: 6.0 standard drinks    Types: 6 Cans of beer per week    Comment: daily use  . Drug  use: Not Currently    Types: Cocaine    Comment: stopped 01/2019    Home Medications Prior to Admission medications   Medication Sig Start Date End Date Taking? Authorizing Provider  atenolol (TENORMIN) 25 MG tablet Take 25 mg by mouth daily. 08/31/19   [provider]  bismuth subsalicylate (PEPTO BISMOL) 262 MG/15ML suspension Take 30 mLs by mouth every 6 (six) hours as needed for diarrhea or loose stools.    [provider]  chlordiazePOXIDE (LIBRIUM) 25 MG capsule 50mg  PO TID x 1D, then 25-50mg  PO BID X 1D, then 25-50mg  PO QD X 1D 11/17/19   Aberman, Caroline C, PA-C  estradiol (CLIMARA - DOSED IN MG/24 HR) 0.1 mg/24hr patch Place 0.1 mg onto the skin once a week. 10/06/19   [provider]  famotidine (PEPCID) 20 MG tablet Take 1 tablet (20 mg total) by mouth 2 (two) times daily for 5 days. 10/31/19 11/05/19  [name], Shawn C, PA-C  hydrOXYzine (ATARAX/VISTARIL) 25 MG tablet Take 25 mg by mouth at bedtime as needed for anxiety (sleep).  06/27/19   [provider]  MELATONIN PO Take 2 tablets by mouth at bedtime.    [provider]  Multiple Vitamin (MULTIVITAMIN WITH MINERALS) TABS tablet Take 1 tablet by mouth daily.    [provider]  ondansetron (ZOFRAN ODT) 4 MG disintegrating tablet Take 1 tablet (  4 mg total) by mouth every 8 (eight) hours as needed for nausea or vomiting. 10/31/19   [name], Shawn C, PA-C  ondansetron (ZOFRAN ODT) 4 MG disintegrating tablet Take 1 tablet (4 mg total) by mouth every 8 (eight) hours as needed for nausea or vomiting. 11/17/19   Suzy Bouchard, PA-C  pantoprazole (PROTONIX) 20 MG tablet Take 1 tablet (20 mg total) by mouth daily. 10/31/19 12/30/19  [name], Raquel Sarna C, PA-C  Probiotic Product (PROBIOTIC PO) Take 1 capsule by mouth daily.    [provider]  spironolactone (ALDACTONE) 50 MG tablet Take 50 mg by mouth 2 (two) times daily. 08/22/19   [provider]  sucralfate (CARAFATE) 1 g tablet Take 1  tablet (1 g total) by mouth 4 (four) times daily -  with meals and at bedtime for 14 days. 10/31/19 11/17/19  [name], Raquel Sarna C, PA-C    Allergies    Caffeine  Review of Systems   Review of Systems  Constitutional: Negative for chills and fever.  HENT: Negative for congestion and facial swelling.   Eyes: Negative for discharge and visual disturbance.  Respiratory: Negative for shortness of breath.   Cardiovascular: Negative for chest pain and palpitations.  Gastrointestinal: Negative for abdominal pain, diarrhea and vomiting.  Musculoskeletal: Negative for arthralgias and myalgias.  Skin: Negative for color change and rash.  Neurological: Negative for tremors, syncope and headaches.  Psychiatric/Behavioral: Negative for confusion and dysphoric mood.    Physical Exam Updated Vital Signs BP (!) 140/95 (BP Location: Right Arm)   Pulse 72   Temp 98.1 F (36.7 C) (Oral)   Resp 18   Ht 5\' 9"  (1.753 m)   Wt 81.7 kg   SpO2 98%   BMI 26.60 kg/m   Physical Exam Vitals and nursing note reviewed.  Constitutional:      Appearance: Nicolas [name] is well-developed.     Comments: Smells of alcohol on breath.  Able to ambulate independently and argue  HENT:     Head: Normocephalic and atraumatic.  Eyes:     Pupils: Pupils are equal, round, and reactive to light.  Neck:     Vascular: No JVD.  Cardiovascular:     Rate and Rhythm: Normal rate and regular rhythm.     Heart sounds: No murmur. No friction rub. No gallop.   Pulmonary:     Effort: No respiratory distress.     Breath sounds: No wheezing.  Abdominal:     General: There is no distension.     Tenderness: There is no abdominal tenderness. There is no guarding or rebound.  Musculoskeletal:        General: Normal range of motion.     Cervical back: Normal range of motion and neck supple.  Skin:    Coloration: Skin is not pale.     Findings: No rash.  Neurological:     Mental Status: Nicolas [name] is alert and oriented to person,  place, and time.  Psychiatric:        Behavior: Behavior normal.     ED Results / Procedures / Treatments   Labs (all labs ordered are listed, but only abnormal results are displayed) Labs Reviewed - No data to display  EKG None  Radiology No results found.  Procedures Procedures (including critical care time)  Medications Ordered in ED Medications - No data to display  ED Course  I have reviewed the triage vital signs and the nursing notes.  Pertinent labs & imaging results that  were available during my care of the patient were reviewed by me and considered in my medical decision making (see chart for details).    MDM Rules/Calculators/A&P                      54 yo M with a chief complaints of wanting alcohol rehabilitation.  I discussed with him that that is not typically offered at this facility.  I suggested that he follow-up as an outpatient as instructed yesterday.  The patient states that he is not in a state of mind be able to make it to a facility on his own.  He says this while he is able to ambulate freely and argue with me and the nursing staff.  He is not clinically in withdrawal and is able to make his own decisions.  I will have him follow-up with one of the outpatient centers.  He had a Librium taper that was ordered yesterday.  I reviewed his labs from yesterday.  Do not feel the need to be repeated.  2:48 PM:  I have discussed the diagnosis/risks/treatment options with the patient and believe the pt to be eligible for discharge home to follow-up with PCP. We also discussed returning to the ED immediately if new or worsening sx occur. We discussed the sx which are most concerning (e.g., sudden worsening pain, fever, inability to tolerate by mouth) that necessitate immediate return. Medications administered to the patient during their visit and any new prescriptions provided to the patient are listed below.  Medications given during this visit Medications - No  data to display   The patient appears reasonably screen and/or stabilized for discharge and I doubt any other medical condition or other [facility] requiring further screening, evaluation, or treatment in the ED at this time prior to discharge.   Final Clinical Impression(s) / ED Diagnoses Final diagnoses:  Alcohol abuse    Rx / DC Orders ED Discharge Orders    None       Melene Plan, DO 11/18/19 1448

## 2019-11-18 NOTE — Discharge Instructions (Signed)
Follow up with a rehab facility.  Return for seizures, confusion

## 2019-11-29 ENCOUNTER — Other Ambulatory Visit: Payer: Self-pay | Admitting: *Deleted

## 2019-11-29 ENCOUNTER — Encounter: Payer: Self-pay | Admitting: *Deleted

## 2019-11-29 NOTE — Patient Outreach (Signed)
[facility]) Care Management  11/29/2019  Nicolas [name] 05-Jun-1966 [id number]   Subjective: Telephone call to patient's home / mobile number, spoke with patient, and HIPAA verified.  Discussed [facility] Care Management [facility] [employer] referral follow up, patient voiced understanding, and is in agreement to follow up.   Patient states this RNCM may call patient Nicolas [name] ( which states is nickname).  States feeling better, completed alcohol detox recently, has resumed drinking daily in less amounts, and is planning to call [facility] on 11/30/2019 to request residential treatment.   JJ discussed the events leading to JJ's frequent hospitalizations and ED visits.  JJ relocated to [facility] from [facility] [place] due to covid shutdown within the last 6 months, currently unemployed Environmental manager, recently moved to a different apartment, and is hopeful new environment will aid overall alcohol abuse recovery. Nicolas [name] is attending Alcoholics Anonymous (AA) meetings, finds meetings very helpful, and attended last meeting this morning.   JJ states is walking 10 miles per day, eating healthy clean, and non processed foods.   JJ states drank 1 beer this morning, which is a less quantity than before detox admission.  JJ states providers strongly encouraging / requesting that patient be admitted [facility]), as soon as possible, JJ voices understand, and states will follow up.   JJ states has delayed calling [facility] because JJ wanted to try to do detox and alcohol abuse recovery on JJ's own.  JJ states aware residential services are needed at this time and will follow up.    JJ states no longer able to take Estradiol patch due to contraindications with patient's high cholesterol.   JJ states will advise this RNCM if [facility] referral needed in the future for Estradiol medication assistance or for medication education.   Patient in agreement to referral to [facility] Care  Management Social Worker for counseling related to alcohol abuse, assist / follow up on alcohol residential detox treatment facility placement, and  transgender counseling / Architectural technologist.   Patient states  is aware of signs/ symptoms to report, how to reach provider if needed after hours, when to go to ED, and / or call 911.  Patient states is able to manage self care and has assistance as needed. Patient voices understanding of medical diagnosis and treatment plan.  Discussed Advanced Directives, advised of [facility] Care Management  Advanced Directives packet resource, patient voices understanding, and  declined to be sent packet at this time.   RNCM advised of [employer] document scanning  system for Advanced Directives documents via local providers office or hospitals, patient  voices understanding, and will discuss accessing through patient's primary provider if needed in the future.  Patient states does not have any education material, transition of care, care coordination, disease management, disease monitoring, transportation,  or pharmacy needs at this time. States is very appreciative of the follow up and is in agreement to receive [facility] Care Management services.     Objective: Per KPN (Knowledge Performance Now, point of care tool) and chart review, patient hospitalized 11/18/2019 - 11/22/2019 for Drug / Alcohol Assessment, self presented, Alcohol  Detox, at [facility].   Patient hospitalized 11/02/2019 - 11/05/2019 for Acute alcoholic hepatitis and pancreatitis at  [facility].    Patient hospitalized 10/20/2019 - 10/22/2019 for Alcoholic pancreatitis, left AMA (Against Medical Advise) at [facility].   Patient hospitalized 10/11/2019 at [facility] for MDD (  major depressive disorder), on observation status.  Patient had ED visit on 11/18/2019 at [facility] [place] [facility] for Alcohol Problem, Alcohol abuse. Patient had ED visit on  11/17/2019 at  [facility] for Alcohol abuse and Epigastric pain.  Patient had ED visit on 10/31/2019 for Nausea vomiting and diarrhea at [facility].  Patient had ED visit on 10/29/2019 for Abdominal Pain, Alcohol Intoxication at [facility].    Patient had ED visit on 10/25/2019 at [facility] for Acute alcoholic intoxication without complication and with Suicidal ideation.      Patient had ED visit on 10/18/2019 -10/19/2019 for Suicidal and Alcohol Intoxication at [facility]   Patient had ED visit on 10/15/2019 for Alcohol abuse and Depression, unspecified depression type at [facility].   Patient had ED visit on 10/10/2019 for Alcohol abuse at [facility].   Patient also has a history of Major depressive disorder, recurrent episode, severe, hypertension, Alcoholic hepatitis, Alcoholic pancreatitis, Acute pancreatitis, Alcohol use disorder, severe, dependence.    Most recent hospitalization with 11/22/2019 discharge recommended the following follow up: Clinical Follow-up [facility] and/or SA Services):  Name of Provider Agency Referred: [facility] Date/Time of Appointment: Forrestine Him Phone Number: [phone]  Address:[address] [place] [address]  Reason: SA RESIDENTIAL TREATMENT  Clinical Follow-up (MH and/or SA Services):  Name of Provider Agency Referred: [facility] Date/Time of Appointment: WALK IN APPOINTMENT MON-FRI 9-11AM & 2-4PM  Phone Number: [phone] Address: [address] [place], [place] [address]  Reason: MEDICATION MANAGEMENT, CASE MANAGEMENT, THERAPY     Assessment: Received [employer] referral on 11/25/2019.   Referral source: [facility].  Referral reason: Patient has had 7 ED visits and 2 out of network admissions, behavioral health 11/18/2019 - 11/22/2019. Screening follow up completed, will refer to [facility] Care Management Social Worker for counseling  related to alcohol abuse, assist / follow up on alcohol residential detox treatment facility placement,  and  transgender counseling / Architectural technologist.   RNCM will follow up to assess further care management needs.      Plan: RNCM will refer patient to [facility] Care Management Social Worker for counseling related to alcohol abuse, assist / follow up on alcohol residential detox treatment facility placement,  and  transgender counseling / Architectural technologist. RNCM will discuss patient in Multidisciplinary Case Discussion on 12/05/2019.   RNCM will call patient for telephone outreach attempt, within 30 business days, to assess for further CM needs, and proceed with case closure, within 10 business days if no return call, after 4th unsuccessful outreach call.      Gwendoline Judy H. Gardiner Barefoot, BSN, CCM [facility] Care Management [facility] Telephonic CM Phone: [phone] Fax: [phone]

## 2019-12-04 ENCOUNTER — Other Ambulatory Visit: Payer: Self-pay | Admitting: *Deleted

## 2019-12-04 NOTE — Patient Outreach (Signed)
[facility]) Care Management  12/04/2019  Nicolas [name] Jul 01, 1965 [id number]   CSW received referral on 12/02/2019. CSW attempted outreach to pt on 12/03/2019 and was unsuccessful.  CSW was able to leave a HIPPA compliant voice message.  Pt called back and left a return call to which CSW will outreach asap.   [name], MSW, LCSW Clinical Social Worker  [facility] [phone]       [name], MSW, LCSW Clinical Social Worker  [facility] [employer] [employer] [phone]

## 2019-12-04 NOTE — Patient Outreach (Signed)
[facility]) Care Management  12/04/2019  Nicolas [name] 1965/07/19 [id number]   CSW was able to make contact with pt today by phone.  CSW confirmed pt's identity and introduced self, role and reason for call.  Pt shared with CSW plans for going to inpatientdetox/rehab soon.  Pt reports being "ready" and wanting to get detox'ed.  Per pt, in order to prepare and be abe to go to rehab there were a list of bills and things to gthie et done so I can not stress while there over it all".  Pt is planning to call [facility] today to get plans moving forward to be admitted.  Pt reports drinking "just enough to get the shakes away".  Pt also reports going to AA meeting today; and "seeing a friend who is 24months sober made me a little envious".  Pt is optimistic and eager to get to treatment and move on with life/plans/goals.  Pt recently moved to [facility] in November, 2020. A recent relocation/move to a new residence along with detoxing shakes and stress caused me to drink last week". Pt is feeling more settles, bills are paid in advance and states, "I don't want to go but I know I need to go".  CSW offered support, encouragement, validation and reassurance of plans.   Pt also reports after completion of detox/rehab, wanting to look deeper into the "transgender" stressors and get linked with supportive/therapeutic counseling.  CSW offered to assist pt with this search and plan to call pt in a few weeks for updates (hopefully post detox/rehab).  Pt denies SI/HI, reports a lot of good support from friends in [place] and [place].   Pt appreciative of CSW support.   [name], MSW, LCSW Clinical Social Worker  [facility] [employer] [phone]

## 2019-12-19 ENCOUNTER — Other Ambulatory Visit: Payer: Self-pay | Admitting: *Deleted

## 2019-12-19 NOTE — Patient Outreach (Addendum)
[facility]) Care Management  12/19/2019  Nicolas [name] [birth year] [id number]  Late entry for 12/05/2019 and 12/12/2019  Patient's case discussed at  Multidisciplinary Case Discussion (MCD) with [facility] Care Management Team on 12/05/2019 with Kalman Jewels at [facility] [address]  Discussion recommendations: [facility] will provide [employer]  RNCM with a list of in network alcohol treatment  facility to share with patient as needed.  Patient is aware how to access [facility] [employer] benefits and will contact RNCM if assistance needed.  Plan: Update team as needed.   RNCM received voicemail message from [place] on 12/12/2019 states is going to be admitted into an inpatient alcohol treatment facility at 9:00pm on 12/11/2019 and will not have access to his phone.   States will be in the facility until 01/06/2020 or 01/07/2020.   States he is a little nervous about going, will contact RNCM when he gets out of treatment facility,  is looking forward to with this RNCM, and with [facility] team.  States he is very appreciative of everything that has been done to assist him.   12/19/2019 RNCM notified Kalman Jewels at [facility] of patient's voicemail via secure email and ask for name of facility.   RNCM notified [name] Social Worker at [facility] Care Management of patient's voicemail via [facility] / Epic in-basket message.    Plan: RNCM will call patient for telephone outreach attempt, within 30 business days, to assess for further CM needs, and proceed with case closure, within 10 business days if no return call, after 4th unsuccessful outreach call.     Nicolas [name], BSN, CCM [facility] Care Management [facility] Telephonic CM Phone: [phone] Fax: [phone]

## 2019-12-20 ENCOUNTER — Ambulatory Visit: Payer: Self-pay | Admitting: *Deleted

## 2019-12-23 ENCOUNTER — Ambulatory Visit: Payer: Self-pay | Admitting: *Deleted

## 2020-01-10 ENCOUNTER — Ambulatory Visit: Payer: Self-pay | Admitting: *Deleted

## 2020-01-13 ENCOUNTER — Other Ambulatory Visit: Payer: Self-pay | Admitting: *Deleted

## 2020-01-13 NOTE — Patient Outreach (Signed)
[facility]) Care Management  01/13/2020  Nicolas [name] 20-Jun-1965 [id number]   CSW left message for pt earlier today and received a callback from pt who reports he was released from his inpatient substance abuse program.  "I started drinking again". Pt reports; regret tfully, that he has begun drinking and his pancreas is hurting.  He plans to speak to his PCP and is also involved with his sponsor and support.   CSW offered support and encouragement.  Pt states he will likely go back to detox.    CSW encouraged pt to call if needs arise prior to CSW's follow up call.    [name], MSW, LCSW Clinical Social Worker  [facility] [employer] [phone]

## 2020-01-14 ENCOUNTER — Ambulatory Visit: Payer: Self-pay | Admitting: *Deleted

## 2020-01-21 ENCOUNTER — Other Ambulatory Visit: Payer: Self-pay | Admitting: *Deleted

## 2020-01-21 ENCOUNTER — Ambulatory Visit: Payer: Self-pay | Admitting: *Deleted

## 2020-01-21 NOTE — Patient Outreach (Signed)
[facility]) Care Management  01/21/2020  Nicolas [name] 1965/09/24 [id number]   Subjective: Received voicemail message from Sharee Holster, states remembers speaking with RNCM prior to rehab,  out of alcohol rehab, will have 30 days sober, doing great, looking forward to accessing additional services, and requested call back.  Telephone call to patient's home  / mobile number, no answer, left HIPAA compliant voicemail message, and requested call back.    Objective: Per KPN (Knowledge Performance Now, point of care tool) and chart review, patient hospitalized 11/18/2019 - 11/22/2019 for Drug / Alcohol Assessment, self presented, Alcohol  Detox, at [facility].   Patient hospitalized 11/02/2019 - 11/05/2019 for Acute alcoholic hepatitis and pancreatitis at  [facility].    Patient hospitalized 10/20/2019 - 10/22/2019 for Alcoholic pancreatitis, left AMA (Against Medical Advise) at [facility].   Patient hospitalized 10/11/2019 at [facility] for MDD (major depressive disorder), on observation status.  Patient had ED visit on 11/18/2019 at [facility] [place] [facility] for Alcohol Problem, Alcohol abuse. Patient had ED visit on 11/17/2019 at  [facility] for Alcohol abuse and Epigastric pain.  Patient had ED visit on 10/31/2019 for Nausea vomiting and diarrhea at [facility].  Patient had ED visit on 10/29/2019 for Abdominal Pain, Alcohol Intoxication at [facility].    Patient had ED visit on 10/25/2019 at [facility] for Acute alcoholic intoxication without complication and with Suicidal ideation.      Patient had ED visit on 10/18/2019 -10/19/2019 for Suicidal and Alcohol Intoxication at [facility].   Patient had ED visit on 10/15/2019 for Alcohol abuse and Depression, unspecified depression type at [facility].   Patient had ED visit on 10/10/2019 for Alcohol abuse at [facility].   Patient also has a history of Major depressive disorder, recurrent episode, severe, hypertension, Alcoholic hepatitis, Alcoholic pancreatitis, Acute pancreatitis, Alcohol use disorder, severe, dependence.    Most recent hospitalization with 11/22/2019 discharge recommended the following follow up: Clinical Follow-up [facility] and/or SA Services):  Name of Provider Agency Referred: [facility] Date/Time of Appointment: Forrestine Him Phone Number: [phone]  Address:[address] [place] [address]  Reason: SA RESIDENTIAL TREATMENT  Clinical Follow-up (MH and/or SA Services):  Name of Provider Agency Referred: [facility] Date/Time of Appointment: WALK IN APPOINTMENT MON-FRI 9-11AM & 2-4PM  Phone Number: [phone] Address: [address] [place], [place] [address]  Reason: MEDICATION MANAGEMENT, CASE MANAGEMENT, THERAPY     Assessment: Received [employer] referral on 11/25/2019.   Referral source: [facility]  Referral reason: Patient has had 7 ED visits and 2 out of network admissions, behavioral health 11/18/2019 - 11/22/2019. Screening follow up completed, will refer to [facility] Care Management Social Worker for counseling related to alcohol abuse, assist / follow up on alcohol residential detox treatment facility placement,  and  transgender counseling / Architectural technologist.   RNCM will follow up to assess further care management needs.      Plan: RNCM will call patient for 2nd telephone outreach attempt, within 7  business days, to assess for further CM needs, and proceed with case closure, after 4th unsuccessful outreach call.     Laksh Hinners H. Gardiner Barefoot, BSN, CCM [facility] Care Management [facility] Telephonic CM Phone: [phone] Fax: [phone]

## 2020-01-22 ENCOUNTER — Ambulatory Visit: Payer: Self-pay | Admitting: *Deleted

## 2020-01-23 ENCOUNTER — Other Ambulatory Visit: Payer: Self-pay | Admitting: *Deleted

## 2020-01-23 NOTE — Patient Outreach (Signed)
[facility]) Care Management  01/23/2020  Nicolas [name] 08-08-1965 [id number]   LATE ENTRY  CSW attempted a follow up outreach call to pt and was unsuccessful.  CSW left a HIPPA compliant voice message and will attempt outreach again per policy if no return call is received.   [name], MSW, LCSW Clinical Social Worker  [facility] [employer] [phone]

## 2020-01-24 ENCOUNTER — Ambulatory Visit: Payer: Self-pay | Admitting: *Deleted

## 2020-01-26 ENCOUNTER — Encounter (HOSPITAL_COMMUNITY): Payer: Self-pay | Admitting: Emergency Medicine

## 2020-01-26 ENCOUNTER — Emergency Department (HOSPITAL_COMMUNITY): Admission: EM | Admit: 2020-01-26 | Discharge: 2020-01-26 | Discharge status: Against Medical Advice | Payer: 59

## 2020-01-26 ENCOUNTER — Emergency Department (HOSPITAL_COMMUNITY)
Admission: EM | Admit: 2020-01-26 | Discharge: 2020-01-26 | Disposition: A | Discharge status: Home / Self Care | Payer: 59 | Attending: Emergency Medicine | Admitting: Emergency Medicine

## 2020-01-26 ENCOUNTER — Other Ambulatory Visit: Payer: Self-pay

## 2020-01-26 DIAGNOSIS — K2920 Alcoholic gastritis without bleeding: Secondary | ICD-10-CM | POA: Insufficient documentation

## 2020-01-26 DIAGNOSIS — Z79899 Other long term (current) drug therapy: Secondary | ICD-10-CM | POA: Diagnosis not present

## 2020-01-26 DIAGNOSIS — F17210 Nicotine dependence, cigarettes, uncomplicated: Secondary | ICD-10-CM | POA: Insufficient documentation

## 2020-01-26 DIAGNOSIS — R109 Unspecified abdominal pain: Secondary | ICD-10-CM | POA: Diagnosis present

## 2020-01-26 DIAGNOSIS — K4090 Unilateral inguinal hernia, without obstruction or gangrene, not specified as recurrent: Secondary | ICD-10-CM | POA: Diagnosis not present

## 2020-01-26 HISTORY — DX: Acute pancreatitis without necrosis or infection, unspecified: K85.90

## 2020-01-26 LAB — COMPREHENSIVE METABOLIC PANEL
ALT: 71 U/L — ABNORMAL HIGH (ref 0–44)
AST: 55 U/L — ABNORMAL HIGH (ref 15–41)
Albumin: 4.3 g/dL (ref 3.5–5.0)
Alkaline Phosphatase: 50 U/L (ref 38–126)
Anion gap: 13 (ref 5–15)
BUN: <5 mg/dL — ABNORMAL LOW (ref 6–20)
CO2: 24 mmol/L (ref 22–32)
Calcium: 9.5 mg/dL (ref 8.9–10.3)
Chloride: 106 mmol/L (ref 98–111)
Creatinine, Ser: 0.93 mg/dL (ref 0.61–1.24)
GFR calc Af Amer: >60 mL/min (ref >60)
GFR calc non Af Amer: >60 mL/min (ref >60)
Glucose, Bld: 120 mg/dL — ABNORMAL HIGH (ref 70–99)
Potassium: 3.5 mmol/L (ref 3.5–5.1)
Sodium: 143 mmol/L (ref 135–145)
Total Bilirubin: 0.6 mg/dL (ref 0.3–1.2)
Total Protein: 7.2 g/dL (ref 6.5–8.1)

## 2020-01-26 LAB — URINALYSIS, ROUTINE W REFLEX MICROSCOPIC
Bilirubin Urine: NEGATIVE
Glucose, UA: NEGATIVE mg/dL
Hgb urine dipstick: NEGATIVE
Ketones, ur: NEGATIVE mg/dL
Leukocytes,Ua: NEGATIVE
Nitrite: NEGATIVE
Protein, ur: NEGATIVE mg/dL
Specific Gravity, Urine: 1.014 (ref 1.005–1.030)
pH: 8 (ref 5.0–8.0)

## 2020-01-26 LAB — CBC
HCT: 44 % (ref 39.0–52.0)
Hemoglobin: 14.7 g/dL (ref 13.0–17.0)
MCH: 34.3 pg — ABNORMAL HIGH (ref 26.0–34.0)
MCHC: 33.4 g/dL (ref 30.0–36.0)
MCV: 102.8 fL — ABNORMAL HIGH (ref 80.0–100.0)
Platelets: 271 10*3/uL (ref 150–400)
RBC: 4.28 MIL/uL (ref 4.22–5.81)
RDW: 12.4 % (ref 11.5–15.5)
WBC: 6.6 10*3/uL (ref 4.0–10.5)
nRBC: 0 % (ref 0.0–0.2)

## 2020-01-26 LAB — LIPASE, BLOOD: Lipase: 20 U/L (ref 11–51)

## 2020-01-26 MED ORDER — FAMOTIDINE 20 MG PO TABS
20.0000 mg | ORAL_TABLET | Freq: Two times a day (BID) | ORAL | 0 refills | Status: DC
Start: 2020-01-26 — End: 2020-04-21

## 2020-01-26 MED ORDER — ONDANSETRON 4 MG PO TBDP
4.0000 mg | ORAL_TABLET | Freq: Once | ORAL | Status: AC
  Administered 2020-01-26: 4 mg via ORAL
  Filled 2020-01-26: qty 1

## 2020-01-26 MED ORDER — ONDANSETRON 4 MG PO TBDP: 4.0000 mg | ORAL_TABLET | Freq: Once | ORAL | Status: DC | PRN

## 2020-01-26 MED ORDER — LORAZEPAM 1 MG PO TABS
1.0000 mg | ORAL_TABLET | Freq: Once | ORAL | Status: AC
  Administered 2020-01-26: 1 mg via ORAL
  Filled 2020-01-26: qty 1

## 2020-01-26 MED ORDER — ONDANSETRON 4 MG PO TBDP
4.0000 mg | ORAL_TABLET | Freq: Three times a day (TID) | ORAL | 0 refills | Status: DC | PRN
Start: 2020-01-26 — End: 2020-06-17

## 2020-01-26 MED ORDER — CHLORDIAZEPOXIDE HCL 25 MG PO CAPS: 50.0000 mg | ORAL_CAPSULE | Freq: Three times a day (TID) | ORAL | 0 refills | Status: DC | PRN

## 2020-01-26 MED ORDER — FAMOTIDINE 20 MG PO TABS
20.0000 mg | ORAL_TABLET | Freq: Once | ORAL | Status: AC
  Administered 2020-01-26: 20 mg via ORAL
  Filled 2020-01-26: qty 1

## 2020-01-26 NOTE — ED Triage Notes (Signed)
Patient sent to ED from an urgent care with complaint of abdominal pain and nausea. Patient has history of alcoholism and pancreatitis, has been drinking approximately 10 beers per day for the last four days. Patient alert, oriented, and ambulatory at this time.

## 2020-01-26 NOTE — ED Notes (Signed)
Called patient to be triage and no one responded

## 2020-01-26 NOTE — ED Triage Notes (Signed)
Pt walked out and stated that he wasn't waiting for a room.

## 2020-01-26 NOTE — ED Provider Notes (Signed)
[name] EMERGENCY DEPARTMENT Provider Note   CSN: [id number] Arrival date & time: 01/26/20  1452     History Chief Complaint  Patient presents with  . Abdominal Pain    Nicolas [name] is a 54 y.o. adult.  54 year old male with past medical history below including alcohol abuse, pancreatitis, depression, GERD who presents with abdominal pain and nausea.  Patient reports that recently he has been at several rehab facilities for alcohol.  4 days ago he relapsed and has been binge drinking, 10 beers per day.  Last drink was this morning.  He was having severe upper abdominal pain this morning but the pain has somewhat improved throughout the day as he has abstained from alcohol.  He reports nausea without vomiting.  He has had some diarrhea.  No fevers.  He reports feeling jittery and reports history of alcohol withdrawal symptoms although he denies any alcohol withdrawal seizures.  He denies any sick contacts.  Of note, he reports h/o L inguinal hernia that gets bigger when he coughs. No pain currently.   The history is provided by the patient.  Abdominal Pain      Past Medical History:  Diagnosis Date  . Alcohol abuse   . Pancreatitis     Patient Active Problem List   Diagnosis Date Noted  . GERD (gastroesophageal reflux disease) 11/03/2019  . Alcoholic hepatitis 11/02/2019  . Major depressive disorder, recurrent episode, severe (HCC) 10/21/2019  . 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 Acute pancreatitis 10/20/2019  . Alcohol use disorder, severe, dependence (HCC) 10/16/2019    Past Surgical History:  Procedure Laterality Date  . NASAL SEPTUM SURGERY         No family history on file.  Social History   Tobacco Use  . Smoking status: Current Every Day Smoker    Packs/day: 0.50    Types: Cigarettes  . Smokeless tobacco: Never Used  Vaping Use  . Vaping Use: Never used  Substance Use Topics  . Alcohol use: Yes    Alcohol/week: 6.0 standard  drinks    Types: 6 Cans of beer per week    Comment: daily use  . Drug use: Not Currently    Types: Cocaine    Comment: stopped 01/2019    Home Medications Prior to Admission medications   Medication Sig Start Date End Date Taking? Authorizing Provider  atenolol (TENORMIN) 25 MG tablet Take 25 mg by mouth daily. 08/31/19  Yes [provider]  fenofibrate 160 MG tablet Take by mouth. 11/22/19  Yes [provider]  Multiple Vitamin (MULTIVITAMIN WITH MINERALS) TABS tablet Take 1 tablet by mouth daily.   Yes [provider]  spironolactone (ALDACTONE) 50 MG tablet Take 50 mg by mouth 2 (two) times daily. 08/22/19  Yes [provider]  sucralfate (CARAFATE) 1 g tablet Take 1 tablet (1 g total) by mouth 4 (four) times daily -  with meals and at bedtime for 14 days. Patient taking differently: Take 1 g by mouth daily.  10/31/19 01/26/20 Yes [name], Shawn C, PA-C  chlordiazePOXIDE (LIBRIUM) 25 MG capsule Take 2 capsules (50 mg total) by mouth 3 (three) times daily as needed for withdrawal. 50mg  by mouth q 6 h day 1 50mg  by mouth q 8 h day 2 50mg  by mouth q 12 h day 3 50mg  by mough QHS day 4 01/26/20   Harlean Regula, , MD  famotidine (PEPCID) 20 MG tablet Take 1 tablet (20 mg total) by mouth 2 (two) times  daily. 01/26/20   Lyon Dumont, Ambrose Finland, MD  hydrOXYzine (ATARAX/VISTARIL) 25 MG tablet Take 25 mg by mouth at bedtime as needed for anxiety (sleep).  Patient not taking: Reported on 11/29/2019 06/27/19   [provider]  ondansetron (ZOFRAN ODT) 4 MG disintegrating tablet Take 1 tablet (4 mg total) by mouth every 8 (eight) hours as needed for nausea or vomiting. 01/26/20   Gerrard Crystal, Ambrose Finland, MD  pantoprazole (PROTONIX) 20 MG tablet Take 1 tablet (20 mg total) by mouth daily. Patient not taking: Reported on 01/26/2020 10/31/19 12/30/19  Harolyn Rutherford C, PA-C    Allergies    Bean pod extract and Caffeine  Review of Systems   Review of Systems    Gastrointestinal: Positive for abdominal pain.   All other systems reviewed and are negative except that which was mentioned in HPI  Physical Exam Updated Vital Signs BP 128/89   Pulse 72   Temp 98 F (36.7 C) (Oral)   Resp 18   SpO2 97%   Physical Exam Vitals and nursing note reviewed.  Constitutional:      General: Nicolas [name] is not in acute distress.    Appearance: [name] is well-developed.  HENT:     Head: Normocephalic and atraumatic.  Eyes:     Conjunctiva/sclera: Conjunctivae normal.  Cardiovascular:     Rate and Rhythm: Normal rate and regular rhythm.     Heart sounds: Normal heart sounds. No murmur heard.   Pulmonary:     Effort: Pulmonary effort is normal.     Breath sounds: Normal breath sounds.  Abdominal:     General: Bowel sounds are normal. There is no distension.     Palpations: Abdomen is soft.     Tenderness: There is no abdominal tenderness.     Comments: L inguinal hernia, reducible and non-tender  Musculoskeletal:     Cervical back: Neck supple.  Skin:    General: Skin is warm and dry.  Neurological:     Mental Status: [name] is alert and oriented to person, place, and time.     Comments: Fluent speech  Psychiatric:        Attention and Perception: Attention normal.        Mood and Affect: Mood is anxious.        Speech: Speech is rapid and pressured.        Behavior: Behavior is hyperactive. Behavior is cooperative.        Cognition and Memory: Cognition and memory normal.     ED Results / Procedures / Treatments   Labs (all labs ordered are listed, but only abnormal results are displayed) Labs Reviewed  COMPREHENSIVE METABOLIC PANEL - Abnormal; Notable for the following components:      Result Value   Glucose, Bld 120 (*)    BUN <5 (*)    AST 55 (*)    ALT 71 (*)    All other components within normal limits  CBC - Abnormal; Notable for the following components:   MCV 102.8 (*)    MCH 34.3 (*)    All other  components within normal limits  LIPASE, BLOOD  URINALYSIS, ROUTINE W REFLEX MICROSCOPIC    EKG None  Radiology No results found.  Procedures Procedures (including critical care time)  Medications Ordered in ED Medications  ondansetron (ZOFRAN-ODT) disintegrating tablet 4 mg (has no administration in time range)  ondansetron (ZOFRAN-ODT) disintegrating tablet 4 mg (4 mg Oral Given 01/26/20 2115)  LORazepam (ATIVAN) tablet 1  mg (1 mg Oral Given 01/26/20 2114)  famotidine (PEPCID) tablet 20 mg (20 mg Oral Given 01/26/20 2114)  LORazepam (ATIVAN) tablet 1 mg (1 mg Oral Given 01/26/20 2132)    ED Course  I have reviewed the triage vital signs and the nursing notes.  Pertinent labs  that were available during my care of the patient were reviewed by me and considered in my medical decision making (see chart for details).    MDM Rules/Calculators/A&P                          Patient was awake and alert, well-appearing with normal vital signs.  Mild tremulousness which may be related to mild alcohol withdrawal symptoms but no significant vital sign abnormalities to suggest life-threatening withdrawal symptoms.  No focal abdominal tenderness on my exam and does admit that abdominal pain has improved throughout the day.  His lab work is reassuring with normal lipase, only slightly elevated LFTs, normal bilirubin, reassuring CBC.  He is tolerating p.o. here.  Gave a dose of oral Ativan as well as Zofran and Pepcid.  I suspect alcoholic gastritis from recent binge drinking.  I have counseled on supportive measures and provided medications to use at home.  Emphasized importance of ongoing outpatient follow-up for his alcohol management including AA. He will f/u with [facility] for his chronic hernia.  Final Clinical Impression(s) / ED Diagnoses Final diagnoses:  Acute alcoholic gastritis without hemorrhage    Rx / DC Orders ED Discharge Orders         Ordered    famotidine  (PEPCID) 20 MG tablet  2 times daily     Discontinue  Reprint     01/26/20 2215    chlordiazePOXIDE (LIBRIUM) 25 MG capsule  3 times daily PRN     Discontinue  Reprint     01/26/20 2215    ondansetron (ZOFRAN ODT) 4 MG disintegrating tablet  Every 8 hours PRN     Discontinue  Reprint     01/26/20 2215           Khyre Germond, Ambrose Finland, MD 01/26/20 2229

## 2020-01-26 NOTE — ED Triage Notes (Signed)
Pt BIB [facility]. Pt reports that he has been drinking for 4 days and wants detox. Reports generalized abdominal pain and diarrhea.

## 2020-01-28 ENCOUNTER — Ambulatory Visit: Payer: Self-pay | Admitting: *Deleted

## 2020-01-30 ENCOUNTER — Other Ambulatory Visit: Payer: Self-pay | Admitting: *Deleted

## 2020-01-30 NOTE — Patient Outreach (Signed)
[facility]) Care Management  01/30/2020  Nicolas [name] [birth year] [id number]   Telephone Assessment-Unsuccessful  RN attempted outreach call however unsuccessful. RN able to leave a HIPAA approved voice message requesting a call back. Will further engage at that time.  Plan: Will follow up next week with another outreach call.  Elliot Cousin, RN Care Management Coordinator [facility] Main Office [phone]

## 2020-01-31 NOTE — Patient Outreach (Signed)
[facility]) Care Management  01/31/2020  Nicolas [name] 1966-03-26 [id number]   CSW made contact with pt on 01/30/2020 and attempted to provide him with info on area mental health providers in network.  Pt reports he could not write the info down and asked that I mail it to him.  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    CSW will mail pt the material as requested and encouraged him to call if questions or needs arise before CSW follow up call.   [name], MSW, LCSW Clinical Social Worker  [facility] [phone]

## 2020-02-04 ENCOUNTER — Other Ambulatory Visit: Payer: Self-pay | Admitting: *Deleted

## 2020-02-04 NOTE — Patient Outreach (Signed)
[facility]) Care Management  02/04/2020  Nicolas [name] 20-Apr-1966 [id number]   Outreach #2  RN attempted outreach however unsuccessful. RN able to leave a HIPAA approved voice message requesting a call back.  Plan: Will plan another outreach call next week for pending services.  Elliot Cousin, RN Care Management Coordinator [facility] Main Office [phone]

## 2020-02-10 ENCOUNTER — Other Ambulatory Visit: Payer: Self-pay | Admitting: *Deleted

## 2020-02-10 NOTE — Patient Outreach (Signed)
[facility]) Care Management  02/10/2020  Nicolas [name] Mar 03, 1966 [id number]   Telephone Assessment  RN spoke briefly with pt who requested a call back tomorrow indicating out of town.   Plan: Will follow up tomorrow for pending services at that time.   Elliot Cousin, RN Care Management Coordinator [facility] Main Office [phone]

## 2020-02-11 ENCOUNTER — Other Ambulatory Visit: Payer: Self-pay | Admitting: *Deleted

## 2020-02-11 ENCOUNTER — Ambulatory Visit: Payer: Self-pay | Admitting: *Deleted

## 2020-02-11 NOTE — Patient Outreach (Signed)
[facility]) Care Management  02/11/2020  Chesky Heyer Jan 04, 1966 [id number]   Telephone Assessment-Unsuccessful  RN followed up with pt has requested however unsuccessful. RN left a HIPAA approval voice message requesting a call back.  Plan: Will rescheduled another outreach call over the next week.  Elliot Cousin, RN Care Management Coordinator [facility] Main Office [phone]

## 2020-02-11 NOTE — Patient Outreach (Signed)
[facility]) Care Management  02/11/2020  Nicolas [name] [birth year] [id number]    CSW attempted to reach pt by phone today and was unable. CSW left a HIPPA compliant voice message and will try again in 3-4 business days per policy if no return call is received.   [name], MSW, LCSW Clinical Social Worker  [facility] [phone]

## 2020-02-14 ENCOUNTER — Other Ambulatory Visit: Payer: Self-pay | Admitting: *Deleted

## 2020-02-14 NOTE — Patient Outreach (Addendum)
[facility]) Care Management  02/14/2020  Wyett Narine July 01, 1965 [id number]   CSW spoke with pt who reports he is driving back to [place]; "I am on the highway in [place]". Pt shared that he is wanting to find a provider for mental health support/counseling that "is not a five minute chat".  Pt is wanting to find psychotherapy as well as inpatient detox options that are covered by his insurance.  CSW reminded pt of list of providers that was mailed to him per his request. Pt reports he is not back in [place] yet and would like assistance locating possible options for both; [facility] (inpatient detox and outpatient counseling).    CSW offered to seek [facility] assistance and follow up with him,  Pt appreciative. Pt denies SI/HI yet does express frustration with getting the services he wants/needs.  CSW will update pt with info as received.   [name], MSW, LCSW Clinical Social Worker  [facility] [phone]      ADDENDUM: LATE ENTRY  CSW was able to make contact with [facility] rep on Friday pm.  CSW was able to connect with their "Care Management department".  CSW spoke with [facility], Care Navigator, and they plan to contact pt to assist with resources for inpatient options.  CSW updated pt of this as well. Pt was still enroute to home/[place] and voiced frustration of the "timing" of both [facility] team and [facility] CSW outreach calls.   [name], MSW, LCSW Clinical Social Worker  [facility] [phone]

## 2020-02-18 ENCOUNTER — Other Ambulatory Visit: Payer: Self-pay | Admitting: *Deleted

## 2020-02-18 NOTE — Patient Outreach (Signed)
[facility]) Care Management  02/18/2020  Nicolas [name] [birth year] [id number]   Telephone Assessment-Unsuccessful  Several attempts to reach this pt however remains unsuccessful and no response to the outreach letter. RN able to leave a HIPAA approved voice message requesting a call back. Will further engage at that time.  Plan: Will follow up in one month with another outreach call prior to closing this case.  Elliot Cousin, RN Care Management Coordinator [facility] Main Office [phone]

## 2020-02-18 NOTE — Patient Outreach (Signed)
[facility]) Care Management  02/18/2020  Nicolas [name] 1966/04/01 [id number]   CSW made contact with pt who reports he had just gotten off the phone with his Insurance Case Manager.  Pt shared with CSW that his insurance company is working to get him into a rehab center for detox and treatment.  "I told them it has to be a program that does medical detox because I have to have Ativan for the withdrawals".  Pt is very appreciative of the assistance being provided by CSW to connect with his [facility].  Pt is optimistic about his detox/treatment plans. "I want to live, I want to not listen to the inner voices telling me to drink".  Pt expressed to CSW his desire to live, love, have relationships/friendships, and to "deal with the past that keeps surfacing".  Pt remains interested in getting linked with a (male) counselor/therapist for on-going psycho-therapy once he is out of the rehab.  CSW will continue conversations with his [facility] insurance CM to locate options for pt near his home in [place].    CSW offered pt encouragement, praise and acknowledged his self awareness of this (alcohol dependency) disease help him to form a strong foundation before heading to inpatient detox/treatment this week.   Pt plans to call CSW and update as he can; prior to being admitted if possible and after he is released.  CSW will also plan to outreach pt in 2-3 week    [name], MSW, LCSW Clinical Social Worker  [facility] [phone]

## 2020-02-19 ENCOUNTER — Emergency Department (HOSPITAL_COMMUNITY)
Admission: EM | Admit: 2020-02-19 | Discharge: 2020-02-20 | Disposition: A | Discharge status: Home / Self Care | Payer: 59 | Attending: Emergency Medicine | Admitting: Emergency Medicine

## 2020-02-19 ENCOUNTER — Other Ambulatory Visit: Payer: Self-pay

## 2020-02-19 ENCOUNTER — Encounter (HOSPITAL_COMMUNITY): Payer: Self-pay | Admitting: *Deleted

## 2020-02-19 DIAGNOSIS — F10129 Alcohol abuse with intoxication, unspecified: Secondary | ICD-10-CM | POA: Insufficient documentation

## 2020-02-19 DIAGNOSIS — F17210 Nicotine dependence, cigarettes, uncomplicated: Secondary | ICD-10-CM | POA: Insufficient documentation

## 2020-02-19 DIAGNOSIS — I10 Essential (primary) hypertension: Secondary | ICD-10-CM | POA: Insufficient documentation

## 2020-02-19 DIAGNOSIS — F419 Anxiety disorder, unspecified: Secondary | ICD-10-CM | POA: Insufficient documentation

## 2020-02-19 DIAGNOSIS — R251 Tremor, unspecified: Secondary | ICD-10-CM | POA: Insufficient documentation

## 2020-02-19 DIAGNOSIS — F1029 Alcohol dependence with unspecified alcohol-induced disorder: Secondary | ICD-10-CM

## 2020-02-19 DIAGNOSIS — Z79899 Other long term (current) drug therapy: Secondary | ICD-10-CM | POA: Insufficient documentation

## 2020-02-19 HISTORY — DX: Essential (primary) hypertension: I10

## 2020-02-19 LAB — CBC
HCT: 46.9 % (ref 39.0–52.0)
Hemoglobin: 15.4 g/dL (ref 13.0–17.0)
MCH: 34.6 pg — ABNORMAL HIGH (ref 26.0–34.0)
MCHC: 32.8 g/dL (ref 30.0–36.0)
MCV: 105.4 fL — ABNORMAL HIGH (ref 80.0–100.0)
Platelets: 232 10*3/uL (ref 150–400)
RBC: 4.45 MIL/uL (ref 4.22–5.81)
RDW: 12.8 % (ref 11.5–15.5)
WBC: 5.4 10*3/uL (ref 4.0–10.5)
nRBC: 0 % (ref 0.0–0.2)

## 2020-02-19 LAB — COMPREHENSIVE METABOLIC PANEL
ALT: 84 U/L — ABNORMAL HIGH (ref 0–44)
AST: 97 U/L — ABNORMAL HIGH (ref 15–41)
Albumin: 4.3 g/dL (ref 3.5–5.0)
Alkaline Phosphatase: 59 U/L (ref 38–126)
Anion gap: 15 (ref 5–15)
BUN: <5 mg/dL — ABNORMAL LOW (ref 6–20)
CO2: 22 mmol/L (ref 22–32)
Calcium: 9.8 mg/dL (ref 8.9–10.3)
Chloride: 104 mmol/L (ref 98–111)
Creatinine, Ser: 0.93 mg/dL (ref 0.61–1.24)
GFR calc Af Amer: >60 mL/min (ref >60)
GFR calc non Af Amer: >60 mL/min (ref >60)
Glucose, Bld: 108 mg/dL — ABNORMAL HIGH (ref 70–99)
Potassium: 3.7 mmol/L (ref 3.5–5.1)
Sodium: 141 mmol/L (ref 135–145)
Total Bilirubin: 0.5 mg/dL (ref 0.3–1.2)
Total Protein: 6.9 g/dL (ref 6.5–8.1)

## 2020-02-19 LAB — RAPID URINE DRUG SCREEN, HOSP PERFORMED
Amphetamines: NOT DETECTED
Barbiturates: NOT DETECTED
Benzodiazepines: POSITIVE — AB
Cocaine: NOT DETECTED
Opiates: NOT DETECTED
Tetrahydrocannabinol: NOT DETECTED

## 2020-02-19 LAB — ETHANOL: Alcohol, Ethyl (B): 168 mg/dL — ABNORMAL HIGH (ref <10)

## 2020-02-19 NOTE — ED Notes (Signed)
Patient states he doesn't want to sit without ativan for 11 hours because if that's the case he can just go get a drink and then go to detox but he came here to get help. Complains of belly pain. Advised him not to leave to drink because that would defeat the purpose of coming to get help and reassured him he was being taken care of

## 2020-02-19 NOTE — ED Triage Notes (Signed)
The pt reports that he wants to be de toxed from alcohol his last alcohol was 2 hopurs ago  He is trying to decide to go ot stay

## 2020-02-20 ENCOUNTER — Other Ambulatory Visit: Payer: Self-pay | Admitting: *Deleted

## 2020-02-20 LAB — LIPASE, BLOOD: Lipase: 25 U/L (ref 11–51)

## 2020-02-20 MED ORDER — ONDANSETRON 4 MG PO TBDP
4.0000 mg | ORAL_TABLET | Freq: Once | ORAL | Status: AC
  Administered 2020-02-20: 4 mg via ORAL
  Filled 2020-02-20: qty 1

## 2020-02-20 MED ORDER — IBUPROFEN 800 MG PO TABS
800.0000 mg | ORAL_TABLET | Freq: Once | ORAL | Status: AC
  Administered 2020-02-20: 800 mg via ORAL
  Filled 2020-02-20: qty 1

## 2020-02-20 MED ORDER — LORAZEPAM 1 MG PO TABS
2.0000 mg | ORAL_TABLET | Freq: Once | ORAL | Status: AC
  Administered 2020-02-20: 2 mg via ORAL
  Filled 2020-02-20: qty 2

## 2020-02-20 MED ORDER — CHLORDIAZEPOXIDE HCL 25 MG PO CAPS
ORAL_CAPSULE | ORAL | 0 refills | Status: DC
Start: 2020-02-20 — End: 2020-05-05

## 2020-02-20 NOTE — ED Provider Notes (Signed)
[facility] EMERGENCY DEPARTMENT Provider Note   CSN: [id number] Arrival date & time: 02/19/20  2054     History Chief Complaint  Patient presents with  . wants detox    Nicolas [name] is a 54 y.o. adult.  Patient is a 54 year old male with past medical history of alcohol abuse, GERD, hypertension, and alcohol-related pancreatitis.  He presents today requesting help/detox from alcohol.  Patient tells me he drinks alcohol daily from when he wakes up in the morning until he goes to bed at night.  He has not had any alcohol in the past several hours and already feels as though he is going into withdrawal.  He describes feeling shaky and anxious.  Patient is requesting to go to detox.  He was recently in an inpatient facility throughout the month of July, however resumed drinking immediately upon leaving the facility.  The history is provided by the patient.       Past Medical History:  Diagnosis Date  . Alcohol abuse   . Hypertension   . Pancreatitis     Patient Active Problem List   Diagnosis Date Noted  . GERD (gastroesophageal reflux disease) 11/03/2019  . Alcoholic hepatitis 11/02/2019  . Major depressive disorder, recurrent episode, severe (HCC) 10/21/2019  . 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 Acute pancreatitis 10/20/2019  . Alcohol use disorder, severe, dependence (HCC) 10/16/2019    Past Surgical History:  Procedure Laterality Date  . NASAL SEPTUM SURGERY         No family history on file.  Social History   Tobacco Use  . Smoking status: Current Every Day Smoker    Packs/day: 0.50    Types: Cigarettes  . Smokeless tobacco: Never Used  Vaping Use  . Vaping Use: Never used  Substance Use Topics  . Alcohol use: Yes    Alcohol/week: 6.0 standard drinks    Types: 6 Cans of beer per week    Comment: daily use  . Drug use: Not Currently    Types: Cocaine    Comment: stopped 01/2019    Home Medications Prior to Admission medications     Medication Sig Start Date End Date Taking? Authorizing Provider  atenolol (TENORMIN) 25 MG tablet Take 25 mg by mouth daily. 08/31/19   [provider]  chlordiazePOXIDE (LIBRIUM) 25 MG capsule Take 2 capsules (50 mg total) by mouth 3 (three) times daily as needed for withdrawal. 50mg  by mouth q 6 h day 1 50mg  by mouth q 8 h day 2 50mg  by mouth q 12 h day 3 50mg  by mough QHS day 4 01/26/20   Little, , MD  famotidine (PEPCID) 20 MG tablet Take 1 tablet (20 mg total) by mouth 2 (two) times daily. 01/26/20   Little, , MD  fenofibrate 160 MG tablet Take by mouth. 11/22/19   [provider]  hydrOXYzine (ATARAX/VISTARIL) 25 MG tablet Take 25 mg by mouth at bedtime as needed for anxiety (sleep).  Patient not taking: Reported on 11/29/2019 06/27/19   [provider]  Multiple Vitamin (MULTIVITAMIN WITH MINERALS) TABS tablet Take 1 tablet by mouth daily.    [provider]  ondansetron (ZOFRAN ODT) 4 MG disintegrating tablet Take 1 tablet (4 mg total) by mouth every 8 (eight) hours as needed for nausea or vomiting. 01/26/20   Little, 01/22/20, MD  pantoprazole (PROTONIX) 20 MG tablet Take 1 tablet (20 mg total) by mouth daily. Patient not taking: Reported on 01/26/2020 10/31/19 12/30/19  [name], Shawn C, PA-C  spironolactone (ALDACTONE) 50 MG tablet Take 50 mg by mouth 2 (two) times daily. 08/22/19   [provider]  sucralfate (CARAFATE) 1 g tablet Take 1 tablet (1 g total) by mouth 4 (four) times daily -  with meals and at bedtime for 14 days. Patient taking differently: Take 1 g by mouth daily.  10/31/19 01/26/20  [name], Ines Bloomer C, PA-C    Allergies    Bean pod extract and Caffeine  Review of Systems   Review of Systems  All other systems reviewed and are negative.   Physical Exam Updated Vital Signs BP (!) 151/98 (BP Location: Right Arm)   Pulse 83   Temp 98 F (36.7 C) (Oral)   Resp 16   Ht 5\' 9"  (1.753 m)   Wt 81.7 kg   SpO2  98%   BMI 26.60 kg/m   Physical Exam Vitals and nursing note reviewed.  Constitutional:      General: Nicolas [name] is not in acute distress.    Appearance: Nicolas [name] is well-developed. Nicolas [name] is not diaphoretic.     Comments: Patient in no distress, but appears somewhat anxious.  HENT:     Head: Normocephalic and atraumatic.  Cardiovascular:     Rate and Rhythm: Normal rate and regular rhythm.     Heart sounds: No murmur heard.  No friction rub. No gallop.   Pulmonary:     Effort: Pulmonary effort is normal. No respiratory distress.     Breath sounds: Normal breath sounds. No wheezing.  Abdominal:     General: Bowel sounds are normal. There is no distension.     Palpations: Abdomen is soft.     Tenderness: There is no abdominal tenderness.  Musculoskeletal:        General: Normal range of motion.     Cervical back: Normal range of motion and neck supple.  Skin:    General: Skin is warm and dry.  Neurological:     Mental Status: Nicolas [name] is alert and oriented to person, place, and time.     ED Results / Procedures / Treatments   Labs (all labs ordered are listed, but only abnormal results are displayed) Labs Reviewed  COMPREHENSIVE METABOLIC PANEL - Abnormal; Notable for the following components:      Result Value   Glucose, Bld 108 (*)    BUN <5 (*)    AST 97 (*)    ALT 84 (*)    All other components within normal limits  ETHANOL - Abnormal; Notable for the following components:   Alcohol, Ethyl (B) 168 (*)    All other components within normal limits  CBC - Abnormal; Notable for the following components:   MCV 105.4 (*)    MCH 34.6 (*)    All other components within normal limits  RAPID URINE DRUG SCREEN, HOSP PERFORMED - Abnormal; Notable for the following components:   Benzodiazepines POSITIVE (*)    All other components within normal limits  LIPASE, BLOOD    EKG None  Radiology No results found.  Procedures Procedures (including  critical care time)  Medications Ordered in ED Medications  LORazepam (ATIVAN) tablet 2 mg (has no administration in time range)    ED Course  I have reviewed the triage vital signs and the nursing notes.  Pertinent labs & imaging results that were available during my care of the patient were reviewed by me and considered in my medical decision making (see chart for  details).    MDM Rules/Calculators/A&P  Patient is a 54 year old male with history of chronic alcoholism and frequent ER visits requesting detox.  Patient tells me every time he gets home from detox, he begins drinking almost immediately.  He feels as though he is in withdrawal.  Patient's vital signs are stable.  He was given Ativan and is ambulatory at his exam room without difficulty.  Laboratory studies are reassuring.  At this point, patient seems appropriate for discharge with outpatient resources and as needed return.  Final Clinical Impression(s) / ED Diagnoses Final diagnoses:  None    Rx / DC Orders ED Discharge Orders    None       Geoffery Lyons, MD 02/20/20 [phone]

## 2020-02-20 NOTE — Patient Outreach (Deleted)
[facility]) Care Management  02/20/2020  Moussa Wiegand [birth year] [id number]   CSW noted that pt was in the ER last night for "detox".  CSW attempted to reach pt today and left a voice message for return call.  CSW also contacted pt's [facility], Victorino Dike, to inquire about the status of the pending inpatient rehab plan that was communicated by pt as plan as of Tuesday of this week.  Per Victorino Dike, they are seeking options for pt, however, they have not located any facility that is in-network, has available beds and does detox with RX.  CSW advised Victorino Dike of pt's ER visit overnight. Victorino Dike has advised CSW she will continue top pursue inpatient options asap for pt.  She also reports she has made contact with pt today and she will update CSW once a plan is approved and finalized.   [name], MSW, LCSW Clinical Social Worker  [facility] [phone]

## 2020-02-20 NOTE — Patient Outreach (Addendum)
[facility]) Care Management  02/20/2020  Zebedee Segundo March 28, 1966 [id number]   CSW noted this morning that pt was admitted to ER overnight for "detox".  CSW attempted to reach pt by phone and left a HIPPA compliant voice message for return call.  Next, CSW made contact with pt's Insurance Care Navigator, Victorino Dike, for update on plan for inpatient placement; which pt had shared with CSW on Tuesday as plan for Wednesday.  Per Victorino Dike, they are continuing to seek placement options with in network facilities and with bed availability.  CSW advised Victorino Dike of pt's ER status.   Victorino Dike provided a follow up call later this afternoon to advise she is currently working to seek options for pt asap.   Victorino Dike plans to update CSW once plans are approved and finalized.   [name], MSW, LCSW Clinical Social Worker  [facility] [phone]

## 2020-02-20 NOTE — Discharge Instructions (Addendum)
Begin taking Librium as prescribed.  Follow-up with outpatient alcohol treatment facilities as provided in this discharge summary.

## 2020-02-21 ENCOUNTER — Other Ambulatory Visit: Payer: Self-pay | Admitting: *Deleted

## 2020-02-21 NOTE — Patient Outreach (Signed)
[facility]) Care Management  02/21/2020  Nicolas [name] 1966-05-14 [id number]   CSW spoke with pt who reports "I started drinking because I was getting jittery".  Pt is quite frustrated with the process of getting insurance approval for inpatient detox. Pt advised CSW;  "they could not get it approved for me to go to [facility] . It's for the rich".  CSW attempted to offer pt support regarding the frustrations with the process; however, pt was not wanting to discuss it further.  "I am going to have to handle this myself like I do everything". CSW validated his frustration and will continue to work on assisting.   CSW made contact with pt's insurance Care Navigator department who reports they continue to seek options for pt. CSW stressed the need for this to be resolved and inpatient detox/rehab facility located and approved for pt to enter.  Per Victorino Dike, in-network options are limited.  CSW asked for updates to be shared once arranged.        [name], MSW, LCSW Clinical Social Worker  [facility] [phone]

## 2020-02-24 ENCOUNTER — Other Ambulatory Visit: Payer: Self-pay | Admitting: *Deleted

## 2020-02-25 NOTE — Patient Outreach (Signed)
[facility]) Care Management  02/25/2020  Gahel Safley December 21, 1965 [id number]   CSW spoke with pt on 02/24/2020 who reports the Insurance CM is still seeking inpatient detox/rehab that is in network and nearby (they do not want pt driving due to his substance abuse).  CSW will follow up with Insurance CM for updates and to assist as able.    [name], MSW, LCSW Clinical Social Worker  [facility] [phone]

## 2020-02-26 ENCOUNTER — Other Ambulatory Visit: Payer: Self-pay | Admitting: *Deleted

## 2020-02-26 NOTE — Patient Outreach (Signed)
[facility]) Care Management  02/26/2020  Arlander Gillen [birth year] [id number]   CSW received a call from Arlan Organ from [facility] rep who oversees behavioral health/care management needs.  Victorino Dike reports that pt will be admitting to the [facility] in [place] and then will transfer to [place] to the [facility] treatment center.  She anticipates he will be there for over a month.  CSW will email local resources for outpatient therapy post inpatient treatment.  CSW will also update [facility] team and PCP of above plans.   [name], MSW, LCSW Clinical Social Worker  [facility] [phone]

## 2020-03-05 ENCOUNTER — Ambulatory Visit: Payer: 59 | Admitting: *Deleted

## 2020-03-08 ENCOUNTER — Other Ambulatory Visit: Payer: Self-pay

## 2020-03-08 ENCOUNTER — Encounter (HOSPITAL_COMMUNITY): Payer: Self-pay | Admitting: Emergency Medicine

## 2020-03-08 DIAGNOSIS — F10239 Alcohol dependence with withdrawal, unspecified: Secondary | ICD-10-CM | POA: Insufficient documentation

## 2020-03-08 DIAGNOSIS — Z5321 Procedure and treatment not carried out due to patient leaving prior to being seen by health care provider: Secondary | ICD-10-CM | POA: Insufficient documentation

## 2020-03-08 NOTE — ED Triage Notes (Addendum)
Pt reports that he wants alcohol detox. States taht he was in a detox facility and then started drinking again 4 days ago. States that he feels bloated and hopeless. Last drank around noon today.

## 2020-03-09 ENCOUNTER — Emergency Department (HOSPITAL_COMMUNITY)
Admission: EM | Admit: 2020-03-09 | Discharge: 2020-03-09 | Disposition: A | Discharge status: Home / Self Care | Payer: Medicaid Other | Attending: Emergency Medicine | Admitting: Emergency Medicine

## 2020-03-09 NOTE — ED Notes (Signed)
Pt returned his stickers to registration and LWBS

## 2020-03-16 ENCOUNTER — Other Ambulatory Visit: Payer: Self-pay | Admitting: *Deleted

## 2020-03-16 NOTE — Patient Outreach (Signed)
[facility]) Care Management  03/16/2020  Nicolas [name] [birth year] [id number]   Telephone Assessment-Case Closure  RN attempted another outreach call however unsuccessful. Epic not indicates pt has been admitted into a detox inpt facility.   Case will be closed as pt no longer meets criteria for [facility] services at this time. Provider will be notified.  Elliot Cousin, RN Care Management Coordinator [facility] Main Office [phone]

## 2020-03-19 ENCOUNTER — Other Ambulatory Visit: Payer: Self-pay | Admitting: *Deleted

## 2020-03-21 NOTE — Patient Outreach (Signed)
[facility]) Care Management  03/21/2020   LATE ENTRY  Nicolas [name] 1966-03-04 [id number]   CSW spoke with pt by phone who reports he is drinking again. Pt reports leaving the detox center "because I needed to work".  Pt is now admitting to drinking again, denies SI/HI.   CSW had a frank conversation about him committing to detox/rehab and seeing it through.    He is now agreeable to going to detox, again, and is wanting help with pursuing options.  CSW contacted his CM through his [employer] who is working with pt to seek options for him asap.   [name], MSW, LCSW Clinical Social Worker  [facility] [phone]

## 2020-03-23 ENCOUNTER — Ambulatory Visit: Payer: Self-pay | Admitting: *Deleted

## 2020-03-24 ENCOUNTER — Other Ambulatory Visit: Payer: Self-pay | Admitting: *Deleted

## 2020-03-24 NOTE — Patient Outreach (Signed)
[facility]) Care Management  03/24/2020  Nicolas [name] 12/28/1965 [id number]   CSW attempted to reach pt on 03/23/2020 for updates.  CSW was unable to reach pt and left a HIPPA compliant voice message.  CSW is anticipating a follow up call from pt's [facility]/CM who is working to coordinate pt's needs as well.   CSW will await callback from pt or try again in 3-4 business days per policy.  [name], MSW, LCSW Clinical Social Worker  [facility] [phone]

## 2020-03-26 ENCOUNTER — Ambulatory Visit: Payer: Self-pay | Admitting: *Deleted

## 2020-03-27 ENCOUNTER — Other Ambulatory Visit: Payer: Self-pay | Admitting: *Deleted

## 2020-03-27 NOTE — Patient Outreach (Signed)
[facility]) Care Management  03/27/2020  Nicolas [name] [birth year] [id number]   CSW attempted to reach pt for updates and was unsuccessful.  CSW was able to leave a HIPPA compliant voice message.  Pt was discussed with collaborative group to include [facility] multi-disciplinary team and [facility] rep.  CSW will attempt to communicate with pt's PCP about relative concerns related to pt's etoh use and recent non-compliancy, ER visits and leaving detox AMA.   [name], MSW, LCSW Clinical Social Worker  [facility] [phone]

## 2020-03-31 ENCOUNTER — Other Ambulatory Visit: Payer: Self-pay | Admitting: *Deleted

## 2020-03-31 NOTE — Patient Outreach (Signed)
[facility]) Care Management  03/31/2020  Nicolas [name] 29-Sep-1965 [id number]   CSW attempted outreach to pt on 03/26/2020 and left a HIPPA compliant voice message.  CSW will attempt again in 3-4 business days per policy.   [name], MSW, LCSW Clinical Social Worker  [facility] [phone]

## 2020-03-31 NOTE — Patient Outreach (Signed)
[facility]) Care Management  03/31/2020  Nicolas [name] [birth year] [id number]  CSW spoke with pt today by phone. Pt reports he has just gotten out of detox again and states he is feeling more optimistic about abstaining from drinking.  "I want to live, I want to not drink and end up feeling bad and then have to go through withdrawal again".  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  "It was a gift of desperation; God".  CSW praised pt for his efforts and offered encouragement.  Pt is hoping to get linked with a counselor who specializes in transgender. Per pt, his [facility] rep is looking into this.  CSW will also plan to assist and communicate with [facility] rep to collaborate and assist.   Pt also shared with CSW that he is going to see his PCP tomorrow.   CSW will plan a follow up call to pt later this week.   [name], MSW, LCSW Clinical Social Worker  [facility] [phone]

## 2020-04-02 ENCOUNTER — Other Ambulatory Visit: Payer: Self-pay | Admitting: *Deleted

## 2020-04-03 NOTE — Patient Outreach (Signed)
[facility]) Care Management  04/03/2020  Nicolas [name] 02-29-1966 [id number]   CSW spoke with pt who reports he is doing well.  He has been staying busy, paying bills, going to [employer] and will be traveling to [place] next week for work. Pt reflected on good it feels to be taking care of things (bills, home, work, etc) and not drinking.  CSW offered support and encouragement to pt. Pt requesting follow up call after his return from [place],  CSW will outreach [facility] rep for updates on their search for a psychotherapist.   Pt appreciative of support and "for not giving up on me".   [name], MSW, LCSW Clinical Social Worker  [facility] [phone]

## 2020-04-14 ENCOUNTER — Other Ambulatory Visit: Payer: Self-pay | Admitting: *Deleted

## 2020-04-15 NOTE — Patient Outreach (Signed)
[facility]) Care Management  04/15/2020  Nicolas [name] 05/02/1966 [id number]   CSW spoke briefly with pt on 04/14/2020 and again on 04/15/2020.  Pt reported plans to be driving back to [place] today however had not left yet and requested to call CSW back later.   CSW will await callback or outreach again later this week.   [name], MSW, LCSW Clinical Social Worker  [facility] [phone]

## 2020-04-17 ENCOUNTER — Ambulatory Visit: Payer: Self-pay | Admitting: *Deleted

## 2020-04-18 ENCOUNTER — Other Ambulatory Visit: Payer: Self-pay | Admitting: *Deleted

## 2020-04-18 NOTE — Patient Outreach (Signed)
[facility]) Care Management  04/18/2020  Arif Amendola 03/14/1966 [id number]    CSW attempted to reach pt by phone on 11/05 and was unable.  CSW will attempt outreach again next week.   [name], MSW, LCSW Clinical Social Worker  [facility] [phone]

## 2020-04-21 ENCOUNTER — Other Ambulatory Visit: Payer: Self-pay | Admitting: *Deleted

## 2020-04-21 ENCOUNTER — Encounter (HOSPITAL_COMMUNITY): Payer: Self-pay

## 2020-04-21 ENCOUNTER — Other Ambulatory Visit: Payer: Self-pay

## 2020-04-21 ENCOUNTER — Emergency Department (HOSPITAL_COMMUNITY)
Admission: EM | Admit: 2020-04-21 | Discharge: 2020-04-22 | Disposition: A | Discharge status: Home / Self Care | Payer: 59 | Attending: Emergency Medicine | Admitting: Emergency Medicine

## 2020-04-21 DIAGNOSIS — R45851 Suicidal ideations: Secondary | ICD-10-CM | POA: Insufficient documentation

## 2020-04-21 DIAGNOSIS — Z79899 Other long term (current) drug therapy: Secondary | ICD-10-CM | POA: Insufficient documentation

## 2020-04-21 DIAGNOSIS — Z20822 Contact with and (suspected) exposure to covid-19: Secondary | ICD-10-CM | POA: Insufficient documentation

## 2020-04-21 DIAGNOSIS — I10 Essential (primary) hypertension: Secondary | ICD-10-CM | POA: Insufficient documentation

## 2020-04-21 DIAGNOSIS — F10239 Alcohol dependence with withdrawal, unspecified: Secondary | ICD-10-CM | POA: Diagnosis present

## 2020-04-21 DIAGNOSIS — F17210 Nicotine dependence, cigarettes, uncomplicated: Secondary | ICD-10-CM | POA: Diagnosis not present

## 2020-04-21 LAB — RAPID URINE DRUG SCREEN, HOSP PERFORMED
Amphetamines: NOT DETECTED
Barbiturates: NOT DETECTED
Benzodiazepines: POSITIVE — AB
Cocaine: NOT DETECTED
Opiates: NOT DETECTED
Tetrahydrocannabinol: NOT DETECTED

## 2020-04-21 LAB — COMPREHENSIVE METABOLIC PANEL
ALT: 118 U/L — ABNORMAL HIGH (ref 0–44)
AST: 141 U/L — ABNORMAL HIGH (ref 15–41)
Albumin: 4.6 g/dL (ref 3.5–5.0)
Alkaline Phosphatase: 56 U/L (ref 38–126)
Anion gap: 13 (ref 5–15)
BUN: <5 mg/dL — ABNORMAL LOW (ref 6–20)
CO2: 27 mmol/L (ref 22–32)
Calcium: 9.3 mg/dL (ref 8.9–10.3)
Chloride: 101 mmol/L (ref 98–111)
Creatinine, Ser: 0.82 mg/dL (ref 0.61–1.24)
GFR, Estimated: >60 mL/min (ref >60)
Glucose, Bld: 111 mg/dL — ABNORMAL HIGH (ref 70–99)
Potassium: 4 mmol/L (ref 3.5–5.1)
Sodium: 141 mmol/L (ref 135–145)
Total Bilirubin: 0.6 mg/dL (ref 0.3–1.2)
Total Protein: 7.4 g/dL (ref 6.5–8.1)

## 2020-04-21 LAB — LIPASE, BLOOD: Lipase: 38 U/L (ref 11–51)

## 2020-04-21 LAB — URINALYSIS, ROUTINE W REFLEX MICROSCOPIC
Bilirubin Urine: NEGATIVE
Glucose, UA: NEGATIVE mg/dL
Hgb urine dipstick: NEGATIVE
Ketones, ur: NEGATIVE mg/dL
Leukocytes,Ua: NEGATIVE
Nitrite: NEGATIVE
Protein, ur: NEGATIVE mg/dL
Specific Gravity, Urine: 1.011 (ref 1.005–1.030)
pH: 5 (ref 5.0–8.0)

## 2020-04-21 LAB — RESPIRATORY PANEL BY RT PCR (FLU A&B, COVID)
Influenza A by PCR: NEGATIVE
Influenza B by PCR: NEGATIVE
SARS Coronavirus 2 by RT PCR: NEGATIVE

## 2020-04-21 LAB — CBC
HCT: 44.9 % (ref 39.0–52.0)
Hemoglobin: 15.2 g/dL (ref 13.0–17.0)
MCH: 35.3 pg — ABNORMAL HIGH (ref 26.0–34.0)
MCHC: 33.9 g/dL (ref 30.0–36.0)
MCV: 104.2 fL — ABNORMAL HIGH (ref 80.0–100.0)
Platelets: 218 10*3/uL (ref 150–400)
RBC: 4.31 MIL/uL (ref 4.22–5.81)
RDW: 12.6 % (ref 11.5–15.5)
WBC: 4.9 10*3/uL (ref 4.0–10.5)
nRBC: 0 % (ref 0.0–0.2)

## 2020-04-21 LAB — ETHANOL: Alcohol, Ethyl (B): 99 mg/dL — ABNORMAL HIGH (ref <10)

## 2020-04-21 MED ORDER — ATENOLOL 25 MG PO TABS
25.0000 mg | ORAL_TABLET | Freq: Every day | ORAL | Status: DC
  Administered 2020-04-21: 25 mg via ORAL
  Filled 2020-04-21 (×2): qty 1

## 2020-04-21 MED ORDER — IBUPROFEN 200 MG PO TABS
600.0000 mg | ORAL_TABLET | Freq: Three times a day (TID) | ORAL | Status: DC | PRN
  Administered 2020-04-21: 600 mg via ORAL
  Filled 2020-04-21: qty 3

## 2020-04-21 MED ORDER — THIAMINE HCL 100 MG/ML IJ SOLN: 100.0000 mg | Freq: Every day | INTRAMUSCULAR | Status: DC

## 2020-04-21 MED ORDER — LORAZEPAM 1 MG PO TABS: 0.0000 mg | ORAL_TABLET | Freq: Two times a day (BID) | ORAL | Status: DC

## 2020-04-21 MED ORDER — THIAMINE HCL 100 MG PO TABS
100.0000 mg | ORAL_TABLET | Freq: Every day | ORAL | Status: DC
  Administered 2020-04-21: 100 mg via ORAL
  Filled 2020-04-21: qty 1

## 2020-04-21 MED ORDER — FAMOTIDINE 20 MG PO TABS
20.0000 mg | ORAL_TABLET | Freq: Two times a day (BID) | ORAL | Status: DC
  Administered 2020-04-21: 20 mg via ORAL
  Filled 2020-04-21: qty 1

## 2020-04-21 MED ORDER — ADULT MULTIVITAMIN W/MINERALS CH
1.0000 | ORAL_TABLET | Freq: Every day | ORAL | Status: DC
  Administered 2020-04-21: 1 via ORAL
  Filled 2020-04-21: qty 1

## 2020-04-21 MED ORDER — LORAZEPAM 1 MG PO TABS
0.0000 mg | ORAL_TABLET | Freq: Four times a day (QID) | ORAL | Status: DC
  Administered 2020-04-22: 2 mg via ORAL
  Filled 2020-04-21: qty 2

## 2020-04-21 MED ORDER — LORAZEPAM 2 MG/ML IJ SOLN: 0.0000 mg | Freq: Two times a day (BID) | INTRAMUSCULAR | Status: DC

## 2020-04-21 MED ORDER — NICOTINE 21 MG/24HR TD PT24
21.0000 mg | MEDICATED_PATCH | Freq: Every day | TRANSDERMAL | Status: DC
  Administered 2020-04-21: 21 mg via TRANSDERMAL
  Filled 2020-04-21: qty 1

## 2020-04-21 MED ORDER — LORAZEPAM 2 MG/ML IJ SOLN
0.0000 mg | Freq: Four times a day (QID) | INTRAMUSCULAR | Status: DC
  Administered 2020-04-21: 1 mg via INTRAVENOUS
  Filled 2020-04-21: qty 1

## 2020-04-21 MED ORDER — ONDANSETRON HCL 4 MG PO TABS: 4.0000 mg | ORAL_TABLET | Freq: Three times a day (TID) | ORAL | Status: DC | PRN

## 2020-04-21 MED ORDER — ALUM & MAG HYDROXIDE-SIMETH 200-200-20 MG/5ML PO SUSP: 30.0000 mL | Freq: Four times a day (QID) | ORAL | Status: DC | PRN

## 2020-04-21 MED ORDER — SPIRONOLACTONE 25 MG PO TABS
50.0000 mg | ORAL_TABLET | Freq: Two times a day (BID) | ORAL | Status: DC
  Administered 2020-04-21: 50 mg via ORAL
  Filled 2020-04-21 (×2): qty 2

## 2020-04-21 NOTE — ED Triage Notes (Addendum)
Pt BIB EMS from home. Pt reports going to PCP today and has been informed that he has been accepted at a rehab facility tomorrow. Pt reports last drink an hours ago. Pt is worried about developing alcohol withdrawal symptoms. Pt also endorses RUQ pain and nausea.

## 2020-04-21 NOTE — ED Notes (Signed)
Pt. Removed saline lock. RN, Aggie Cosier made aware.

## 2020-04-21 NOTE — BHH Counselor (Signed)
Francis Gaines, NS to fax pt's IVC paperwork. Once IVC paperwork is received clinician to engage the pt in TTS assessment.    Redmond Pulling, MS, [facility], [facility] Triage Specialist [phone]

## 2020-04-21 NOTE — ED Notes (Signed)
EDP at bedside  

## 2020-04-21 NOTE — [facility] Counselor (Signed)
Clinician spoke to Francis Gaines, NS to express she has not received the pt's IVC paperwork. NS to refax IVC paperwork. Clinician expressed she will check back if needed.    Redmond Pulling, MS, [facility], [facility] Triage Specialist [phone]

## 2020-04-21 NOTE — ED Provider Notes (Signed)
[facility]-EMERGENCY DEPT Provider Note   CSN: [id number] Arrival date & time: 04/21/20  1724     History Chief Complaint  Patient presents with  . Withdrawal  . Abdominal Pain    Nicolas [name] is a 54 y.o. adult.  54 year old male presents under IVC due to suicidal ideations with plan to drink himself to death.  Patient has a history of alcohol abuse and states that he had 3 weeks of sobriety which ended when he went to [facility] and consume alcohol use cocaine.  He denies any active SI or HI at this time.  Denies any vomiting but some nausea.  Denies any abdominal discomfort.  Last drink was earlier today.  He does endorse some tremors.  But denies any tactile hallucinations        Past Medical History:  Diagnosis Date  . Alcohol abuse   . Hypertension   . Pancreatitis     Patient Active Problem List   Diagnosis Date Noted  . GERD (gastroesophageal reflux disease) 11/03/2019  . Alcoholic hepatitis 11/02/2019  . Major depressive disorder, recurrent episode, severe (HCC) 10/21/2019  . 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 Acute pancreatitis 10/20/2019  . Alcohol use disorder, severe, dependence (HCC) 10/16/2019    Past Surgical History:  Procedure Laterality Date  . NASAL SEPTUM SURGERY         History reviewed. No pertinent family history.  Social History   Tobacco Use  . Smoking status: Current Every Day Smoker    Packs/day: 0.50    Types: Cigarettes  . Smokeless tobacco: Never Used  Vaping Use  . Vaping Use: Never used  Substance Use Topics  . Alcohol use: Yes    Alcohol/week: 6.0 standard drinks    Types: 6 Cans of beer per week    Comment: daily use  . Drug use: Not Currently    Types: Cocaine    Comment: stopped 01/2019    Home Medications Prior to Admission medications   Medication Sig Start Date End Date Taking? Authorizing Provider  atenolol (TENORMIN) 25 MG tablet Take 25 mg by mouth daily. 08/31/19   [provider]  chlordiazePOXIDE (LIBRIUM) 25 MG capsule 50mg  PO TID x 1D, then 25-50mg  PO BID X 1D, then 25-50mg  PO QD X 1D 02/20/20   04/21/20, MD  famotidine (PEPCID) 20 MG tablet Take 1 tablet (20 mg total) by mouth 2 (two) times daily. 01/26/20   Little, 01/28/20, MD  fenofibrate 160 MG tablet Take by mouth. 11/22/19   [provider]  hydrOXYzine (ATARAX/VISTARIL) 25 MG tablet Take 25 mg by mouth at bedtime as needed for anxiety (sleep).  Patient not taking: Reported on 11/29/2019 06/27/19   [provider]  Multiple Vitamin (MULTIVITAMIN WITH MINERALS) TABS tablet Take 1 tablet by mouth daily.    [provider]  ondansetron (ZOFRAN ODT) 4 MG disintegrating tablet Take 1 tablet (4 mg total) by mouth every 8 (eight) hours as needed for nausea or vomiting. 01/26/20   Little, 01/28/20, MD  pantoprazole (PROTONIX) 20 MG tablet Take 1 tablet (20 mg total) by mouth daily. Patient not taking: Reported on 01/26/2020 10/31/19 12/30/19  01/01/20, PA-C  spironolactone (ALDACTONE) 50 MG tablet Take 50 mg by mouth 2 (two) times daily. 08/22/19   [provider]  sucralfate (CARAFATE) 1 g tablet Take 1 tablet (1 g total) by mouth 4 (four) times daily -  with meals and at bedtime for 14 days. Patient taking  differently: Take 1 g by mouth daily.  10/31/19 01/26/20  [name], Ines Bloomer C, PA-C    Allergies    Bean pod extract and Caffeine  Review of Systems   Review of Systems  All other systems reviewed and are negative.   Physical Exam Updated Vital Signs BP (!) 132/94 (BP Location: Left Arm)   Pulse 83   Temp 98.7 F (37.1 C) (Oral)   Resp 16   Ht 1.765 m (5' 9.5")   Wt 86.2 kg   SpO2 96%   BMI 27.66 kg/m   Physical Exam Vitals and nursing note reviewed.  Constitutional:      General: Nicolas [name] is not in acute distress.    Appearance: Normal appearance. Nicolas [name] is well-developed. Nicolas [name] is not toxic-appearing.  HENT:     Head:  Normocephalic and atraumatic.  Eyes:     General: Lids are normal.     Conjunctiva/sclera: Conjunctivae normal.     Pupils: Pupils are equal, round, and reactive to light.  Neck:     Thyroid: No thyroid mass.     Trachea: No tracheal deviation.  Cardiovascular:     Rate and Rhythm: Normal rate and regular rhythm.     Heart sounds: Normal heart sounds. No murmur heard.  No gallop.   Pulmonary:     Effort: Pulmonary effort is normal. No respiratory distress.     Breath sounds: Normal breath sounds. No stridor. No decreased breath sounds, wheezing, rhonchi or rales.  Abdominal:     General: Bowel sounds are normal. There is no distension.     Palpations: Abdomen is soft.     Tenderness: There is no abdominal tenderness. There is no rebound.  Musculoskeletal:        General: No tenderness. Normal range of motion.     Cervical back: Normal range of motion and neck supple.  Skin:    General: Skin is warm and dry.     Findings: No abrasion or rash.  Neurological:     Mental Status: Nicolas [name] is alert and oriented to person, place, and time.     GCS: GCS eye subscore is 4. GCS verbal subscore is 5. GCS motor subscore is 6.     Cranial Nerves: No cranial nerve deficit.     Sensory: No sensory deficit.  Psychiatric:        Attention and Perception: Attention normal.        Mood and Affect: Mood is elated.        Speech: Speech normal.        Behavior: Behavior normal.        Thought Content: Thought content does not include suicidal ideation. Thought content does not include suicidal plan.     ED Results / Procedures / Treatments   Labs (all labs ordered are listed, but only abnormal results are displayed) Labs Reviewed  CBC - Abnormal; Notable for the following components:      Result Value   MCV 104.2 (*)    MCH 35.3 (*)    All other components within normal limits  RESPIRATORY PANEL BY RT PCR (FLU A&B, COVID)  URINALYSIS, ROUTINE W REFLEX MICROSCOPIC  LIPASE, BLOOD    COMPREHENSIVE METABOLIC PANEL    EKG None  Radiology No results found.  Procedures Procedures (including critical care time)  Medications Ordered in ED Medications  LORazepam (ATIVAN) injection 0-4 mg (has no administration in time range)    Or  LORazepam (ATIVAN) tablet 0-4 mg (  has no administration in time range)  LORazepam (ATIVAN) injection 0-4 mg (has no administration in time range)    Or  LORazepam (ATIVAN) tablet 0-4 mg (has no administration in time range)  thiamine tablet 100 mg (has no administration in time range)    Or  thiamine (B-1) injection 100 mg (has no administration in time range)  alum & mag hydroxide-simeth (MAALOX/MYLANTA) 200-200-20 MG/5ML suspension 30 mL (has no administration in time range)  nicotine (NICODERM CQ - dosed in mg/24 hours) patch 21 mg (has no administration in time range)  ibuprofen (ADVIL) tablet 600 mg (has no administration in time range)  ondansetron (ZOFRAN) tablet 4 mg (has no administration in time range)  famotidine (PEPCID) tablet 20 mg (has no administration in time range)  atenolol (TENORMIN) tablet 25 mg (has no administration in time range)  multivitamin with minerals tablet 1 tablet (has no administration in time range)  spironolactone (ALDACTONE) tablet 50 mg (has no administration in time range)    ED Course  I have reviewed the triage vital signs and the nursing notes.  Pertinent labs & imaging results that were available during my care of the patient were reviewed by me and considered in my medical decision making (see chart for details).    MDM Rules/Calculators/A&P                          Patient started on alcohol withdrawal protocol here.  He is medically cleared for psychiatric referral Final Clinical Impression(s) / ED Diagnoses Final diagnoses:  None    Rx / DC Orders ED Discharge Orders    None       Lorre Nick, MD 04/21/20 1901

## 2020-04-21 NOTE — ED Notes (Signed)
ED Provider at bedside. 

## 2020-04-22 ENCOUNTER — Other Ambulatory Visit: Payer: Self-pay | Admitting: *Deleted

## 2020-04-22 ENCOUNTER — Encounter (HOSPITAL_COMMUNITY): Payer: Self-pay | Admitting: Registered Nurse

## 2020-04-22 NOTE — ED Provider Notes (Addendum)
Emergency Medicine Observation Re-evaluation Note  Nicolas [name] is a 54 y.o. adult, seen on rounds today.  Pt initially presented to the ED for complaints of Withdrawal and Abdominal Pain Currently, the patient is resting comfortably in bed, no distress.  Physical Exam  BP 131/84   Pulse 63   Temp 97.7 F (36.5 C) (Oral)   Resp 18   Ht 5' 9.5" (1.765 m)   Wt 86.2 kg   SpO2 96%   BMI 27.66 kg/m  Physical Exam General: resting comfortably, no distress Cardiac: warm and well perfused Lungs: even unlabored Psych: calm  ED Course / MDM  EKG:    I have reviewed the labs performed to date as well as medications administered while in observation.  Recent changes in the last 24 hours include TTS has evaluated patient, obs overnight .  Plan  Current plan is for reassessment by psych, likely go to detox this am. Patient is under full IVC at this time.   Nicolas Loll, MD 04/22/20 [phone]   ADDENDUM - notified by psych NP patient has been psych cleared, he is planning to go to detox program later this morning.    Nicolas Loll, MD 04/22/20 [phone]

## 2020-04-22 NOTE — ED Notes (Signed)
Pitcher of ice water has been provided.

## 2020-04-22 NOTE — ED Notes (Signed)
Patient ambulatory to and from the bathroom with a steady gait.  

## 2020-04-22 NOTE — Discharge Instructions (Signed)
Go to your detox program

## 2020-04-22 NOTE — Consult Note (Signed)
Nicolas [name], 54 y.o., adult patient seen via tele psych by this provider, consulted with Dr. Lucianne Muss; and chart reviewed on 04/22/20.  On evaluation [name] reports "Usually after drinking alcohol I feel bad.  I went to my doctors office and said some stupid stuff like wanting to kill myself.  I don't really want to kill myself or die. I just want to get off of alcohol and get cleaned up.  When you drink alcohol you want to stop but you drink more to keep from going int to withdrawal and the cycle just repeats it self.  You come to the hospital and get Ativan but it really doesn't help anything.  I need to get detox and get it out of my system and stay clean."  Patient states that he is employed and lives alone but has a good support system.  Patient states that he doesn't currently have outpatient psychiatric services but has a case worker that is in the process of setting up.  Patient states that he is suppose to go to [facility] today and that they are suppose to pick him up at his home at 11:30 AM today.  "I just need to get home in time to pack so I'll be ready when they come to pick me up."  Patient states he is feeling much better.  During evaluation Cortlin Marano is alert/oriented x 4; calm/cooperative; and mood is congruent with affect.  He does not appear to be responding to internal/external stimuli or delusional thoughts.  Patient denies suicidal/self-harm/homicidal ideation, psychosis, and paranoia.  Patient answered question appropriately.    Disposition:  Psychiatrically cleared No evidence of imminent risk to self or others at present.   Patient does not meet criteria for psychiatric inpatient admission. Supportive therapy provided about ongoing stressors. Discussed crisis plan, support from social network, calling 911, coming to the Emergency Department, and calling Suicide Hotline.

## 2020-04-22 NOTE — ED Notes (Signed)
Patient received personal belongings back at this time.

## 2020-04-22 NOTE — ED Notes (Signed)
Patient given bus pass to get home.

## 2020-04-22 NOTE — Progress Notes (Signed)
TOC CM/CSW answered consult for transportation.  Pt gave CSW verbal permission to sign "Therapist, nutritional", due to timed situation.  CSW contacted [employer].  CSW faxed  "Therapist, nutritional".  [facility] picked up pt for safe transport home.  CSW will continue to follow for dc needs.  Deborh Pense Tarpley-Carter, MSW, LCSW-A Pronouns:  She, Her, Hers                  [facility] ED Transitions of CareClinical Social Worker [email or web address] [phone]

## 2020-04-22 NOTE — BH Assessment (Signed)
[facility] Assessment Progress Note  Per Nelly Rout, MD, this pt does not require psychiatric hospitalization at this time.  Pt presents under IVC initiated by a Transport planner and upheld by EDP Lorre Nick, MD, which has been rescinded by EDP Marianna Fuss, MD.  Pt is psychiatrically cleared.  Pt was discharged before this writer could enter discharge instructions in AVS.  Doylene Canning, MA Triage Specialist [phone]

## 2020-04-22 NOTE — ED Notes (Signed)
TS consult in progress.

## 2020-04-22 NOTE — BH Assessment (Signed)
Tele Assessment Note   Patient Name: Nicolas [name] MRN: [id number] Referring Physician: Dr. Lorre Nick.  Location of Patient: [facility], [phone]. Location of Provider: Behavioral Health TTS Department  Nicolas [name] is an 54 y.o. adult, who presents voluntary and unaccompanied to [facility]. Clinician asked the pt, "what brought you to the hospital?" Pt reported, his doctors office called the police so he can get help. Pt reported, three police officers and someone with the [facility] came to his house. Pt reported, the behavioral health worker talked to his case manager and with [facility]. Pt reported, the plan was for him to go to [facility] in the morning but the officers recommended the pt come to the hospital due withdrawals. Pt reported, when he's drunk he become suicidal. Pt denies, SI, HI, AVH, self-injurious behaviors and access to weapons.   Pt was IVC'd by [employer]. Per IVC paperwork: "Respondent is suicidal and has a plan, respondent is a chronic alcoholic and has stated that he wants to drink himself to death."   Pt reported drinking eight beers. Pt's BAL was 99 at 1752 and UDS was positive for benzodiazepines. Pt reported, he's currently experiencing withdrawal symptoms. Pt denies, being linked to OPT resources (medication management and/or counseling.) Pt reported, he was in the hospital in [place] twice (on 04/13/2020 and 04/16/2020) for alcohol use.   Pt presents alert in scrubs with logical, coherent speech. Pt's eye contact was fair. Pt's mood was pleasant, sad. Pt's affect was congruent with mood. Pt's thought process was coherent, relevant. Pt's judgement was partial. Pt was oriented x4. Pt's concentration was normal. Pt's insight was fair. Pt's impulse control was poor.   Diagnosis: Alcohol use disorder, severe.  Past Medical History:  Past Medical History:  Diagnosis Date  . Alcohol abuse   . Hypertension   . Pancreatitis      Past Surgical History:  Procedure Laterality Date  . NASAL SEPTUM SURGERY      Family History: History reviewed. No pertinent family history.  Social History:  reports that Nicolas [name] has been smoking cigarettes. Nicolas [name] has been smoking about 0.50 packs per day. Nicolas [name] has never used smokeless tobacco. Nicolas [name] reports current alcohol use of about 6.0 standard drinks of alcohol per week. Nicolas [name] reports previous drug use. Drug: Cocaine.  Additional Social History:  Alcohol / Drug Use Pain Medications: See MAR Prescriptions: See MAR Over the Counter: See MAR History of alcohol / drug use?: Yes Longest period of sobriety (when/how long): Pt reported, 85-90 days of sobrtiety. . Withdrawal Symptoms: Sweats, Tremors, Fever / Chills, Irritability, Nausea / Vomiting, Cramps Substance #1 Name of Substance 1: Alcohol. 1 - Age of First Use: 18. 1 - Amount (size/oz): Pt reported, drinking 8 beers. 1 - Frequency: Everyday. 1 - Duration: Ongoing. 1 - Last Use / Amount: 04/21/2020.  CIWA: CIWA-Ar BP: (!) 144/99 Pulse Rate: 61 Nausea and Vomiting: no nausea and no vomiting Tactile Disturbances: none Tremor: not visible, but can be felt fingertip to fingertip Auditory Disturbances: not present Paroxysmal Sweats: no sweat visible Visual Disturbances: not present Anxiety: mildly anxious Headache, Fullness in Head: none present Agitation: normal activity Orientation and Clouding of Sensorium: oriented and can do serial additions CIWA-Ar Total: 2 COWS:    Allergies:  Allergies  Allergen Reactions  . Caffeine Palpitations and Anaphylaxis    Other reaction(s): Heart rhythm/rate changes Heart beat super fast and bean enzymes    . Bean Pod Extract  Palpitations    Home Medications: (Not in a hospital admission)   OB/GYN Status:  No LMP recorded.  General Assessment Data Location of Assessment: [facility] TTS Assessment: In system Is this a Tele or  Face-to-Face Assessment?: Tele Assessment Is this an Initial Assessment or a Re-assessment for this encounter?: Initial Assessment Patient Accompanied by:: N/A Language Other than English: No Living Arrangements: Other (Comment) (Alone.) What gender do you identify as?: Male Date Telepsych consult ordered in [facility]: 04/22/20 Time Telepsych consult ordered in [facility]: 1856 Marital status: Single Living Arrangements: Alone Can pt return to current living arrangement?: Yes Admission Status: Voluntary Is patient capable of signing voluntary admission?: Yes Referral Source: Other ([employer]. ) Insurance type: [employer].     Crisis Care Plan Living Arrangements: Alone Legal Guardian: Other: (Self. ) Name of Psychiatrist: None. Name of Therapist: None.  Education Status Is patient currently in school?: No Is the patient employed, unemployed or receiving disability?: Employed  Risk to self with the past 6 months Suicidal Ideation: No-Not Currently/Within Last 6 Months Has patient been a risk to self within the past 6 months prior to admission? : Yes Suicidal Intent: No-Not Currently/Within Last 6 Months Has patient had any suicidal intent within the past 6 months prior to admission? : Yes Suicidal Plan?: No-Not Currently/Within Last 6 Months Has patient had any suicidal plan within the past 6 months prior to admission? : Yes Access to Means: Yes Specify Access to Suicidal Means: Alcohol.  What has been your use of drugs/alcohol within the last 12 months?: Alcohol and benzodiazepines. Previous Attempts/Gestures: No How many times?: 0 Other Self Harm Risks: Alcohol abuse.  Triggers for Past Attempts: None known Intentional Self Injurious Behavior: None (Pt denies. ) Recent stressful life event(s): Other (Comment) (Alcoholism.) Persecutory voices/beliefs?: No Depression: Yes Depression Symptoms: Feeling worthless/self pity, Guilt, Tearfulness, Insomnia, Despondent (Frustration. ) Substance  abuse history and/or treatment for substance abuse?: Yes Suicide prevention information given to non-admitted patients: Not applicable  Risk to Others within the past 6 months Homicidal Ideation: No (Pt denies. ) Does patient have any lifetime risk of violence toward others beyond the six months prior to admission? : No Thoughts of Harm to Others: No Current Homicidal Intent: No Current Homicidal Plan: No Access to Homicidal Means: No Identified Victim: NA History of harm to others?: No Assessment of Violence: None Noted Violent Behavior Description: NA Does patient have access to weapons?: No (Pt denies. ) Criminal Charges Pending?: No Does patient have a court date: No Is patient on probation?: No  Psychosis Hallucinations: None noted Delusions: None noted  Mental Status Report Appearance/Hygiene: In scrubs Eye Contact: Fair Motor Activity: Unremarkable Speech: Logical/coherent Level of Consciousness: Alert Anxiety Level: Moderate Thought Processes: Coherent, Relevant Judgement: Partial Orientation: Person, Place, Time, Situation Obsessive Compulsive Thoughts/Behaviors: None  Cognitive Functioning Concentration: Normal Memory: Recent Intact Is patient IDD: No Insight: Fair Impulse Control: Fair Appetite: Good Sleep: Decreased Total Hours of Sleep:  (Per pt in fragments (every 15-20 mintues) ) Vegetative Symptoms: None  ADLScreening [facility] Assessment Services) Patient's cognitive ability adequate to safely complete daily activities?: Yes Patient able to express need for assistance with ADLs?: Yes Independently performs ADLs?: Yes (appropriate for developmental age)     Prior Outpatient Therapy Prior Outpatient Therapy: No Does patient have an ACCT team?: No Does patient have Intensive In-House Services?  : No Does patient have [facility] services? : No Does patient have P4CC services?: No  ADL Screening (condition at time of admission)  Patient's cognitive  ability adequate to safely complete daily activities?: Yes Is the patient deaf or have difficulty hearing?: No Does the patient have difficulty seeing, even when wearing glasses/contacts?: No Does the patient have difficulty concentrating, remembering, or making decisions?: No Patient able to express need for assistance with ADLs?: Yes Does the patient have difficulty dressing or bathing?: No Independently performs ADLs?: Yes (appropriate for developmental age) Does the patient have difficulty walking or climbing stairs?: No Weakness of Legs: None Weakness of Arms/Hands: None  Home Assistive Devices/Equipment Home Assistive Devices/Equipment: Other (Comment) (Pt reported, reading glasses.)    Abuse/Neglect Assessment (Assessment to be complete while patient is alone) Abuse/Neglect Assessment Can Be Completed: Yes Physical Abuse: Denies Verbal Abuse: Yes, past (Comment) (Pt reported, experiencing verbal, mental and emotional abuse.) Sexual Abuse: Denies Exploitation of patient/patient's resources: Denies Self-Neglect: Denies     Merchant navy officer (For Healthcare) Does Patient Have a Medical Advance Directive?: No Would patient like information on creating a medical advance directive?: No - Patient declined    Disposition: Otila Back, PA recommends pt to be observed and reassessed by psychiatry. Pt is scheduled to go to [facility] in the morning. Disposition discussed with Dr. Nicanor Alcon and Aggie Cosier, RN.   Disposition Initial Assessment Completed for this Encounter: Yes  This service was provided via telemedicine using a 2-way, interactive audio and video technology.  Names of all persons participating in this telemedicine service and their role in this encounter. Name: Sena Clouatre. Role: Patient.  Name: Redmond Pulling, MS, [facility], CRC. Role: Counselor.          Redmond Pulling 04/22/2020 12:58 AM     Redmond Pulling, MS, [facility], CRC Triage  Specialist [phone]

## 2020-04-22 NOTE — ED Notes (Signed)
Patient will be re-evaluated in the morning.

## 2020-04-22 NOTE — ED Notes (Signed)
Patient is resting comfortably. 

## 2020-04-23 ENCOUNTER — Ambulatory Visit: Payer: Self-pay | Admitting: *Deleted

## 2020-04-23 NOTE — Patient Outreach (Signed)
[facility]) Care Management  04/23/2020  Nicolas [name] Jan 06, 1966 [id number]  LATE ENTRY  CSW spoke briefly with pt on 04/21/2020. Pt admits to relapse and is seeking detox/rehab.  Pt reports plan to "walk to my PCP's office across the street".  Pt denies any SI/HI however is rather regretful and wanting to get clean.   Pt did not want to talk further and ended call.    CSW noted the following day, pt was seen in ED.  CSW attempted to reach pt and left a HIPPA compliant voice message and will try outreach again in later this week.   [name], MSW, LCSW Clinical Social Worker  [facility] [phone]

## 2020-04-24 ENCOUNTER — Other Ambulatory Visit: Payer: Self-pay | Admitting: *Deleted

## 2020-04-24 NOTE — Patient Outreach (Signed)
[facility]) Care Management  04/24/2020  Nicolas [name] Jun 12, 1966 [id number]   CSW attempted to reach pt 04/23/2020 and was unable.  CSW left a HIPPA compliant voice message and will await call back or try again per policy.  CSW will mail pt an Unsuccessful outreach letter.   [name], MSW, LCSW Clinical Social Worker  [facility] [phone]

## 2020-04-30 ENCOUNTER — Other Ambulatory Visit: Payer: Self-pay | Admitting: *Deleted

## 2020-04-30 ENCOUNTER — Ambulatory Visit: Payer: Self-pay | Admitting: *Deleted

## 2020-04-30 NOTE — Patient Outreach (Signed)
[facility]) Care Management  04/30/2020  Nicolas [name] Jan 20, 1966 [id number]   CSW spoke briefly with pt on 04/29/2020 who reported he was on his way home from rehab. "I apologize for how I talked to you last time, I was not in a good place".  Pt asked to call back tomorrow as he was just arriving home and planning to go for a walk.   CSW outreached pt again on 04/30/2020 and was unable to speak with pt.  CSW left a HIPPA compliant message and will await call back or try again in 3-4 business days.   [name], MSW, LCSW Clinical Social Worker  [facility] [phone]

## 2020-05-03 ENCOUNTER — Emergency Department (HOSPITAL_COMMUNITY)
Admission: EM | Admit: 2020-05-03 | Discharge: 2020-05-04 | Disposition: A | Discharge status: Home / Self Care | Payer: 59 | Attending: Emergency Medicine | Admitting: Emergency Medicine

## 2020-05-03 ENCOUNTER — Encounter (HOSPITAL_COMMUNITY): Payer: Self-pay | Admitting: Emergency Medicine

## 2020-05-03 ENCOUNTER — Other Ambulatory Visit: Payer: Self-pay

## 2020-05-03 DIAGNOSIS — F17210 Nicotine dependence, cigarettes, uncomplicated: Secondary | ICD-10-CM | POA: Insufficient documentation

## 2020-05-03 DIAGNOSIS — Z20822 Contact with and (suspected) exposure to covid-19: Secondary | ICD-10-CM | POA: Insufficient documentation

## 2020-05-03 DIAGNOSIS — I10 Essential (primary) hypertension: Secondary | ICD-10-CM | POA: Diagnosis not present

## 2020-05-03 DIAGNOSIS — D7589 Other specified diseases of blood and blood-forming organs: Secondary | ICD-10-CM | POA: Insufficient documentation

## 2020-05-03 DIAGNOSIS — R45851 Suicidal ideations: Secondary | ICD-10-CM | POA: Diagnosis present

## 2020-05-03 DIAGNOSIS — R7401 Elevation of levels of liver transaminase levels: Secondary | ICD-10-CM | POA: Diagnosis not present

## 2020-05-03 DIAGNOSIS — Z79899 Other long term (current) drug therapy: Secondary | ICD-10-CM | POA: Diagnosis not present

## 2020-05-03 DIAGNOSIS — F10229 Alcohol dependence with intoxication, unspecified: Secondary | ICD-10-CM | POA: Insufficient documentation

## 2020-05-03 MED ORDER — PANTOPRAZOLE SODIUM 40 MG IV SOLR
40.0000 mg | Freq: Once | INTRAVENOUS | Status: AC
  Administered 2020-05-04: 40 mg via INTRAVENOUS
  Filled 2020-05-03: qty 40

## 2020-05-03 MED ORDER — ONDANSETRON HCL 4 MG/2ML IJ SOLN
4.0000 mg | Freq: Once | INTRAMUSCULAR | Status: AC
  Administered 2020-05-04: 4 mg via INTRAVENOUS
  Filled 2020-05-03: qty 2

## 2020-05-03 MED ORDER — THIAMINE HCL 100 MG/ML IJ SOLN
Freq: Once | INTRAVENOUS | Status: AC
  Filled 2020-05-03: qty 1000

## 2020-05-03 NOTE — ED Triage Notes (Signed)
Pt presents by [employer] under IVC for intoxication and suicidal ideation. Pt says that he surrenders to alcohol.

## 2020-05-03 NOTE — ED Provider Notes (Signed)
[facility]-EM[facility]ENCY DEPT Provider Note: Nicolas Dell, MD, FACEP  CSN: [id number] MRN: [id number] ARRIVAL: 05/03/20 at 2149 ROOM: [facility]ALB   CHIEF COMPLAINT  Suicidal   HISTORY OF PRESENT ILLNESS  05/03/20 11:07 PM Nicolas [name] is a 54 y.o. adult with a history of alcoholism for several years.  He is here under involuntary commitment which states "respondent stated several times he wanted to drink himself to death.  He also stated he wanted to run into traffic to kill himself and bash his brains in.  Respondent admitted he has been drinking for 7 straight days."  The patient tells me that when he drinks alcohol he wants to die.  He tells me he does not have a specific intent to kill himself.  He cannot tell me how much he has had to drink today.  He is having epigastric and right upper quadrant pain which is moderate, worse with palpation.  He does not know if this is like previous pancreatitis.    Past Medical History:  Diagnosis Date  . Alcohol abuse   . Hypertension   . Pancreatitis     Past Surgical History:  Procedure Laterality Date  . NASAL SEPTUM SURGERY      History reviewed. No pertinent family history.  Social History   Tobacco Use  . Smoking status: Current Every Day Smoker    Packs/day: 0.50    Types: Cigarettes  . Smokeless tobacco: Never Used  Vaping Use  . Vaping Use: Never used  Substance Use Topics  . Alcohol use: Yes    Alcohol/week: 6.0 standard drinks    Types: 6 Cans of beer per week    Comment: daily use  . Drug use: Not Currently    Types: Cocaine    Comment: stopped 01/2019    Prior to Admission medications   Medication Sig Start Date End Date Taking? Authorizing Provider  acamprosate (CAMPRAL) 333 MG tablet Take 666 mg by mouth 3 (three) times daily with meals.    [provider]  atenolol (TENORMIN) 25 MG tablet Take 25 mg by mouth daily. 08/31/19   [provider]  chlordiazePOXIDE (LIBRIUM) 25 MG capsule 50mg  PO TID  x 1D, then 25-50mg  PO BID X 1D, then 25-50mg  PO QD X 1D Patient not taking: Reported on 04/21/2020 02/20/20   04/21/20, MD  fenofibrate 160 MG tablet Take 160 mg by mouth daily.  11/22/19   [provider]  hydrOXYzine (VISTARIL) 25 MG capsule Take 25 mg by mouth in the morning and at bedtime.    [provider]  omeprazole (PRILOSEC) 20 MG capsule Take 20 mg by mouth daily. 04/01/20   [provider]  ondansetron (ZOFRAN ODT) 4 MG disintegrating tablet Take 1 tablet (4 mg total) by mouth every 8 (eight) hours as needed for nausea or vomiting. Patient not taking: Reported on 04/21/2020 01/26/20   [name], 01/28/20, MD  pantoprazole (PROTONIX) 20 MG tablet Take 1 tablet (20 mg total) by mouth daily. Patient not taking: Reported on 01/26/2020 10/31/19 12/30/19  [name], 01/01/20 C, PA-C  sucralfate (CARAFATE) 1 g tablet Take 1 tablet (1 g total) by mouth 4 (four) times daily -  with meals and at bedtime for 14 days. Patient taking differently: Take 1 g by mouth daily.  10/31/19 04/21/20  [name], Shawn C, PA-C    Allergies Caffeine and Bean pod extract   REVIEW OF SYSTEMS  Negative except as noted here or in the History of Present Illness.  PHYSICAL EXAMINATION  Initial Vital Signs Blood pressure (!) 155/90, pulse 88, temperature 98 F (36.7 C), temperature source Oral, resp. rate 18, height 5\' 10"  (1.778 m), weight 86.2 kg, SpO2 94 %.  Examination General: Well-developed, well-nourished adult in no acute distress; appearance consistent with age of record HENT: normocephalic; atraumatic Eyes: Normal appearance Neck: supple Heart: regular rate and rhythm Lungs: clear to auscultation bilaterally Abdomen: soft; nondistended; epigastric and right upper quadrant tenderness; no masses or hepatosplenomegaly; bowel sounds present Extremities: No deformity; full range of motion; pulses normal Neurologic: Awake, alert; motor function intact in all extremities and symmetric; no  facial droop Skin: Warm and dry Psychiatric: Cooperative but mildly agitated, pacing around; wishes he would die   RESULTS  Summary of this visit's results, reviewed and interpreted by myself:   EKG Interpretation  Date/Time:    Ventricular Rate:    PR Interval:    QRS Duration:   QT Interval:    QTC Calculation:   R Axis:     Text Interpretation:        Laboratory Studies: Results for orders placed or performed during the hospital encounter of 05/03/20 (from the past 24 hour(s))  Comprehensive metabolic panel     Status: Abnormal   Collection Time: 05/03/20 10:01 PM  Result Value Ref Range   Sodium 143 135 - 145 mmol/L   Potassium 3.6 3.5 - 5.1 mmol/L   Chloride 105 98 - 111 mmol/L   CO2 24 22 - 32 mmol/L   Glucose, Bld 121 (H) 70 - 99 mg/dL   BUN <5 (L) 6 - 20 mg/dL   Creatinine, Ser 05/05/20 0.61 - 1.24 mg/dL   Calcium 9.6 8.9 - 2.94 mg/dL   Total Protein 7.1 6.5 - 8.1 g/dL   Albumin 4.6 3.5 - 5.0 g/dL   AST 76.5 (H) 15 - 41 U/L   ALT 121 (H) 0 - 44 U/L   Alkaline Phosphatase 58 38 - 126 U/L   Total Bilirubin 0.4 0.3 - 1.2 mg/dL   GFR, Estimated 465 >03 mL/min   Anion gap 14 5 - 15  Ethanol     Status: Abnormal   Collection Time: 05/03/20 10:01 PM  Result Value Ref Range   Alcohol, Ethyl (B) 260 (H) <10 mg/dL  Salicylate level     Status: Abnormal   Collection Time: 05/03/20 10:01 PM  Result Value Ref Range   Salicylate Lvl <7.0 (L) 7.0 - 30.0 mg/dL  Acetaminophen level     Status: Abnormal   Collection Time: 05/03/20 10:01 PM  Result Value Ref Range   Acetaminophen (Tylenol), Serum <10 (L) 10 - 30 ug/mL  CBC with Differential/Platelet     Status: Abnormal   Collection Time: 05/03/20 11:17 PM  Result Value Ref Range   WBC 6.7 4.0 - 10.5 K/uL   RBC 4.24 4.22 - 5.81 MIL/uL   Hemoglobin 15.0 13.0 - 17.0 g/dL   HCT 05/05/20 39 - 52 %   MCV 103.8 (H) 80.0 - 100.0 fL   MCH 35.4 (H) 26.0 - 34.0 pg   MCHC 34.1 30.0 - 36.0 g/dL   RDW 65.6 81.2 - 75.1 %   Platelets  261 150 - 400 K/uL   nRBC 0.0 0.0 - 0.2 %   Neutrophils Relative % 35 %   Neutro Abs 2.4 1.7 - 7.7 K/uL   Lymphocytes Relative 50 %   Lymphs Abs 3.3 0.7 - 4.0 K/uL   Monocytes Relative 11 %   Monocytes Absolute 0.8  0.1 - 1.0 K/uL   Eosinophils Relative 2 %   Eosinophils Absolute 0.1 0.0 - 0.5 K/uL   Basophils Relative 2 %   Basophils Absolute 0.1 0.0 - 0.1 K/uL   Immature Granulocytes 0 %   Abs Immature Granulocytes 0.03 0.00 - 0.07 K/uL  Lipase, blood     Status: None   Collection Time: 05/03/20 11:17 PM  Result Value Ref Range   Lipase 28 11 - 51 U/L   Imaging Studies: No results found.  ED COURSE and MDM  Nursing notes, initial and subsequent vitals signs, including pulse oximetry, reviewed and interpreted by myself.  Vitals:   05/03/20 2157  BP: (!) 155/90  Pulse: 88  Resp: 18  Temp: 98 F (36.7 C)  TempSrc: Oral  SpO2: 94%  Weight: 86.2 kg  Height: 5\' 10"  (1.778 m)   Medications  LORazepam (ATIVAN) injection 1 mg (has no administration in time range)  LORazepam (ATIVAN) injection 0-4 mg (has no administration in time range)    Or  LORazepam (ATIVAN) tablet 0-4 mg (has no administration in time range)  nicotine (NICODERM CQ - dosed in mg/24 hours) patch 21 mg (has no administration in time range)  ondansetron (ZOFRAN) tablet 4 mg (has no administration in time range)  sodium chloride 0.9 % 1,000 mL with thiamine 100 mg, multivitamins adult 10 mL infusion ( Intravenous New Bag/Given 05/04/20 0010)  pantoprazole (PROTONIX) injection 40 mg (40 mg Intravenous Given 05/04/20 0004)  ondansetron (ZOFRAN) injection 4 mg (4 mg Intravenous Given 05/04/20 0004)   12:53 AM Estate first examination form completed.  We will hold patient for psychiatric evaluation, TTS consulted.  CIWA based alcohol withdrawal protocol initiated.   PROCEDURES  Procedures   ED DIAGNOSES     ICD-10-CM   1. Suicidal ideation  R45.851   2. Alcohol intoxication in active alcoholic with  complication (HCC)  F10.229   3. Elevated transaminase measurement  R74.01   4. Macrocytosis without anemia  D75.89        Delphina Schum, 05/06/20, MD 05/04/20 [phone]

## 2020-05-04 LAB — COMPREHENSIVE METABOLIC PANEL
ALT: 121 U/L — ABNORMAL HIGH (ref 0–44)
AST: 119 U/L — ABNORMAL HIGH (ref 15–41)
Albumin: 4.6 g/dL (ref 3.5–5.0)
Alkaline Phosphatase: 58 U/L (ref 38–126)
Anion gap: 14 (ref 5–15)
BUN: <5 mg/dL — ABNORMAL LOW (ref 6–20)
CO2: 24 mmol/L (ref 22–32)
Calcium: 9.6 mg/dL (ref 8.9–10.3)
Chloride: 105 mmol/L (ref 98–111)
Creatinine, Ser: 0.81 mg/dL (ref 0.61–1.24)
GFR, Estimated: >60 mL/min (ref >60)
Glucose, Bld: 121 mg/dL — ABNORMAL HIGH (ref 70–99)
Potassium: 3.6 mmol/L (ref 3.5–5.1)
Sodium: 143 mmol/L (ref 135–145)
Total Bilirubin: 0.4 mg/dL (ref 0.3–1.2)
Total Protein: 7.1 g/dL (ref 6.5–8.1)

## 2020-05-04 LAB — CBC WITH DIFFERENTIAL/PLATELET
Abs Immature Granulocytes: 0.03 10*3/uL (ref 0.00–0.07)
Basophils Absolute: 0.1 10*3/uL (ref 0.0–0.1)
Basophils Relative: 2 %
Eosinophils Absolute: 0.1 10*3/uL (ref 0.0–0.5)
Eosinophils Relative: 2 %
HCT: 44 % (ref 39.0–52.0)
Hemoglobin: 15 g/dL (ref 13.0–17.0)
Immature Granulocytes: 0 %
Lymphocytes Relative: 50 %
Lymphs Abs: 3.3 10*3/uL (ref 0.7–4.0)
MCH: 35.4 pg — ABNORMAL HIGH (ref 26.0–34.0)
MCHC: 34.1 g/dL (ref 30.0–36.0)
MCV: 103.8 fL — ABNORMAL HIGH (ref 80.0–100.0)
Monocytes Absolute: 0.8 10*3/uL (ref 0.1–1.0)
Monocytes Relative: 11 %
Neutro Abs: 2.4 10*3/uL (ref 1.7–7.7)
Neutrophils Relative %: 35 %
Platelets: 261 10*3/uL (ref 150–400)
RBC: 4.24 MIL/uL (ref 4.22–5.81)
RDW: 12.4 % (ref 11.5–15.5)
WBC: 6.7 10*3/uL (ref 4.0–10.5)
nRBC: 0 % (ref 0.0–0.2)

## 2020-05-04 LAB — RESP PANEL BY RT-PCR (FLU A&B, COVID) ARPGX2
Influenza A by PCR: NEGATIVE
Influenza B by PCR: NEGATIVE
SARS Coronavirus 2 by RT PCR: NEGATIVE

## 2020-05-04 LAB — RAPID URINE DRUG SCREEN, HOSP PERFORMED
Amphetamines: NOT DETECTED
Barbiturates: NOT DETECTED
Benzodiazepines: POSITIVE — AB
Cocaine: NOT DETECTED
Opiates: NOT DETECTED
Tetrahydrocannabinol: NOT DETECTED

## 2020-05-04 LAB — LIPASE, BLOOD: Lipase: 28 U/L (ref 11–51)

## 2020-05-04 LAB — SALICYLATE LEVEL: Salicylate Lvl: <7 mg/dL — ABNORMAL LOW (ref 7.0–30.0)

## 2020-05-04 LAB — ETHANOL: Alcohol, Ethyl (B): 260 mg/dL — ABNORMAL HIGH (ref <10)

## 2020-05-04 LAB — ACETAMINOPHEN LEVEL: Acetaminophen (Tylenol), Serum: <10 ug/mL — ABNORMAL LOW (ref 10–30)

## 2020-05-04 MED ORDER — ONDANSETRON HCL 4 MG/2ML IJ SOLN
4.0000 mg | Freq: Three times a day (TID) | INTRAMUSCULAR | Status: DC | PRN
  Administered 2020-05-04: 4 mg via INTRAVENOUS
  Filled 2020-05-04: qty 2

## 2020-05-04 MED ORDER — LORAZEPAM 2 MG/ML IJ SOLN
0.0000 mg | Freq: Four times a day (QID) | INTRAMUSCULAR | Status: DC
  Administered 2020-05-04 (×2): 2 mg via INTRAVENOUS
  Filled 2020-05-04 (×4): qty 1

## 2020-05-04 MED ORDER — LORAZEPAM 2 MG/ML IJ SOLN
1.0000 mg | Freq: Once | INTRAMUSCULAR | Status: AC
  Administered 2020-05-04: 1 mg via INTRAVENOUS
  Filled 2020-05-04: qty 1

## 2020-05-04 MED ORDER — LORAZEPAM 1 MG PO TABS: 0.0000 mg | ORAL_TABLET | Freq: Four times a day (QID) | ORAL | Status: DC

## 2020-05-04 MED ORDER — NICOTINE 21 MG/24HR TD PT24
21.0000 mg | MEDICATED_PATCH | Freq: Every day | TRANSDERMAL | Status: DC
  Administered 2020-05-04: 21 mg via TRANSDERMAL
  Filled 2020-05-04: qty 1

## 2020-05-04 MED ORDER — HALOPERIDOL 5 MG PO TABS
5.0000 mg | ORAL_TABLET | Freq: Once | ORAL | Status: AC
  Administered 2020-05-04: 5 mg via ORAL
  Filled 2020-05-04: qty 1

## 2020-05-04 MED ORDER — ONDANSETRON HCL 4 MG PO TABS: 4.0000 mg | ORAL_TABLET | Freq: Three times a day (TID) | ORAL | Status: DC | PRN

## 2020-05-04 NOTE — ED Notes (Signed)
Pt discharged from this ED in stable condition at this time. All discharge instructions and follow up care reviewed with pt with no further questions at this time. Pt ambulatory with steady gait, clear speech.  

## 2020-05-04 NOTE — ED Provider Notes (Addendum)
Cleared by psychiatry. Peers support has been consulted and given that he has had so many frequent visits for the same it may be worth it to have him wait here to see if he is willing to go to rehab or seek some help for substance abuse. If he does not fit that criteria or is able to go to rehab or want to go to rehab will likely just discharge.  Patient seen by peers support.  Does not on rehab.  Given resources and discharged.   Virgina Norfolk, DO 05/04/20 1020    Virgina Norfolk, DO 05/04/20 1142

## 2020-05-04 NOTE — BHH Counselor (Signed)
Clinician Dorian Heckle, RN via secure chat in Epic, if the pt can be moved in a private for his assessment.   Roseanna Rainbow, RN to inform the pt's nurse.   Redmond Pulling, MS, [facility], [facility] Triage Specialist [phone]

## 2020-05-04 NOTE — ED Notes (Signed)
TTS currently evaluating pt.

## 2020-05-04 NOTE — BH Assessment (Signed)
Per Assunta Found, NP, patient is psych cleared. Pending peer support follow up needs.

## 2020-05-04 NOTE — Patient Outreach (Signed)
ED Peer Support Specialist Patient Intake (Complete at intake & 30-60 Day Follow-up)  Name: Nicolas [name]  MRN: [id number]  Age: 54 y.o.   Date of Admission: 05/04/2020  Intake: Initial Comments:      Primary Reason Admitted: Suicidal   Lab values: Alcohol/ETOH: Positive Positive UDS? No Amphetamines: No Barbiturates: No Benzodiazepines: Yes Cocaine: No Opiates: No Cannabinoids: No  Demographic information: Gender: Male Ethnicity: Allaire Marital Status: Single Insurance Status: Diplomatic Services operational officer (Work Neurosurgeon, Physicist, medical, etc.: No Lives with: Alone Living situation: House/Apartment  Reported Patient History: Patient reported health conditions: None Patient aware of HIV and hepatitis status: No  In past year, has patient visited ED for any reason? No  Number of ED visits:    Reason(s) for visit:    In past year, has patient been hospitalized for any reason? Yes  Number of hospitalizations: 3  Reason(s) for hospitalization: same situation  In past year, has patient been arrested? No  Number of arrests:    Reason(s) for arrest:    In past year, has patient been incarcerated?    Number of incarcerations:    Reason(s) for incarceration:    In past year, has patient received medication-assisted treatment? No  In past year, patient received the following treatments: Residential treatment (non-hospital)  In past year, has patient received any harm reduction services? No  Did this include any of the following?    In past year, has patient received care from a mental health provider for diagnosis other than SUD? No  In past year, is this first time patient has overdosed? No  Number of past overdoses:    In past year, is this first time patient has been hospitalized for an overdose? No  Number of hospitalizations for overdose(s):    Is patient currently receiving treatment for a mental health diagnosis?  No  Patient reports experiencing difficulty participating in SUD treatment: No    Most important reason(s) for this difficulty?    Has patient received prior services for treatment? No  In past, patient has received services from following agencies:    Plan of Care:  Suggested follow up at these agencies/treatment centers: Other (comment), [facility])  Other information: CPSS met with Pt an was made aware that he has been in a detox facility recently an that he needs to handle some personal things before attempting to go to another facility. CPSS discussed a few options with his that Pt can decide from. CPSS issuing resource information an contact information to follow up with in community.   Nicolas [name], CPSS  05/04/2020 11:46 AM

## 2020-05-04 NOTE — Discharge Instructions (Signed)
To help you maintain a sober lifestyle, a substance abuse treatment program may be beneficial to you.  Contact one of the following facilities at your earliest opportunity to ask about enrolling:  RESIDENTIAL PROGRAMS:       [facility]      [address]      [place], [place] [address]      [phone]       [facility] [place]      [address] [place] [address]      [phone]       [facility]      [address]      [place], [place] [address]      [phone]  CHEMICAL DEPENDENCY INTENSIVE OUTPATIENT PROGRAMS:       [facility]      [address]      [place], [place] [address]      [phone]       [facility]      [address]      , [place] [address]      [phone]  

## 2020-05-04 NOTE — ED Notes (Signed)
Pt out of room, pacing, anxious, requesting to go home.

## 2020-05-04 NOTE — BH Assessment (Addendum)
Assessment Note  Nicolas [name] is an 54 y.o. adult. who presented to [facility]. He presents under IVC initiated by Patent examiner and upheld by EDP Meriel Flavors, MD. Patient is requesting detox. He reported a history of alcohol abuse starting at the age of 84. He drinks daily. He drinks 15-16 cans of beer daily. His last drink was 11am today. He also reports a history of cocaine use. Last use was July, 2020. Longest period of sobriety is 85-90 days. Negative consequences of use: Financial, Personal relationships, Work / Programmer, multimedia. Current withdrawal Symptoms: Irritability, Nausea / Vomiting, Sweats, Tremors. No history of seizures or DT's.    Patient denies current SI. He reports a history of suicidal ideations when he starts to feel withdrawal symptoms but never a plan and/or intent. He last had suicidal ideations last night. However, today is able to contract for safety. No self mutilating behaviors. Current depressive symptoms: Feeling angry/irritable, Feeling worthless/self pity, Loss of interest in usual pleasures, Guilt, Fatigue, Isolating, Tearfulness, Insomnia, Despondent. No family history of mental illness. Denies current HI. No history of harm and/or aggressive behaviors. Stressors include homelessness. Pt acknowledges symptoms including crying spells, social withdrawal, loss of interest in usual pleasures, fatigue, irritability, decreased concentration, decreased sleep. 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 He says he began drinking heavily and went to treatment three time in 2020 for alcohol use at [facility]. This year he has received treatment at [facility] and [facility].Pt reports he is currently homeless and staying in motels.    Pt is casually dressed, alert and oriented x4. He appears to be actively withdrawing. Speech is normal, at moderate volume and normal pace. Motor behavior appears restless. He moved from laying position on  the bed to standing to pacing. Eye contact is poor.  Pt's mood is depressed and anxious, affect is congruent with mood. Thought process is coherent and relevant. There is no indication pt is not currently responding to internal stimuli or experiencing delusional thought content. Pt was cooperative throughout assessment. He is requesting inpatient detox treatment.  Per Nelly Rout, MD and Assunta Found, NP, this pt does not require psychiatric hospitalization at this time.  Pt presents under IVC initiated by law enforcement and upheld by EDP Meriel Flavors, MD, which has been rescinded by Nelly Rout, MD.  Pt is psychiatrically cleared.    Pt would also benefit from seeing Peer Support Specialists, and a peer support consult has been ordered for pt.    Diagnosis: Major Depressive Disorder, Recurrent, Severe, without psychotic features and Alcohol Use Disorder, Severe  Past Medical History:  Past Medical History:  Diagnosis Date  . Alcohol abuse   . Hypertension   . Pancreatitis     Past Surgical History:  Procedure Laterality Date  . NASAL SEPTUM SURGERY      Family History: History reviewed. No pertinent family history.  Social History:  reports that [name] has been smoking cigarettes. [name] has been smoking about 0.50 packs per day. [name] has never used smokeless tobacco. [name] reports current alcohol use of about 6.0 standard drinks of alcohol per week. [name] reports previous drug use. Drug: Cocaine.  Additional Social History:  Alcohol / Drug Use Pain Medications: See MAR Prescriptions: See MAR Over the Counter: See MAR History of alcohol / drug use?: Yes Longest period of sobriety (when/how long): Pt reported, 85-90 days of sobrtiety."this yr and part of  last yr" Negative Consequences of Use: Financial, Personal relationships, Work / School Withdrawal Symptoms: Sweats, Tremors, Fever / Chills, Irritability, Nausea / Vomiting, Cramps,  Weakness Substance #1 Name of Substance 1: Alcohol. 1 - Age of First Use: 18. 1 - Amount (size/oz): 15-16 beers ("big cans") 1 - Frequency: Everyday. 1 - Duration: 4 yrs 1 - Last Use / Amount: 11//21/2021; 15-16 beers  CIWA: CIWA-Ar BP: 112/86 Pulse Rate: (!) 104 Nausea and Vomiting: 2 Tactile Disturbances: none Tremor: two Auditory Disturbances: not present Paroxysmal Sweats: barely perceptible sweating, palms moist Visual Disturbances: not present Anxiety: three Headache, Fullness in Head: very mild Agitation: two Orientation and Clouding of Sensorium: oriented and can do serial additions CIWA-Ar Total: 11 COWS:    Allergies:  Allergies  Allergen Reactions  . Caffeine Palpitations and Anaphylaxis    Other reaction(s): Heart rhythm/rate changes Heart beat super fast and bean enzymes    . Bean Pod Extract Palpitations    Home Medications: (Not in a hospital admission)   OB/GYN Status:  No LMP recorded.  General Assessment Data Location of Assessment: [facility] TTS Assessment: In system Is this a Tele or Face-to-Face Assessment?: Tele Assessment Is this an Initial Assessment or a Re-assessment for this encounter?: Initial Assessment Patient Accompanied by:: N/A Language Other than English: No Living Arrangements: Other (Comment) (Alone.) What gender do you identify as?: Male Date Telepsych consult ordered in [facility]:  (05/04/2020) Marital status: Single Maiden name:  (n/a) Pregnancy Status: No Living Arrangements: Alone Can pt return to current living arrangement?: Yes Admission Status: Involuntary Is patient capable of signing voluntary admission?: Yes Referral Source: Other Insurance type:  ([employer] )     Crisis Care Plan Living Arrangements: Alone Name of Psychiatrist: None. Name of Therapist: None.  Education Status Is the patient employed, unemployed or receiving disability?: Employed  Risk to self with the past 6 months Suicidal Ideation:  No Has patient been a risk to self within the past 6 months prior to admission? : Yes Suicidal Intent: No Has patient had any suicidal intent within the past 6 months prior to admission? : Yes Is patient at risk for suicide?: No Suicidal Plan?: No Has patient had any suicidal plan within the past 6 months prior to admission? : No Access to Means: No Specify Access to Suicidal Means:  (no ) What has been your use of drugs/alcohol within the last 12 months?:  (alcohol ) Previous Attempts/Gestures: No How many times?:  (0) Other Self Harm Risks:  (Alcohol Abuse ) Triggers for Past Attempts: None known Intentional Self Injurious Behavior: None Family Suicide History: No Recent stressful life event(s): Other (Comment), Financial Problems ("Struggling with alcohol use") Persecutory voices/beliefs?: No Depression: Yes Depression Symptoms: Feeling angry/irritable, Feeling worthless/self pity, Loss of interest in usual pleasures, Guilt, Fatigue, Isolating, Tearfulness, Insomnia, Despondent Substance abuse history and/or treatment for substance abuse?: Yes Suicide prevention information given to non-admitted patients: Not applicable  Risk to Others within the past 6 months Homicidal Ideation: No Does patient have any lifetime risk of violence toward others beyond the six months prior to admission? : No Thoughts of Harm to Others: No Current Homicidal Plan: No Access to Homicidal Means: No Identified Victim:  (n/a) History of harm to others?: No Assessment of Violence: None Noted Violent Behavior Description:  (patient is calm and cooperative ) Does patient have access to weapons?: No Criminal Charges Pending?: No Does patient have a court date: No Is patient on probation?: No  Psychosis Hallucinations: None  noted Delusions: None noted  Mental Status Report Appearance/Hygiene: In scrubs Motor Activity: Agitation Speech: Logical/coherent Level of Consciousness: Alert Affect: Other  (Comment) Orientation: Person, Place, Time, Situation  Cognitive Functioning Concentration: Normal Memory: Recent Intact, Remote Intact Is patient IDD: No Insight: Good Impulse Control: Poor Appetite: Good Have you had any weight changes? : No Change Sleep: No Change Total Hours of Sleep:  ("I don't know") Vegetative Symptoms: None  ADLScreening [facility] Assessment Services) Patient's cognitive ability adequate to safely complete daily activities?: Yes Patient able to express need for assistance with ADLs?: Yes Independently performs ADLs?: Yes (appropriate for developmental age)  Prior Inpatient Therapy Prior Inpatient Therapy: Yes Prior Therapy Dates:  ("last detox was a few weeks ago"-[facility]) Prior Therapy Facilty/Provider(s):  Engineer, civil (consulting), [facility], [facility], "and many oth) Reason for Treatment:  (detox)  Prior Outpatient Therapy Prior Outpatient Therapy: No Does patient have an ACCT team?: No Does patient have Intensive In-House Services?  : No Does patient have [facility] services? : No Does patient have P4CC services?: No  ADL Screening (condition at time of admission) Patient's cognitive ability adequate to safely complete daily activities?: Yes Patient able to express need for assistance with ADLs?: Yes Independently performs ADLs?: Yes (appropriate for developmental age)       Abuse/Neglect Assessment (Assessment to be complete while patient is alone) Abuse/Neglect Assessment Can Be Completed:  (emotional) Physical Abuse: Denies Verbal Abuse: Denies Sexual Abuse: Denies Exploitation of patient/patient's resources: Denies Self-Neglect: Denies     Merchant navy officer (For Healthcare) Does Patient Have a Medical Advance Directive?: No          Disposition: Per Nelly Rout, MD and Assunta Found, NP, this pt does not require psychiatric hospitalization at this time.  Pt presents under IVC initiated by law enforcement and upheld by EDP  Meriel Flavors, MD, which has been rescinded by Nelly Rout, MD.  Pt is psychiatrically cleared.    Pt would also benefit from seeing Peer Support Specialists, and a peer support consult has been ordered for pt.    Disposition Initial Assessment Completed for this Encounter: Yes  On Site Evaluation by:   Reviewed with Physician:    Melynda Ripple 05/04/2020 10:59 AM

## 2020-05-04 NOTE — ED Notes (Signed)
Pt up out of bed, in hallway. Looking for ativan. Pt redirected back to bed.

## 2020-05-04 NOTE — ED Notes (Signed)
Pt resting in stretcher, breathing even and unlabored. NAD.

## 2020-05-04 NOTE — BH Assessment (Signed)
Clinician received a message from [place], Charity fundraiser (via secure chat in Epic) pt is currently sleeping, she will notify once pt is awake and able to engage.    Redmond Pulling, MS, [facility], [facility] Triage Specialist [phone]

## 2020-05-04 NOTE — ED Notes (Signed)
Peer support at bedside 

## 2020-05-04 NOTE — BH Assessment (Addendum)
[facility] Assessment Progress Note  Per Nelly Rout, MD, this pt does not require psychiatric hospitalization at this time.  Pt presents under IVC initiated by law enforcement and upheld by EDP Meriel Flavors, MD, which has been rescinded by Nelly Rout, MD.  Pt is psychiatrically cleared.  Discharge instructions include referral information for area substance abuse treatment providers.  Pt would also benefit from seeing Peer Support Specialists, and a peer support consult has been ordered for pt.  EDP Virgina Norfolk, DO and pt's nurse, Colin Mulders, have been notified.  Doylene Canning, MA Triage Specialist [phone]

## 2020-05-05 ENCOUNTER — Encounter (HOSPITAL_COMMUNITY): Payer: Self-pay | Admitting: Registered Nurse

## 2020-05-05 ENCOUNTER — Other Ambulatory Visit: Payer: Self-pay | Admitting: *Deleted

## 2020-05-05 ENCOUNTER — Emergency Department (HOSPITAL_COMMUNITY)
Admission: EM | Admit: 2020-05-05 | Discharge: 2020-05-06 | Disposition: A | Discharge status: Home / Self Care | Payer: 59 | Attending: Emergency Medicine | Admitting: Emergency Medicine

## 2020-05-05 ENCOUNTER — Ambulatory Visit: Payer: Self-pay | Admitting: *Deleted

## 2020-05-05 ENCOUNTER — Ambulatory Visit (HOSPITAL_COMMUNITY)
Admission: EM | Admit: 2020-05-05 | Discharge: 2020-05-05 | Disposition: A | Discharge status: Other Acute Inpatient Hospital | Payer: 59 | Attending: Registered Nurse | Admitting: Registered Nurse

## 2020-05-05 ENCOUNTER — Encounter (HOSPITAL_COMMUNITY): Payer: Self-pay | Admitting: Emergency Medicine

## 2020-05-05 ENCOUNTER — Other Ambulatory Visit: Payer: Self-pay

## 2020-05-05 DIAGNOSIS — I10 Essential (primary) hypertension: Secondary | ICD-10-CM | POA: Insufficient documentation

## 2020-05-05 DIAGNOSIS — Y906 Blood alcohol level of 120-199 mg/100 ml: Secondary | ICD-10-CM | POA: Diagnosis not present

## 2020-05-05 DIAGNOSIS — F17210 Nicotine dependence, cigarettes, uncomplicated: Secondary | ICD-10-CM | POA: Diagnosis not present

## 2020-05-05 DIAGNOSIS — F1020 Alcohol dependence, uncomplicated: Secondary | ICD-10-CM | POA: Diagnosis not present

## 2020-05-05 DIAGNOSIS — Z79899 Other long term (current) drug therapy: Secondary | ICD-10-CM | POA: Diagnosis not present

## 2020-05-05 DIAGNOSIS — F1010 Alcohol abuse, uncomplicated: Secondary | ICD-10-CM | POA: Insufficient documentation

## 2020-05-05 DIAGNOSIS — F32A Depression, unspecified: Secondary | ICD-10-CM | POA: Diagnosis not present

## 2020-05-05 DIAGNOSIS — F419 Anxiety disorder, unspecified: Secondary | ICD-10-CM | POA: Diagnosis present

## 2020-05-05 LAB — ACETAMINOPHEN LEVEL: Acetaminophen (Tylenol), Serum: <10 ug/mL — ABNORMAL LOW (ref 10–30)

## 2020-05-05 LAB — CBC
HCT: 45.7 % (ref 39.0–52.0)
Hemoglobin: 15.4 g/dL (ref 13.0–17.0)
MCH: 34.8 pg — ABNORMAL HIGH (ref 26.0–34.0)
MCHC: 33.7 g/dL (ref 30.0–36.0)
MCV: 103.2 fL — ABNORMAL HIGH (ref 80.0–100.0)
Platelets: 270 10*3/uL (ref 150–400)
RBC: 4.43 MIL/uL (ref 4.22–5.81)
RDW: 12.1 % (ref 11.5–15.5)
WBC: 5.6 10*3/uL (ref 4.0–10.5)
nRBC: 0 % (ref 0.0–0.2)

## 2020-05-05 LAB — COMPREHENSIVE METABOLIC PANEL
ALT: 119 U/L — ABNORMAL HIGH (ref 0–44)
AST: 100 U/L — ABNORMAL HIGH (ref 15–41)
Albumin: 4.6 g/dL (ref 3.5–5.0)
Alkaline Phosphatase: 68 U/L (ref 38–126)
Anion gap: 12 (ref 5–15)
BUN: <5 mg/dL — ABNORMAL LOW (ref 6–20)
CO2: 25 mmol/L (ref 22–32)
Calcium: 9.8 mg/dL (ref 8.9–10.3)
Chloride: 103 mmol/L (ref 98–111)
Creatinine, Ser: 0.81 mg/dL (ref 0.61–1.24)
GFR, Estimated: >60 mL/min (ref >60)
Glucose, Bld: 111 mg/dL — ABNORMAL HIGH (ref 70–99)
Potassium: 3.6 mmol/L (ref 3.5–5.1)
Sodium: 140 mmol/L (ref 135–145)
Total Bilirubin: 0.6 mg/dL (ref 0.3–1.2)
Total Protein: 7 g/dL (ref 6.5–8.1)

## 2020-05-05 LAB — LIPASE, BLOOD: Lipase: 27 U/L (ref 11–51)

## 2020-05-05 LAB — ETHANOL: Alcohol, Ethyl (B): 169 mg/dL — ABNORMAL HIGH (ref <10)

## 2020-05-05 LAB — RAPID URINE DRUG SCREEN, HOSP PERFORMED
Amphetamines: NOT DETECTED
Barbiturates: NOT DETECTED
Benzodiazepines: POSITIVE — AB
Cocaine: NOT DETECTED
Opiates: NOT DETECTED
Tetrahydrocannabinol: NOT DETECTED

## 2020-05-05 LAB — SALICYLATE LEVEL: Salicylate Lvl: <7 mg/dL — ABNORMAL LOW (ref 7.0–30.0)

## 2020-05-05 MED ORDER — PANTOPRAZOLE SODIUM 40 MG IV SOLR
40.0000 mg | Freq: Once | INTRAVENOUS | Status: AC
  Administered 2020-05-05: 40 mg via INTRAVENOUS
  Filled 2020-05-05: qty 40

## 2020-05-05 MED ORDER — ALUM & MAG HYDROXIDE-SIMETH 200-200-20 MG/5ML PO SUSP
30.0000 mL | Freq: Once | ORAL | Status: AC
  Administered 2020-05-05: 30 mL via ORAL
  Filled 2020-05-05: qty 30

## 2020-05-05 MED ORDER — LIDOCAINE VISCOUS HCL 2 % MT SOLN
15.0000 mL | Freq: Once | OROMUCOSAL | Status: AC
  Administered 2020-05-05: 15 mL via ORAL
  Filled 2020-05-05: qty 15

## 2020-05-05 MED ORDER — LORAZEPAM 2 MG/ML IJ SOLN
1.0000 mg | Freq: Once | INTRAMUSCULAR | Status: AC
  Administered 2020-05-05: 1 mg via INTRAVENOUS
  Filled 2020-05-05: qty 1

## 2020-05-05 MED ORDER — CHLORDIAZEPOXIDE HCL 25 MG PO CAPS: ORAL_CAPSULE | ORAL | 0 refills | Status: DC

## 2020-05-05 MED ORDER — CHLORDIAZEPOXIDE HCL 25 MG PO CAPS
25.0000 mg | ORAL_CAPSULE | Freq: Once | ORAL | Status: AC
  Administered 2020-05-05: 25 mg via ORAL
  Filled 2020-05-05: qty 1

## 2020-05-05 MED ORDER — LACTATED RINGERS IV BOLUS
1000.0000 mL | Freq: Once | INTRAVENOUS | Status: AC
  Administered 2020-05-05: 1000 mL via INTRAVENOUS

## 2020-05-05 MED ORDER — LORAZEPAM 1 MG PO TABS
2.0000 mg | ORAL_TABLET | Freq: Once | ORAL | Status: AC
  Administered 2020-05-05: 2 mg via ORAL
  Filled 2020-05-05: qty 2

## 2020-05-05 NOTE — Discharge Instructions (Signed)
He received a dose of Librium tonight and a prescription was sent to your pharmacy and you can start taking 1 in the morning.

## 2020-05-05 NOTE — Patient Outreach (Signed)
[facility]) Care Management  05/05/2020  Marrio Scribner Feb 20, 1966 [id number]   CSW attempted to make contact with pt today and was unsuccessful.  CSW was able to leave a HIPPA compliant voice message and will await callback or try again in 3-4 business days    [name], MSW, LCSW Clinical Social Worker  [facility] [employer] [phone]

## 2020-05-05 NOTE — ED Triage Notes (Addendum)
Patient transferred to ED from [facility] via [facility] for medical clerance. Pt presented to [facility] today, intoxicated wanting help to detox from alcohol. TTS found that he did not meet inpatient criteria and transferred him to  [facility] for medical clearance and to follow up with [facility]. Per [facility] pt was on floor crying on abdominal pain that feels like his past hx of pancreatitis. Pt's last drink around 3-4 hours ago. Pts was p[sych cleared at [facility] today.

## 2020-05-05 NOTE — ED Provider Notes (Addendum)
[facility] Medical Screening Exam  Patient Name: Nicolas [name] MRN: [id number] Date of Evaluation: 05/05/20 Chief Complaint:   Diagnosis:  Final diagnoses:  None    History of Present illness: Nicolas [name] is a 54 y.o. adult male presented to [facility] with complaints of alcohol use disorder and requesting detox.  Nicolas [name], 54 y.o., adult patient seen face to face by this provider, consulted with Dr. Lucianne Muss; and chart reviewed on 05/05/20.  On evaluation Nicolas [name] reports he is ready for detox.  States that he has to stop drinking before he loses everything he has.  Unable to complete assessment related to complaints of pain and patient laying on floor yelling that he is in pain.  Patient has had several ED visits in the last few weeks and has been offered resources that he has not followed up with.   During evaluation Nicolas [name] is laying on floor yelling that he is in pain.  He is alert/oriented x 4; cooperative; and mood anxious is congruent with affect.  He does not appear to be responding to internal/external stimuli or delusional thoughts.  Patient denies suicidal/self-harm/homicidal ideation, psychosis, and paranoia.  Patient to be transported to [facility] ED for medical clearance.  Patient has been psychiatrically cleared and can be discharged once medically cleared.     Psychiatric Specialty Exam  Presentation  General Appearance:Appropriate for Environment;Casual;Neat  Eye Contact:Fair  Speech:Clear and Coherent;Normal Rate  Speech Volume:Normal  Handedness:Right   Mood and Affect  Mood:Anxious  Affect:Congruent   Thought Process  Thought Processes:Coherent;Goal Directed;Linear  Descriptions of Associations:Intact  Orientation:Full (Time, Place and Person)  Thought Content:Logical;WDL  Hallucinations:None  Ideas of Reference:None  Suicidal Thoughts:No  Homicidal Thoughts:No   Sensorium  Memory:No data  recorded Judgment:Intact  Insight:Fair;Present   Executive Functions  Concentration:Good  Attention Span:Good  Recall:Good  Fund of Knowledge:Good  Language:Good   Psychomotor Activity  Psychomotor Activity:Normal   Assets  Assets:Communication Skills;Desire for Improvement;Financial Resources/Insurance;Housing   Sleep  Sleep:Good  Number of hours: No data recorded  Physical Exam: Physical Exam Vitals and nursing note reviewed.  Constitutional:      Appearance: Normal appearance.  Cardiovascular:     Rate and Rhythm: Normal rate and regular rhythm.  Pulmonary:     Effort: Pulmonary effort is normal.  Musculoskeletal:        General: Normal range of motion.  Skin:    General: Skin is warm and dry.  Neurological:     General: No focal deficit present.     Mental Status: Nicolas [name] is alert and oriented to person, place, and time.  Psychiatric:        Attention and Perception: Attention and perception normal. Nicolas [name] does not perceive auditory or visual hallucinations.        Mood and Affect: Affect normal. Mood is anxious.        Speech: Speech normal.        Behavior: Behavior normal. Behavior is cooperative.        Thought Content: Thought content normal. Thought content is not paranoid or delusional. Thought content does not include homicidal or suicidal ideation.        Cognition and Memory: Cognition and memory normal.        Judgment: Judgment is impulsive.    Review of Systems  Gastrointestinal: Positive for abdominal pain and nausea.       Patient reporting difficulty standing related to the pain in abdomen.  Reports history of pancreatitis  Neurological: Positive for dizziness. Seizures: Reports seizure with alcohol withdrawal.  Psychiatric/Behavioral: Positive for substance abuse. Negative for memory loss and suicidal ideas. Depression: Stable. The patient does not have insomnia. Nervous/anxious: Stable.    Blood pressure (!) 141/96,  pulse 89, temperature 98.2 F (36.8 C), temperature source Tympanic, SpO2 95 %. There is no height or weight on file to calculate BMI.  Musculoskeletal: Strength & Muscle Tone: within normal limits Gait & Station: normal Patient leans: N/A   [facility] MSE Discharge Disposition for Follow up and Recommendations: Based on my evaluation the patient appears to have an emergency medical condition for which I recommend the patient be transferred to the emergency department for further evaluation.   Spoke to Dr. Anitra Lauth and report given on patient.    Disposition:  Psychiatrically cleared No evidence of imminent risk to self or others at present.   Patient does not meet criteria for psychiatric inpatient admission. Supportive therapy provided about ongoing stressors. Discussed crisis plan, support from social network, calling 911, coming to the Emergency Department, and calling Suicide Hotline.   Patient has been psychiatrically cleared and sent to the ED related to medical complaints.  Report has been given to Dr. Anitra Lauth and patient can be discharged from ED once he is medically cleared without psychiatric follow up or TTS consult.  Resources have been given to patient prior to transfer.     Jermery Caratachea, NP 05/05/2020, 5:10 PM

## 2020-05-05 NOTE — ED Notes (Signed)
Patient continuously reporting he wants treatment.  Reports he wants to go to [facility].  Also reports he is having shakiness and sweating now and it has been about 6 hrs since his last drink.

## 2020-05-05 NOTE — ED Notes (Signed)
Pt escorted out by ems to be transported to [facility] for medical evaluation.

## 2020-05-05 NOTE — BH Assessment (Signed)
Comprehensive Clinical Assessment (CCA) Screening, Triage and Referral Note  05/05/2020 Nicolas [name] [id number]   Patient is a 54 year old male with a history of Alcohol Use Disorder, severe who presents voluntarily to [facility] via [facility] for assessment.  Patient reports feeling suicidal after a recent relapse.  Patient is intoxicated on arrival to [facility].  He presents with slurred speech, mood lability and agitation; requesting detox treatment.  Patient is observed laying in the floor to "relieve my stomach pain, I have pancreatitis" reports 10/10 pain level.  He is also reporting nausea and history of DTs and "shaking, not sure if I've had seizures."   Patient endorses vague SI, stating he thought about "just drinking myself to death" earlier today.  Patient has had several recent ED visits and he states, "Are you going to just give me ativan and get rid of me?"  Per EHR, patient met with Peer Support during last ED visit and stated he wasn't ready for treatment and has "things to take care of first."   Patient is currently stating he is interested in detox and is "ready."  He is concerned that the provider is recommending medical evaluation, as he is concerned he will be discharged from the ED without referral to treatment.  Informed patient that referral information and recommendation is being sent to the ED.  The recommendation is that patient be referred to [facility] for detox once medically stable for transfer.   Disposition: Per Earleen Newport, NP patient does not meet criteria for inpatient treatment.  He is interested in detox and residential SA treatment once medically cleared and stable for transfer.   Chief Complaint:  Chief Complaint  Patient presents with  . Alcohol Problem   Visit Diagnosis: Alcohol Use Disorder, severe  Patient Reported Information How did you hear about [place]? Self   Referral name: Patient presents via [employer] voluntarily.   Referral phone  number: No data recorded Whom do you see for routine medical problems? I don't have a doctor   Practice/Facility Name: No data recorded  Practice/Facility Phone Number: No data recorded  Name of Contact: No data recorded  Contact Number: No data recorded  Contact Fax Number: No data recorded  Prescriber Name: No data recorded  Prescriber Address (if known): No data recorded What Is the Reason for Your Visit/Call Today? Patient reports he has been feeling suicidal after a recent relapse.  He had his last drink today at 3:30PM and drinks 12 + beers daily.  How Long Has This Been Causing You Problems? > than 6 months  Have You Recently Been in Any Inpatient Treatment (Hospital/Detox/Crisis Center/28-Day Program)? No   Name/Location of Program/Hospital:No data recorded  How Long Were You There? No data recorded  When Were You Discharged? No data recorded Have You Ever Received Services From [facility] Before? Yes   Who Do You See at [facility]? ED Visits  Have You Recently Had Any Thoughts About Hurting Yourself? Yes   Are You Planning to Commit Suicide/Harm Yourself At This time?  No  Have you Recently Had Thoughts About [place]? No   Explanation: No data recorded Have You Used Any Alcohol or Drugs in the Past 24 Hours? Yes   How Long Ago Did You Use Drugs or Alcohol?  1530   What Did You Use and How Much? 12 beers  What Do You Feel Would Help You the Most Today? Assessment Only  Do You Currently Have a Therapist/Psychiatrist?  No   Name of Therapist/Psychiatrist: No data recorded  Have You Been Recently Discharged From Any Office Practice or Programs? No   Explanation of Discharge From Practice/Program:  No data recorded    CCA Screening Triage Referral Assessment Type of Contact: Face-to-Face   Is this Initial or Reassessment? No data recorded  Date Telepsych consult ordered in [facility]:  04/22/20   Time Telepsych consult ordered in [facility]:  1856  Patient  Reported Information Reviewed? Yes   Patient Left Without Being Seen? No data recorded  Reason for Not Completing Assessment: No data recorded Collateral Involvement: N/A  Does Patient Have a Court Appointed Legal Guardian? No data recorded  Name and Contact of Legal Guardian:  Self.   If Minor and Not Living with Parent(s), Who has Custody? Self  Is CPS involved or ever been involved? Never  Is APS involved or ever been involved? Never  Patient Determined To Be At Risk for Harm To Self or Others Based on Review of Patient Reported Information or Presenting Complaint? No   Method: No data recorded  Availability of Means: No data recorded  Intent: No data recorded  Notification Required: No data recorded  Additional Information for Danger to Others Potential:  No data recorded  Additional Comments for Danger to Others Potential:  No data recorded  Are There Guns or Other Weapons in Your Home?  No data recorded   Types of Guns/Weapons: No data recorded   Are These Weapons Safely Secured?                              No data recorded   Who Could Verify You Are Able To Have These Secured:    No data recorded Do You Have any Outstanding Charges, Pending Court Dates, Parole/Probation? No data recorded Contacted To Inform of Risk of Harm To Self or Others: No data recorded Location of Assessment: [facility] Assessment Services  Does Patient Present under Involuntary Commitment? No   IVC Papers Initial File Date: No data recorded  [place] of Residence: [place]  Patient Currently Receiving the Following Services: Not Receiving Services   Determination of Need: Emergent (2 hours) (Emergent medical need - pt requires medical clearance prior to Res Tx referral)   Options For Referral: Other: Comment (ER transfer for medical clearance)   Fransico Meadow, [facility]

## 2020-05-05 NOTE — ED Provider Notes (Signed)
[name]   CSN: [id number] Arrival date & time: 05/05/20  1802     History Chief Complaint  Patient presents with  . Medical Clearance  . Alcohol Withdraw    Nicolas [name] is a 54 y.o. adult.  Patient is a 54 year old male with a history of alcohol abuse, depression, GERD and prior pancreatitis who presents today due to ongoing abdominal pain and feeling generally shaky.  Has been seen multiple times in the emergency room in the last few weeks for similar symptoms.  He actually was at behavioral health prior to arrival here.  Behavioral health monitored the patient and felt that he did not need inpatient admission and was given resources for outpatient residential detox and monitoring but they informed him to come to the emergency room because he was having severe abdominal pain.  Patient reports the abdominal pain is in his epigastric area and radiates into his chest.  It makes him feel nervous and anxious.  He denies any vomiting and reports he has been having formed stools.  He has no new lower abdominal pain but reports he is very concerned that it might be his pancreas.  He reports the pain started this morning.  He has had this pain before and is always related to his drinking.  He denies any fever cough or shortness of breath.  He states he is also feeling shaky and last had a drink about 7 or 8 hours ago.  He states this is exactly how he feels when he is withdrawing.  The history is provided by the patient.       Past Medical History:  Diagnosis Date  . Alcohol abuse   . Hypertension   . Pancreatitis     Patient Active Problem List   Diagnosis Date Noted  . GERD (gastroesophageal reflux disease) 11/03/2019  . Alcoholic hepatitis 11/02/2019  . Major depressive disorder, recurrent episode, severe (HCC) 10/21/2019  . 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 Acute pancreatitis 10/20/2019  . Alcohol use disorder, severe,  dependence (HCC) 10/16/2019    Past Surgical History:  Procedure Laterality Date  . NASAL SEPTUM SURGERY         History reviewed. No pertinent family history.  Social History   Tobacco Use  . Smoking status: Current Every Day Smoker    Packs/day: 0.50    Types: Cigarettes  . Smokeless tobacco: Never Used  Vaping Use  . Vaping Use: Never used  Substance Use Topics  . Alcohol use: Yes    Alcohol/week: 6.0 standard drinks    Types: 6 Cans of beer per week    Comment: daily use  . Drug use: Not Currently    Types: Cocaine    Comment: stopped 01/2019    Home Medications Prior to Admission medications   Medication Sig Start Date End Date Taking? Authorizing Provider  acamprosate (CAMPRAL) 333 MG tablet Take 666 mg by mouth 3 (three) times daily with meals.    [provider]  atenolol (TENORMIN) 25 MG tablet Take 25 mg by mouth daily. 08/31/19   [provider]  chlordiazePOXIDE (LIBRIUM) 25 MG capsule 50mg  PO TID x 1D, then 25-50mg  PO BID X 1D, then 25-50mg  PO QD X 1D Patient not taking: Reported on 04/21/2020 02/20/20   Geoffery Lyonselo, Douglas, MD  fenofibrate 160 MG tablet Take 160 mg by mouth daily.  11/22/19   [provider]  hydrOXYzine (VISTARIL) 25 MG capsule Take 25 mg by mouth in  the morning and at bedtime.    [provider]  omeprazole (PRILOSEC) 20 MG capsule Take 20 mg by mouth daily. 04/01/20   [provider]  ondansetron (ZOFRAN ODT) 4 MG disintegrating tablet Take 1 tablet (4 mg total) by mouth every 8 (eight) hours as needed for nausea or vomiting. Patient not taking: Reported on 04/21/2020 01/26/20   Little, Ambrose Finland, MD  pantoprazole (PROTONIX) 20 MG tablet Take 1 tablet (20 mg total) by mouth daily. Patient not taking: Reported on 01/26/2020 10/31/19 12/30/19  [name], Ines Bloomer C, PA-C  sucralfate (CARAFATE) 1 g tablet Take 1 tablet (1 g total) by mouth 4 (four) times daily -  with meals and at bedtime for 14 days. Patient taking  differently: Take 1 g by mouth daily.  10/31/19 04/21/20  [name], Ines Bloomer C, PA-C    Allergies    Caffeine and Bean pod extract  Review of Systems   Review of Systems  All other systems reviewed and are negative.   Physical Exam Updated Vital Signs BP (!) 137/94   Pulse 89   Temp 98.6 F (37 C) (Oral)   Resp 20   Ht 5\' 10"  (1.778 m)   Wt 86.2 kg   SpO2 96%   BMI 27.26 kg/m   Physical Exam Vitals and nursing note reviewed.  Constitutional:      General: Nicolas [name] is not in acute distress.    Appearance: Nicolas [name] is well-developed.     Comments: Anxious with mildly pressured speech  HENT:     Head: Normocephalic and atraumatic.     Mouth/Throat:     Mouth: Mucous membranes are moist.  Eyes:     Pupils: Pupils are equal, round, and reactive to light.  Cardiovascular:     Rate and Rhythm: Normal rate and regular rhythm.     Heart sounds: Normal heart sounds. No murmur heard.  No friction rub.  Pulmonary:     Effort: Pulmonary effort is normal.     Breath sounds: Normal breath sounds. No wheezing or rales.  Abdominal:     General: Bowel sounds are normal. There is no distension.     Palpations: Abdomen is soft.     Tenderness: There is abdominal tenderness in the epigastric area. There is no guarding or rebound.  Musculoskeletal:        General: No tenderness. Normal range of motion.     Right lower leg: No edema.     Left lower leg: No edema.     Comments: No edema  Skin:    General: Skin is warm and dry.     Findings: No rash.  Neurological:     General: No focal deficit present.     Mental Status: Nicolas [name] is alert and oriented to person, place, and time. Mental status is at baseline.     Cranial Nerves: No cranial nerve deficit.  Psychiatric:        Attention and Perception: Attention normal.        Mood and Affect: Mood is anxious.        Behavior: Behavior is hyperactive.     ED Results / Procedures / Treatments   Labs (all labs ordered are  listed, but only abnormal results are displayed) Labs Reviewed  COMPREHENSIVE METABOLIC PANEL - Abnormal; Notable for the following components:      Result Value   Glucose, Bld 111 (*)    BUN <5 (*)    AST 100 (*)  ALT 119 (*)    All other components within normal limits  ETHANOL - Abnormal; Notable for the following components:   Alcohol, Ethyl (B) 169 (*)    All other components within normal limits  SALICYLATE LEVEL - Abnormal; Notable for the following components:   Salicylate Lvl <7.0 (*)    All other components within normal limits  ACETAMINOPHEN LEVEL - Abnormal; Notable for the following components:   Acetaminophen (Tylenol), Serum <10 (*)    All other components within normal limits  CBC - Abnormal; Notable for the following components:   MCV 103.2 (*)    MCH 34.8 (*)    All other components within normal limits  RAPID URINE DRUG SCREEN, HOSP PERFORMED - Abnormal; Notable for the following components:   Benzodiazepines POSITIVE (*)    All other components within normal limits  LIPASE, BLOOD    EKG None  Radiology No results found.  Procedures Procedures (including critical care time)  Medications Ordered in ED Medications  chlordiazePOXIDE (LIBRIUM) capsule 25 mg (has no administration in time range)  lactated ringers bolus 1,000 mL (0 mLs Intravenous Stopped 05/05/20 2315)  pantoprazole (PROTONIX) injection 40 mg (40 mg Intravenous Given 05/05/20 2047)  alum & mag hydroxide-simeth (MAALOX/MYLANTA) 200-200-20 MG/5ML suspension 30 mL (30 mLs Oral Given 05/05/20 2046)    And  lidocaine (XYLOCAINE) 2 % viscous mouth solution 15 mL (15 mLs Oral Given 05/05/20 2046)  LORazepam (ATIVAN) tablet 2 mg (2 mg Oral Given 05/05/20 2046)  LORazepam (ATIVAN) injection 1 mg (1 mg Intravenous Given 05/05/20 2315)    ED Course  I have reviewed the triage vital signs and the nursing notes.  Pertinent labs & imaging results that were available during my care of the patient  were reviewed by me and considered in my medical decision making (see chart for details).    MDM Rules/Calculators/A&P                          Patient returning to the emergency room today after being discharged from behavioral health urgent care due to ongoing epigastric pain.  Patient reports this has been present all day.  The pain is radiating up into his chest and making him feel very nervous.  He has had pain like this in the past.  Patient is walking around the room and is hyperactive and slightly anxious but otherwise acting appropriately.  He has mild epigastric pain but low suspicion for perforation.  No symptoms to suggest appendicitis or diverticulitis.  Patient's labs came back with a normal lipase and mild elevation of LFTs but is most likely due to his alcohol use.  Things are improved from yesterday.  Alcohol level is 169.  Patient is mildly hypertensive but is not tachycardic.  He has minimal tremors on exam.  Suspect that patient has alcoholic gastritis.  He has been discharged from behavioral health and they recommended outpatient resources.  Will attempt to control patient's pain give IV fluids and Protonix and plan will be to discharge improved.  11:21 PM After first rounds of meds patient on reevaluation reports he is just feeling awful and very anxious.  He states he is terrified he is getting go home and drink.  Discussed with him that it is very important that he follow-up with outpatient resources but there is no particular reason to keep him in the emergency room.  He has been cleared by behavioral health.  Discussed with giving him 1  more dose of Ativan for his nerves and will give him an oral dose of hydroxyzine if there is any further anxiety but he does not appear to be withdrawing at this time.  After Ativan blood pressure and heart rate improved.  Low suspicion for perforation or acute abdominal issue other than gastritis.  11:54 PM  No vomiting here.  Pt is anxious but  discussed with him the importance of following up as an outpatient.  Patient was given a dose of Librium here.  He was given a tapering prescription.  He will call his insurance in the morning for outpatient residential detox.  Patient appears stable for discharge at this time.  MDM Number of Diagnoses or Management Options   Amount and/or Complexity of Data Reviewed Clinical lab tests: ordered and reviewed Decide to obtain previous medical records or to obtain history from someone other than the patient: yes Obtain history from someone other than the patient: yes Discuss the patient with other providers: yes  Risk of Complications, Morbidity, and/or Mortality Presenting problems: moderate Diagnostic procedures: low Management options: moderate  Patient Progress Patient progress: stable    Final Clinical Impression(s) / ED Diagnoses Final diagnoses:  Alcohol abuse  Anxiety    Rx / DC Orders ED Discharge Orders    None       Gwyneth Sprout, MD 05/05/20 2356

## 2020-05-09 ENCOUNTER — Other Ambulatory Visit: Payer: Self-pay

## 2020-05-09 ENCOUNTER — Inpatient Hospital Stay (HOSPITAL_COMMUNITY)
Admission: EM | Admit: 2020-05-09 | Discharge: 2020-05-12 | DRG: 439 | Disposition: A | Discharge status: Home / Self Care | Payer: 59 | Attending: Family Medicine | Admitting: Family Medicine

## 2020-05-09 ENCOUNTER — Encounter (HOSPITAL_COMMUNITY): Payer: Self-pay

## 2020-05-09 ENCOUNTER — Emergency Department (HOSPITAL_COMMUNITY): Payer: 59

## 2020-05-09 DIAGNOSIS — K852 Alcohol induced acute pancreatitis without necrosis or infection: Secondary | ICD-10-CM | POA: Diagnosis not present

## 2020-05-09 DIAGNOSIS — F10239 Alcohol dependence with withdrawal, unspecified: Secondary | ICD-10-CM | POA: Diagnosis present

## 2020-05-09 DIAGNOSIS — K7010 Alcoholic hepatitis without ascites: Secondary | ICD-10-CM | POA: Diagnosis present

## 2020-05-09 DIAGNOSIS — F1020 Alcohol dependence, uncomplicated: Secondary | ICD-10-CM | POA: Diagnosis not present

## 2020-05-09 DIAGNOSIS — Z20822 Contact with and (suspected) exposure to covid-19: Secondary | ICD-10-CM | POA: Diagnosis present

## 2020-05-09 DIAGNOSIS — K859 Acute pancreatitis without necrosis or infection, unspecified: Secondary | ICD-10-CM | POA: Diagnosis present

## 2020-05-09 DIAGNOSIS — E781 Pure hyperglyceridemia: Secondary | ICD-10-CM | POA: Diagnosis present

## 2020-05-09 DIAGNOSIS — I10 Essential (primary) hypertension: Secondary | ICD-10-CM | POA: Diagnosis present

## 2020-05-09 DIAGNOSIS — F17210 Nicotine dependence, cigarettes, uncomplicated: Secondary | ICD-10-CM | POA: Diagnosis present

## 2020-05-09 DIAGNOSIS — F10288 Alcohol dependence with other alcohol-induced disorder: Secondary | ICD-10-CM | POA: Diagnosis not present

## 2020-05-09 DIAGNOSIS — F32A Depression, unspecified: Secondary | ICD-10-CM | POA: Diagnosis present

## 2020-05-09 DIAGNOSIS — F419 Anxiety disorder, unspecified: Secondary | ICD-10-CM | POA: Diagnosis present

## 2020-05-09 DIAGNOSIS — K219 Gastro-esophageal reflux disease without esophagitis: Secondary | ICD-10-CM | POA: Diagnosis present

## 2020-05-09 LAB — URINALYSIS, ROUTINE W REFLEX MICROSCOPIC
Bilirubin Urine: NEGATIVE
Glucose, UA: NEGATIVE mg/dL
Hgb urine dipstick: NEGATIVE
Ketones, ur: NEGATIVE mg/dL
Leukocytes,Ua: NEGATIVE
Nitrite: NEGATIVE
Protein, ur: NEGATIVE mg/dL
Specific Gravity, Urine: 1.004 — ABNORMAL LOW (ref 1.005–1.030)
pH: 5 (ref 5.0–8.0)

## 2020-05-09 LAB — CBC WITH DIFFERENTIAL/PLATELET
Abs Immature Granulocytes: 0.07 10*3/uL (ref 0.00–0.07)
Basophils Absolute: 0.1 10*3/uL (ref 0.0–0.1)
Basophils Relative: 2 %
Eosinophils Absolute: 0.3 10*3/uL (ref 0.0–0.5)
Eosinophils Relative: 5 %
HCT: 43.6 % (ref 39.0–52.0)
Hemoglobin: 14.8 g/dL (ref 13.0–17.0)
Immature Granulocytes: 1 %
Lymphocytes Relative: 35 %
Lymphs Abs: 1.9 10*3/uL (ref 0.7–4.0)
MCH: 35 pg — ABNORMAL HIGH (ref 26.0–34.0)
MCHC: 33.9 g/dL (ref 30.0–36.0)
MCV: 103.1 fL — ABNORMAL HIGH (ref 80.0–100.0)
Monocytes Absolute: 0.6 10*3/uL (ref 0.1–1.0)
Monocytes Relative: 11 %
Neutro Abs: 2.6 10*3/uL (ref 1.7–7.7)
Neutrophils Relative %: 46 %
Platelets: 248 10*3/uL (ref 150–400)
RBC: 4.23 MIL/uL (ref 4.22–5.81)
RDW: 12.4 % (ref 11.5–15.5)
WBC: 5.6 10*3/uL (ref 4.0–10.5)
nRBC: 0 % (ref 0.0–0.2)

## 2020-05-09 LAB — RESP PANEL BY RT-PCR (FLU A&B, COVID) ARPGX2
Influenza A by PCR: NEGATIVE
Influenza B by PCR: NEGATIVE
SARS Coronavirus 2 by RT PCR: NEGATIVE

## 2020-05-09 LAB — COMPREHENSIVE METABOLIC PANEL
ALT: 82 U/L — ABNORMAL HIGH (ref 0–44)
AST: 81 U/L — ABNORMAL HIGH (ref 15–41)
Albumin: 4.5 g/dL (ref 3.5–5.0)
Alkaline Phosphatase: 61 U/L (ref 38–126)
Anion gap: 13 (ref 5–15)
BUN: <5 mg/dL — ABNORMAL LOW (ref 6–20)
CO2: 26 mmol/L (ref 22–32)
Calcium: 9.1 mg/dL (ref 8.9–10.3)
Chloride: 102 mmol/L (ref 98–111)
Creatinine, Ser: 0.69 mg/dL (ref 0.61–1.24)
GFR, Estimated: >60 mL/min (ref >60)
Glucose, Bld: 174 mg/dL — ABNORMAL HIGH (ref 70–99)
Potassium: 3.4 mmol/L — ABNORMAL LOW (ref 3.5–5.1)
Sodium: 141 mmol/L (ref 135–145)
Total Bilirubin: 0.5 mg/dL (ref 0.3–1.2)
Total Protein: 7.3 g/dL (ref 6.5–8.1)

## 2020-05-09 LAB — ETHANOL: Alcohol, Ethyl (B): 170 mg/dL — ABNORMAL HIGH (ref <10)

## 2020-05-09 LAB — PHOSPHORUS: Phosphorus: 3.6 mg/dL (ref 2.5–4.6)

## 2020-05-09 LAB — LIPASE, BLOOD: Lipase: 58 U/L — ABNORMAL HIGH (ref 11–51)

## 2020-05-09 LAB — MAGNESIUM: Magnesium: 1.6 mg/dL — ABNORMAL LOW (ref 1.7–2.4)

## 2020-05-09 MED ORDER — THIAMINE HCL 100 MG/ML IJ SOLN: 100.0000 mg | Freq: Every day | INTRAMUSCULAR | Status: DC

## 2020-05-09 MED ORDER — FENOFIBRATE 160 MG PO TABS
160.0000 mg | ORAL_TABLET | Freq: Every day | ORAL | Status: DC
  Administered 2020-05-10 – 2020-05-11 (×2): 160 mg via ORAL
  Filled 2020-05-09 (×2): qty 1

## 2020-05-09 MED ORDER — ADULT MULTIVITAMIN W/MINERALS CH
1.0000 | ORAL_TABLET | Freq: Every day | ORAL | Status: DC
  Administered 2020-05-10 – 2020-05-11 (×2): 1 via ORAL
  Filled 2020-05-09 (×2): qty 1

## 2020-05-09 MED ORDER — ONDANSETRON HCL 4 MG/2ML IJ SOLN
4.0000 mg | Freq: Once | INTRAMUSCULAR | Status: AC
  Administered 2020-05-09: 4 mg via INTRAVENOUS
  Filled 2020-05-09: qty 2

## 2020-05-09 MED ORDER — ADULT MULTIVITAMIN W/MINERALS CH
1.0000 | ORAL_TABLET | Freq: Every day | ORAL | Status: DC
  Administered 2020-05-09: 1 via ORAL
  Filled 2020-05-09: qty 1

## 2020-05-09 MED ORDER — LORAZEPAM 1 MG PO TABS
1.0000 mg | ORAL_TABLET | ORAL | Status: DC | PRN
  Administered 2020-05-09 (×2): 1 mg via ORAL
  Filled 2020-05-09 (×2): qty 1

## 2020-05-09 MED ORDER — IOHEXOL 300 MG/ML  SOLN: 80.0000 mL | Freq: Once | INTRAMUSCULAR | Status: DC | PRN

## 2020-05-09 MED ORDER — ONDANSETRON HCL 4 MG/2ML IJ SOLN
4.0000 mg | Freq: Four times a day (QID) | INTRAMUSCULAR | Status: DC | PRN
  Administered 2020-05-09 – 2020-05-11 (×3): 4 mg via INTRAVENOUS
  Filled 2020-05-09 (×3): qty 2

## 2020-05-09 MED ORDER — LORAZEPAM 1 MG PO TABS
1.0000 mg | ORAL_TABLET | ORAL | Status: DC | PRN
  Administered 2020-05-11 – 2020-05-12 (×3): 2 mg via ORAL
  Filled 2020-05-09 (×3): qty 2

## 2020-05-09 MED ORDER — HYDROCODONE-ACETAMINOPHEN 5-325 MG PO TABS
1.0000 | ORAL_TABLET | ORAL | Status: DC | PRN
  Administered 2020-05-10 – 2020-05-11 (×2): 1 via ORAL
  Filled 2020-05-09 (×2): qty 1

## 2020-05-09 MED ORDER — SODIUM CHLORIDE 0.9 % IV BOLUS
1000.0000 mL | Freq: Once | INTRAVENOUS | Status: AC
  Administered 2020-05-09: 1000 mL via INTRAVENOUS

## 2020-05-09 MED ORDER — PANTOPRAZOLE SODIUM 40 MG IV SOLR
40.0000 mg | Freq: Once | INTRAVENOUS | Status: AC
  Administered 2020-05-09: 40 mg via INTRAVENOUS
  Filled 2020-05-09: qty 40

## 2020-05-09 MED ORDER — ENOXAPARIN SODIUM 40 MG/0.4ML ~~LOC~~ SOLN
40.0000 mg | SUBCUTANEOUS | Status: DC
  Administered 2020-05-09: 40 mg via SUBCUTANEOUS
  Filled 2020-05-09: qty 0.4

## 2020-05-09 MED ORDER — LACTATED RINGERS IV SOLN: INTRAVENOUS | Status: AC

## 2020-05-09 MED ORDER — ONDANSETRON HCL 4 MG PO TABS
4.0000 mg | ORAL_TABLET | Freq: Four times a day (QID) | ORAL | Status: DC | PRN
  Administered 2020-05-11: 4 mg via ORAL
  Filled 2020-05-09: qty 1

## 2020-05-09 MED ORDER — THIAMINE HCL 100 MG PO TABS
100.0000 mg | ORAL_TABLET | Freq: Every day | ORAL | Status: DC
  Administered 2020-05-10 – 2020-05-11 (×2): 100 mg via ORAL
  Filled 2020-05-09 (×2): qty 1

## 2020-05-09 MED ORDER — ACETAMINOPHEN 325 MG PO TABS
650.0000 mg | ORAL_TABLET | Freq: Four times a day (QID) | ORAL | Status: DC | PRN
  Administered 2020-05-11 – 2020-05-12 (×2): 650 mg via ORAL
  Filled 2020-05-09 (×2): qty 2

## 2020-05-09 MED ORDER — HYDROMORPHONE HCL 1 MG/ML IJ SOLN
0.5000 mg | INTRAMUSCULAR | Status: DC | PRN
  Administered 2020-05-09 – 2020-05-10 (×4): 0.5 mg via INTRAVENOUS
  Filled 2020-05-09: qty 0.5
  Filled 2020-05-09: qty 1
  Filled 2020-05-09 (×2): qty 0.5

## 2020-05-09 MED ORDER — ACETAMINOPHEN 650 MG RE SUPP: 650.0000 mg | Freq: Four times a day (QID) | RECTAL | Status: DC | PRN

## 2020-05-09 MED ORDER — MORPHINE SULFATE (PF) 4 MG/ML IV SOLN
4.0000 mg | Freq: Once | INTRAVENOUS | Status: AC
  Administered 2020-05-09: 4 mg via INTRAVENOUS
  Filled 2020-05-09: qty 1

## 2020-05-09 MED ORDER — LORAZEPAM 2 MG/ML IJ SOLN
1.0000 mg | INTRAMUSCULAR | Status: DC | PRN
  Administered 2020-05-09 – 2020-05-11 (×11): 2 mg via INTRAVENOUS
  Administered 2020-05-11: 4 mg via INTRAVENOUS
  Administered 2020-05-11: 2 mg via INTRAVENOUS
  Filled 2020-05-09 (×12): qty 1
  Filled 2020-05-09: qty 2

## 2020-05-09 MED ORDER — LORAZEPAM 2 MG/ML IJ SOLN: 1.0000 mg | INTRAMUSCULAR | Status: DC | PRN

## 2020-05-09 MED ORDER — THIAMINE HCL 100 MG PO TABS
100.0000 mg | ORAL_TABLET | Freq: Every day | ORAL | Status: DC
  Administered 2020-05-09: 100 mg via ORAL
  Filled 2020-05-09: qty 1

## 2020-05-09 MED ORDER — IOHEXOL 300 MG/ML  SOLN
100.0000 mL | Freq: Once | INTRAMUSCULAR | Status: AC | PRN
  Administered 2020-05-09: 100 mL via INTRAVENOUS

## 2020-05-09 MED ORDER — HYDROMORPHONE HCL 1 MG/ML IJ SOLN
1.0000 mg | Freq: Once | INTRAMUSCULAR | Status: AC
  Administered 2020-05-09: 1 mg via INTRAVENOUS
  Filled 2020-05-09: qty 1

## 2020-05-09 MED ORDER — FOLIC ACID 1 MG PO TABS
1.0000 mg | ORAL_TABLET | Freq: Every day | ORAL | Status: DC
  Administered 2020-05-10 – 2020-05-11 (×2): 1 mg via ORAL
  Filled 2020-05-09 (×2): qty 1

## 2020-05-09 MED ORDER — FOLIC ACID 1 MG PO TABS
1.0000 mg | ORAL_TABLET | Freq: Every day | ORAL | Status: DC
  Administered 2020-05-09: 1 mg via ORAL
  Filled 2020-05-09: qty 1

## 2020-05-09 NOTE — ED Notes (Signed)
Pt keeps unhooking himself from monitor and walking around. Educated pt that he needs to stay in his room and on the monitors and let the IV continue. MD made aware

## 2020-05-09 NOTE — ED Notes (Signed)
Called lab for update on covid swab results. Will be another 22 minutes.

## 2020-05-09 NOTE — ED Triage Notes (Signed)
Patient reports abdominal pain. Is worried about pancreatitis. States liver hurts also. Patient states they went to [facility] and were sent over here a couple of days ago. Patient states they were supposed to go to [facility] but was discharged home. Says they have been drinking etoh, mouth is dry and not eating much food. Patient last drink 20 minutes ago. Pain rated 9/10. Patient states they are here because they are worried about liver and pancreas. Pain comes and goes.

## 2020-05-09 NOTE — H&P (Addendum)
History and Physical        Hospital Admission Note Date: 05/09/2020  Patient name: Nicolas [name] Medical record number: [id number] Date of birth: 11/09/1965 Age: 54 y.o. Gender: adult  PCP: Rankins, Fanny Dance, MD  Patient coming from: home   Chief Complaint    Chief Complaint  Patient presents with  . Abdominal Pain      HPI:   This is a 54 year old male with past medical history of alcohol abuse with recurrent relapses, alcoholic pancreatitis, recent voluntary psych hospitalization for suicidal ideation and also ED visit for pancreatitis who presents for abdominal pain, vomiting x 2 days.  His last drink was 20 minutes prior to arriving in the ED.  States that he drinks 10-12 40 ounce beers daily.  He is concerned about having recurrent relapses and is seeking help and to go to a rehab facility.  He is hoping to be hospitalized to get through his withdrawal as he feels he keeps getting sent back home and relapsing before he completes withdrawal.  Currently tolerating some clear liquids.  Patient has been continuing to complain of some anxiety and tremors no drug use.  Denies fever, chills, chest pain, urinary symptoms.  No known history of seizures or hallucinations.   ED Course: Afebrile, hemodynamically stable, on room air. Notable Labs: K3.4, glucose 174, lipase 58, AST 81, ALT 82, Alcohol 170. Notable Imaging: CT abdomen pelvis with acute pancreatitis. Patient received Dilaudid and Ativan per CIWA protocol with Zofran and Protonix and IV fluids.    Vitals:   05/09/20 1700 05/09/20 1730  BP: (!) 163/113 (!) 161/96  Pulse: 78 69  Resp:  14  Temp:    SpO2:  95%     Review of Systems:  Review of Systems  All other systems reviewed and are negative.   Medical/Social/Family History   Past Medical History: Past Medical History:  Diagnosis Date  . Alcohol abuse     . Hypertension   . Pancreatitis     Past Surgical History:  Procedure Laterality Date  . NASAL SEPTUM SURGERY      Medications: Prior to Admission medications   Medication Sig Start Date End Date Taking? Authorizing Provider  atenolol (TENORMIN) 25 MG tablet Take 25 mg by mouth daily. 08/31/19  Yes [provider]  fenofibrate 160 MG tablet Take 160 mg by mouth daily.  11/22/19  Yes [provider]  hydrOXYzine (VISTARIL) 25 MG capsule Take 25 mg by mouth in the morning and at bedtime.   Yes [provider]  omeprazole (PRILOSEC) 20 MG capsule Take 20 mg by mouth daily. 04/01/20  Yes [provider]  ondansetron (ZOFRAN ODT) 4 MG disintegrating tablet Take 1 tablet (4 mg total) by mouth every 8 (eight) hours as needed for nausea or vomiting. 01/26/20  Yes Little, Ambrose Finland, MD  sucralfate (CARAFATE) 1 g tablet Take 1 tablet (1 g total) by mouth 4 (four) times daily -  with meals and at bedtime for 14 days. Patient taking differently: Take 1 g by mouth daily.  10/31/19 05/09/20 Yes [name], Shawn C, PA-C  acamprosate (CAMPRAL) 333 MG tablet Take 666 mg by mouth 3 (three) times daily with meals. Patient not  taking: Reported on 05/09/2020    [provider]  chlordiazePOXIDE (LIBRIUM) 25 MG capsule 50mg  PO TID x 1D, then 25-50mg  PO BID X 1D, then 25-50mg  PO QD X 1D Patient not taking: Reported on 05/09/2020 05/05/20   05/07/20, MD  pantoprazole (PROTONIX) 20 MG tablet Take 1 tablet (20 mg total) by mouth daily. Patient not taking: Reported on 01/26/2020 10/31/19 12/30/19  01/01/20, PA-C    Allergies:   Allergies  Allergen Reactions  . Caffeine Palpitations and Anaphylaxis    Other reaction(s): Heart rhythm/rate changes Heart beat super fast and bean enzymes    . Bean Pod Extract Palpitations    Social History:  reports that Nicolas [name] has been smoking cigarettes. Nicolas [name] has been smoking about 0.50 packs per day.  Nicolas [name] has never used smokeless tobacco. Nicolas [name] reports current alcohol use of about 6.0 standard drinks of alcohol per week. Nicolas [name] reports previous drug use. Drug: Cocaine.  Family History: No family history on file.   Objective   Physical Exam: Blood pressure (!) 161/96, pulse 69, temperature 97.6 F (36.4 C), temperature source Oral, resp. rate 14, height 5\' 10"  (1.778 m), weight 86.2 kg, SpO2 95 %.  Physical Exam Vitals and nursing note reviewed.  Constitutional:      Appearance: Normal appearance.  HENT:     Head: Normocephalic and atraumatic.  Eyes:     Conjunctiva/sclera: Conjunctivae normal.  Cardiovascular:     Rate and Rhythm: Normal rate and regular rhythm.  Pulmonary:     Effort: Pulmonary effort is normal.     Breath sounds: Normal breath sounds.  Abdominal:     Tenderness: There is guarding.     Comments: Would not let me palpate his abdomen  Musculoskeletal:        General: No swelling or tenderness.  Skin:    Coloration: Skin is not jaundiced or pale.  Neurological:     Mental Status: Nicolas [name] is alert. Mental status is at baseline.  Psychiatric:        Mood and Affect: Mood is anxious.     Comments: Continues to walk around the unit     LABS on Admission: I have personally reviewed all the labs and imaging below    Basic Metabolic Panel: Recent Labs  Lab 05/05/20 1814 05/09/20 1113  NA 140 141  K 3.6 3.4*  CL 103 102  CO2 25 26  GLUCOSE 111* 174*  BUN <5* <5*  CREATININE 0.81 0.69  CALCIUM 9.8 9.1   Liver Function Tests: Recent Labs  Lab 05/05/20 1814 05/09/20 1113  AST 100* 81*  ALT 119* 82*  ALKPHOS 68 61  BILITOT 0.6 0.5  PROT 7.0 7.3  ALBUMIN 4.6 4.5   Recent Labs  Lab 05/05/20 1814 05/09/20 1113  LIPASE 27 58*   No results for input(s): AMMONIA in the last 168 hours. CBC: Recent Labs  Lab 05/05/20 1814 05/05/20 1814 05/09/20 1113  WBC 5.6  --  5.6  NEUTROABS  --   --  2.6  HGB 15.4  --   14.8  HCT 45.7  --  43.6  MCV 103.2*   < > 103.1*  PLT 270  --  248   < > = values in this interval not displayed.   Cardiac Enzymes: No results for input(s): CKTOTAL, CKMB, CKMBINDEX, TROPONINI in the last 168 hours. BNP: Invalid input(s): POCBNP CBG: No results for input(s): GLUCAP in the last 168 hours.  Radiological  Exams on Admission:  CT ABDOMEN PELVIS W CONTRAST  Result Date: 05/09/2020 CLINICAL DATA:  Abdominal pain with elevated serum lipase EXAM: CT ABDOMEN AND PELVIS WITH CONTRAST TECHNIQUE: Multidetector CT imaging of the abdomen and pelvis was performed using the standard protocol following bolus administration of intravenous contrast. CONTRAST:  100mL OMNIPAQUE IOHEXOL 300 MG/ML  SOLN COMPARISON:  Oct 20, 2019 FINDINGS: Lower chest: Lung bases are clear. Hepatobiliary: Liver measures 27.0 cm in length. There is diffuse hepatic steatosis. No focal liver lesions are evident. The gallbladder wall is not appreciably thickened. There is no biliary duct dilatation. Pancreas: There is relatively mild edema involving the uncinate process and a portion of the head of the pancreas. There is less edema in this area compared to prior CT. Pancreas otherwise appears unremarkable. Currently there is no appreciable pancreatic mass or pseudocyst. There is no pancreatic duct dilatation or calcification. No evident pancreatic necrosis. There is no peripancreatic fluid beyond minimal fluid immediately adjacent to the uncinate process of the pancreas. Spleen: No splenic lesions are evident. A small splenule is noted medial to the spleen inferiorly. Adrenals/Urinary Tract: Adrenals appear normal bilaterally. There is no evident renal mass or hydronephrosis on either side. There is no evident renal or ureteral calculus on either side. Urinary bladder is midline with wall thickness within normal limits. Stomach/Bowel: There is no appreciable bowel wall or mesenteric thickening. Terminal ileum appears normal.  There is no appreciable bowel obstruction. There is no evident free air or portal venous air. Vascular/Lymphatic: Aorta is mildly tortuous. No abdominal aortic aneurysm. No appreciable arterial vascular lesions evident. Major venous structures appear patent. There is no evident adenopathy in the abdomen or pelvis. Reproductive: Prostate and seminal vesicles are normal in size and contour. Other: There is a left inguinal hernia containing fat but no bowel. A loop of colon extends to the outer edge of this hernia but does not extend into this hernia. The appendix appears normal except for small appendicoliths in the distal appendix. No appendiceal thickening or dilatation evident. No periappendiceal inflammation. No ascites or abscess evident in the abdomen or pelvis. Musculoskeletal: No blastic or lytic bone lesions. No intramuscular lesions are evident. IMPRESSION: 1. Evidence of acute pancreatitis involving the uncinate process and a portion of the pancreatic head. Minimal fluid immediately adjacent to the uncinate process. No more distant fluid. No pancreatic mass or generalized edema. No calcification or necrosis involving the pancreas. No pancreatic duct dilatation. 2.  Hepatomegaly with hepatic steatosis. 3. No evident bowel obstruction. No abscess in the abdomen pelvis. No appendiceal inflammation. 4.  Small left inguinal hernia containing only fat. Electronically Signed   By: Bretta BangWilliam  Woodruff III M.D.   On: 05/09/2020 14:21      EKG: Independently reviewed   A & P   Principal Problem:   Acute alcoholic pancreatitis Active Problems:   Alcohol use disorder, severe, dependence (HCC)   Alcoholic hepatitis   1. Mild acute alcoholic pancreatitis a. Lipase 58 b. Pancreatitis noted on CT scan and has abdominal guarding on exam c. IV fluids pain control d. Clear liquid diet and advance as tolerated  2. Alcohol withdrawal  alcohol abuse a. CIWA protocol b. Patient is requesting to stay  hospitalized until he is through his withdrawal. c. He is seeking help for his alcohol abuse - TOC consult  3. Anxiety/depression a. Psychiatry consult  4. Mild alcoholic hepatitis a. Trend CMP    DVT prophylaxis: Lovenox   Code Status: Prior  Diet: Clear liquid  diet Family Communication: Admission, patients condition and plan of care including tests being ordered have been discussed with the patient who indicates understanding and agrees with the plan and Code Status.  Disposition Plan: The appropriate patient status for this patient is OBSERVATION. Observation status is judged to be reasonable and necessary in order to provide the required intensity of service to ensure the patient's safety. The patient's presenting symptoms, physical exam findings, and initial radiographic and laboratory data in the context of their medical condition is felt to place them at decreased risk for further clinical deterioration. Furthermore, it is anticipated that the patient will be medically stable for discharge from the hospital within 2 midnights of admission. The following factors support the patient status of observation.   " The patient's presenting symptoms include abdominal pain nausea. " The physical exam findings include abdominal guarding. " The initial radiographic and laboratory data are elevated lipase and.    Status is: Observation  The patient remains OBS appropriate and will d/c before 2 midnights.  Dispo: The patient is from: Home              Anticipated d/c is to: TBD              Anticipated d/c date is: 1 day              Patient currently is not medically stable to d/c.    Consultants  . psych  Procedures  . none  Time Spent on Admission: 63 minutes    Jae Dire, DO [facility]  05/09/2020, 6:27 PM

## 2020-05-09 NOTE — Plan of Care (Signed)

## 2020-05-09 NOTE — ED Provider Notes (Signed)
[facility]-EMERGENCY DEPT Provider Note   CSN: [id number] Arrival date & time: 05/09/20  1057     History Chief Complaint  Patient presents with  . Abdominal Pain    Nicolas DredgeJeffery [name] is a 54 y.o. adult without medical history significant for alcohol abuse, hypertension and pancreatitis.  HPI Patient presents to emergency department with chief complaint abdominal pain x2 days.  Patient states his pain is been intermittent.  Is located in his epigastric area and radiates to his back.  He describes the pain as sharp.  When present he rates the pain 9 of 10 in severity.  He is endorsing associated nausea without emesis.  He has been drinking for 2 days straight he says.  His last drink was 20 minutes ago. He had 1 episode of non bloody diarrhea prior to arrival. He states he typically will have diarrhea when drinking.   Denies any drug use. He denies fever, chills, chest pain, urinary symptoms, blood in stool. Denies suicidal or homicidal ideations.       Past Medical History:  Diagnosis Date  . Alcohol abuse   . Hypertension   . Pancreatitis     Patient Active Problem List   Diagnosis Date Noted  . GERD (gastroesophageal reflux disease) 11/03/2019  . Alcoholic hepatitis 11/02/2019  . Major depressive disorder, recurrent episode, severe (HCC) 10/21/2019  . 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 Acute pancreatitis 10/20/2019  . Alcohol use disorder, severe, dependence (HCC) 10/16/2019    Past Surgical History:  Procedure Laterality Date  . NASAL SEPTUM SURGERY         No family history on file.  Social History   Tobacco Use  . Smoking status: Current Every Day Smoker    Packs/day: 0.50    Types: Cigarettes  . Smokeless tobacco: Never Used  Vaping Use  . Vaping Use: Never used  Substance Use Topics  . Alcohol use: Yes    Alcohol/week: 6.0 standard drinks    Types: 6 Cans of beer per week    Comment: daily use  . Drug use: Not Currently     Types: Cocaine    Comment: stopped 01/2019    Home Medications Prior to Admission medications   Medication Sig Start Date End Date Taking? Authorizing Provider  atenolol (TENORMIN) 25 MG tablet Take 25 mg by mouth daily. 08/31/19  Yes [provider]  fenofibrate 160 MG tablet Take 160 mg by mouth daily.  11/22/19  Yes [provider]  hydrOXYzine (VISTARIL) 25 MG capsule Take 25 mg by mouth in the morning and at bedtime.   Yes [provider]  omeprazole (PRILOSEC) 20 MG capsule Take 20 mg by mouth daily. 04/01/20  Yes [provider]  ondansetron (ZOFRAN ODT) 4 MG disintegrating tablet Take 1 tablet (4 mg total) by mouth every 8 (eight) hours as needed for nausea or vomiting. 01/26/20  Yes Little, Ambrose Finlandachel Morgan, MD  sucralfate (CARAFATE) 1 g tablet Take 1 tablet (1 g total) by mouth 4 (four) times daily -  with meals and at bedtime for 14 days. Patient taking differently: Take 1 g by mouth daily.  10/31/19 05/09/20 Yes [name], Shawn C, PA-C  acamprosate (CAMPRAL) 333 MG tablet Take 666 mg by mouth 3 (three) times daily with meals.    [provider]  chlordiazePOXIDE (LIBRIUM) 25 MG capsule 50mg  PO TID x 1D, then 25-50mg  PO BID X 1D, then 25-50mg  PO QD X 1D 05/05/20   Gwyneth SproutPlunkett, Whitney, MD  pantoprazole (  PROTONIX) 20 MG tablet Take 1 tablet (20 mg total) by mouth daily. Patient not taking: Reported on 01/26/2020 10/31/19 12/30/19  Anselm Pancoast, PA-C    Allergies    Caffeine and Bean pod extract  Review of Systems   Review of Systems All other systems are reviewed and are negative for acute change except as noted in the HPI.  Physical Exam Updated Vital Signs BP (!) 145/95 (BP Location: Right Arm)   Pulse 77   Temp 97.6 F (36.4 C) (Oral)   Resp 15   Ht 5\' 10"  (1.778 m)   Wt 86.2 kg   SpO2 100%   BMI 27.26 kg/m   Physical Exam Vitals and nursing note reviewed.  Constitutional:      General: Nicolas [name] is not in acute distress.     Appearance: Nicolas [name] is not ill-appearing.  HENT:     Head: Normocephalic and atraumatic.     Right Ear: Tympanic membrane and external ear normal.     Left Ear: Tympanic membrane and external ear normal.     Nose: Nose normal.     Mouth/Throat:     Mouth: Mucous membranes are dry.     Pharynx: Oropharynx is clear.  Eyes:     General: No scleral icterus.       Right eye: No discharge.        Left eye: No discharge.     Extraocular Movements: Extraocular movements intact.     Conjunctiva/sclera: Conjunctivae normal.     Pupils: Pupils are equal, round, and reactive to light.  Neck:     Vascular: No JVD.  Cardiovascular:     Rate and Rhythm: Normal rate and regular rhythm.     Pulses: Normal pulses.          Radial pulses are 2+ on the right side and 2+ on the left side.     Heart sounds: Normal heart sounds.  Pulmonary:     Comments: Lungs clear to auscultation in all fields. Symmetric chest rise. No wheezing, rales, or rhonchi. Abdominal:     Tenderness: There is no right CVA tenderness or left CVA tenderness.     Comments: Abdomen is soft, non-distended. Tender to palpation in epigastric area and RUQ with voluntary guarding. No rigidity. No peritoneal signs.  Musculoskeletal:        General: Normal range of motion.     Cervical back: Normal range of motion.  Skin:    General: Skin is warm and dry.     Capillary Refill: Capillary refill takes less than 2 seconds.  Neurological:     Mental Status: Nicolas [name] is oriented to person, place, and time.     GCS: GCS eye subscore is 4. GCS verbal subscore is 5. GCS motor subscore is 6.     Comments: Fluent speech, no facial droop.  Psychiatric:        Behavior: Behavior normal.      ED Results / Procedures / Treatments   Labs (all labs ordered are listed, but only abnormal results are displayed) Labs Reviewed  CBC WITH DIFFERENTIAL/PLATELET - Abnormal; Notable for the following components:      Result Value   MCV  103.1 (*)    MCH 35.0 (*)    All other components within normal limits  COMPREHENSIVE METABOLIC PANEL - Abnormal; Notable for the following components:   Potassium 3.4 (*)    Glucose, Bld 174 (*)    BUN <5 (*)  AST 81 (*)    ALT 82 (*)    All other components within normal limits  LIPASE, BLOOD - Abnormal; Notable for the following components:   Lipase 58 (*)    All other components within normal limits  URINALYSIS, ROUTINE W REFLEX MICROSCOPIC - Abnormal; Notable for the following components:   Color, Urine STRAW (*)    APPearance CLOUDY (*)    Specific Gravity, Urine 1.004 (*)    All other components within normal limits  ETHANOL - Abnormal; Notable for the following components:   Alcohol, Ethyl (B) 170 (*)    All other components within normal limits  RESP PANEL BY RT-PCR (FLU A&B, COVID) [id number]    EKG EKG Interpretation  Date/Time:  Saturday May 09 2020 12:05:58 EST Ventricular Rate:  76 PR Interval:    QRS Duration: 106 QT Interval:  411 QTC Calculation: 463 R Axis:   -29 Text Interpretation: Sinus rhythm Borderline left axis deviation Confirmed by Bethann Berkshire [phone]) on 05/09/2020 2:28:09 PM   Radiology CT ABDOMEN PELVIS W CONTRAST  Result Date: 05/09/2020 CLINICAL DATA:  Abdominal pain with elevated serum lipase EXAM: CT ABDOMEN AND PELVIS WITH CONTRAST TECHNIQUE: Multidetector CT imaging of the abdomen and pelvis was performed using the standard protocol following bolus administration of intravenous contrast. CONTRAST:  OMNIPAQUE IOHEXOL 300 MG/ML  SOLN COMPARISON:  Oct 20, 2019 FINDINGS: Lower chest: Lung bases are clear. Hepatobiliary: Liver measures 27.0 cm in length. There is diffuse hepatic steatosis. No focal liver lesions are evident. The gallbladder wall is not appreciably thickened. There is no biliary duct dilatation. Pancreas: There is relatively mild edema involving the uncinate process and a portion of the head of the pancreas. There is  less edema in this area compared to prior CT. Pancreas otherwise appears unremarkable. Currently there is no appreciable pancreatic mass or pseudocyst. There is no pancreatic duct dilatation or calcification. No evident pancreatic necrosis. There is no peripancreatic fluid beyond minimal fluid immediately adjacent to the uncinate process of the pancreas. Spleen: No splenic lesions are evident. A small splenule is noted medial to the spleen inferiorly. Adrenals/Urinary Tract: Adrenals appear normal bilaterally. There is no evident renal mass or hydronephrosis on either side. There is no evident renal or ureteral calculus on either side. Urinary bladder is midline with wall thickness within normal limits. Stomach/Bowel: There is no appreciable bowel wall or mesenteric thickening. Terminal ileum appears normal. There is no appreciable bowel obstruction. There is no evident free air or portal venous air. Vascular/Lymphatic: Aorta is mildly tortuous. No abdominal aortic aneurysm. No appreciable arterial vascular lesions evident. Major venous structures appear patent. There is no evident adenopathy in the abdomen or pelvis. Reproductive: Prostate and seminal vesicles are normal in size and contour. Other: There is a left inguinal hernia containing fat but no bowel. A loop of colon extends to the outer edge of this hernia but does not extend into this hernia. The appendix appears normal except for small appendicoliths in the distal appendix. No appendiceal thickening or dilatation evident. No periappendiceal inflammation. No ascites or abscess evident in the abdomen or pelvis. Musculoskeletal: No blastic or lytic bone lesions. No intramuscular lesions are evident. IMPRESSION: 1. Evidence of acute pancreatitis involving the uncinate process and a portion of the pancreatic head. Minimal fluid immediately adjacent to the uncinate process. No more distant fluid. No pancreatic mass or generalized edema. No calcification or  necrosis involving the pancreas. No pancreatic duct dilatation. 2.  Hepatomegaly with hepatic steatosis.  3. No evident bowel obstruction. No abscess in the abdomen pelvis. No appendiceal inflammation. 4.  Small left inguinal hernia containing only fat. Electronically Signed   By: Bretta Bang III M.D.   On: 05/09/2020 14:21    Procedures Procedures (including critical care time)  Medications Ordered in ED Medications  iohexol (OMNIPAQUE) 300 MG/ML solution 80 mL (has no administration in time range)  LORazepam (ATIVAN) tablet 1-4 mg (has no administration in time range)    Or  LORazepam (ATIVAN) injection 1-4 mg (has no administration in time range)  thiamine tablet 100 mg (has no administration in time range)    Or  thiamine (B-1) injection 100 mg (has no administration in time range)  folic acid (FOLVITE) tablet 1 mg (has no administration in time range)  multivitamin with minerals tablet 1 tablet (has no administration in time range)  ondansetron (ZOFRAN) injection 4 mg (4 mg Intravenous Given 05/09/20 1157)  morphine 4 MG/ML injection 4 mg (4 mg Intravenous Given 05/09/20 1158)  sodium chloride 0.9 % bolus 1,000 mL (0 mLs Intravenous Stopped 05/09/20 1313)  pantoprazole (PROTONIX) injection 40 mg (40 mg Intravenous Given 05/09/20 1204)  sodium chloride 0.9 % bolus 1,000 mL (1,000 mLs Intravenous New Bag/Given 05/09/20 1324)  HYDROmorphone (DILAUDID) injection 1 mg (1 mg Intravenous Given 05/09/20 1322)  ondansetron (ZOFRAN) injection 4 mg (4 mg Intravenous Given 05/09/20 1322)  iohexol (OMNIPAQUE) 300 MG/ML solution 100 mL (100 mLs Intravenous Contrast Given 05/09/20 1354)    ED Course  I have reviewed the triage vital signs and the nursing notes.  Pertinent labs & imaging results that were available during my care of the patient were reviewed by me and considered in my medical decision making (see chart for details).    MDM Rules/Calculators/A&P                           History provided by patient with additional history obtained from chart review.    Patient presents to the ED with complaints of abdominal pain. Patient nontoxic appearing, in no apparent distress, vitals WNL. On exam patient tender to epigastric area, no peritoneal signs.  He looks dehydrated, mucous membranes are extremely dry.  History of pancreatitis.  In the setting of recent and frequent alcohol consumption could be the cause of his pain again today.  CBC overall unremarkable.  CMP without significant left light derangement, no renal insufficiency, does have transaminitis with an AST of 81 and ALT of 82, this has decreased compared to when he had labs checked 4 days ago.  His lipase is elevated at 58 and his alcohol is 170.  UA is without signs of infection. CIWA protocol ordered. Patient given dose of pain and nausea medicine with 1 L NS. On reassessment he abdomen is still tender to palpation. CT AP obtained and shows acute pancreatitis. Second liter of IVF ordered.  Spoke with Dr. Dairl Ponder with hospitalist service who agrees to assume care of patient and bring into the hospital for further evaluation and management.     Portions of this note were generated with Scientist, clinical (histocompatibility and immunogenetics). Dictation errors may occur despite best attempts at proofreading.     Final Clinical Impression(s) / ED Diagnoses Final diagnoses:  Alcohol-induced acute pancreatitis, unspecified complication status    Rx / DC Orders ED Discharge Orders    None       Kandice Hams 05/09/20 1509    Bethann Berkshire, MD 05/10/20  0725  

## 2020-05-10 ENCOUNTER — Observation Stay (HOSPITAL_COMMUNITY): Payer: 59

## 2020-05-10 DIAGNOSIS — F10288 Alcohol dependence with other alcohol-induced disorder: Secondary | ICD-10-CM | POA: Diagnosis not present

## 2020-05-10 DIAGNOSIS — K219 Gastro-esophageal reflux disease without esophagitis: Secondary | ICD-10-CM | POA: Diagnosis present

## 2020-05-10 DIAGNOSIS — F32A Depression, unspecified: Secondary | ICD-10-CM | POA: Diagnosis present

## 2020-05-10 DIAGNOSIS — E781 Pure hyperglyceridemia: Secondary | ICD-10-CM | POA: Diagnosis present

## 2020-05-10 DIAGNOSIS — I10 Essential (primary) hypertension: Secondary | ICD-10-CM | POA: Diagnosis present

## 2020-05-10 DIAGNOSIS — K859 Acute pancreatitis without necrosis or infection, unspecified: Secondary | ICD-10-CM | POA: Diagnosis present

## 2020-05-10 DIAGNOSIS — F10239 Alcohol dependence with withdrawal, unspecified: Secondary | ICD-10-CM | POA: Diagnosis present

## 2020-05-10 DIAGNOSIS — K852 Alcohol induced acute pancreatitis without necrosis or infection: Secondary | ICD-10-CM | POA: Diagnosis present

## 2020-05-10 DIAGNOSIS — F419 Anxiety disorder, unspecified: Secondary | ICD-10-CM | POA: Diagnosis present

## 2020-05-10 DIAGNOSIS — F17210 Nicotine dependence, cigarettes, uncomplicated: Secondary | ICD-10-CM | POA: Diagnosis present

## 2020-05-10 DIAGNOSIS — Z20822 Contact with and (suspected) exposure to covid-19: Secondary | ICD-10-CM | POA: Diagnosis present

## 2020-05-10 DIAGNOSIS — K7010 Alcoholic hepatitis without ascites: Secondary | ICD-10-CM | POA: Diagnosis present

## 2020-05-10 LAB — CBC
HCT: 40.9 % (ref 39.0–52.0)
Hemoglobin: 14 g/dL (ref 13.0–17.0)
MCH: 35.4 pg — ABNORMAL HIGH (ref 26.0–34.0)
MCHC: 34.2 g/dL (ref 30.0–36.0)
MCV: 103.3 fL — ABNORMAL HIGH (ref 80.0–100.0)
Platelets: 211 10*3/uL (ref 150–400)
RBC: 3.96 MIL/uL — ABNORMAL LOW (ref 4.22–5.81)
RDW: 12.3 % (ref 11.5–15.5)
WBC: 6.3 10*3/uL (ref 4.0–10.5)
nRBC: 0 % (ref 0.0–0.2)

## 2020-05-10 LAB — BASIC METABOLIC PANEL
Anion gap: 10 (ref 5–15)
BUN: <5 mg/dL — ABNORMAL LOW (ref 6–20)
CO2: 29 mmol/L (ref 22–32)
Calcium: 8.7 mg/dL — ABNORMAL LOW (ref 8.9–10.3)
Chloride: 100 mmol/L (ref 98–111)
Creatinine, Ser: 0.78 mg/dL (ref 0.61–1.24)
GFR, Estimated: >60 mL/min (ref >60)
Glucose, Bld: 88 mg/dL (ref 70–99)
Potassium: 3.7 mmol/L (ref 3.5–5.1)
Sodium: 139 mmol/L (ref 135–145)

## 2020-05-10 LAB — HEPATIC FUNCTION PANEL
ALT: 67 U/L — ABNORMAL HIGH (ref 0–44)
AST: 62 U/L — ABNORMAL HIGH (ref 15–41)
Albumin: 3.9 g/dL (ref 3.5–5.0)
Alkaline Phosphatase: 57 U/L (ref 38–126)
Bilirubin, Direct: 0.1 mg/dL (ref 0.0–0.2)
Indirect Bilirubin: 0.7 mg/dL (ref 0.3–0.9)
Total Bilirubin: 0.8 mg/dL (ref 0.3–1.2)
Total Protein: 6.4 g/dL — ABNORMAL LOW (ref 6.5–8.1)

## 2020-05-10 LAB — MAGNESIUM: Magnesium: 1.8 mg/dL (ref 1.7–2.4)

## 2020-05-10 MED ORDER — SERTRALINE HCL 25 MG PO TABS
25.0000 mg | ORAL_TABLET | Freq: Every day | ORAL | Status: DC
  Administered 2020-05-11: 25 mg via ORAL
  Filled 2020-05-10: qty 1

## 2020-05-10 MED ORDER — HYDROMORPHONE HCL 1 MG/ML IJ SOLN
0.5000 mg | INTRAMUSCULAR | Status: DC | PRN
  Administered 2020-05-10 – 2020-05-11 (×3): 0.5 mg via INTRAVENOUS
  Filled 2020-05-10 (×3): qty 0.5

## 2020-05-10 NOTE — Consult Note (Signed)
Nicolas [name], Nicolas [name], Nicolas (HCC)   Alcoholic hepatitis   Total Time spent with patient: 15 minutes  Subjective:   Nicolas [name] is a 54 y.o. adult was seen and evaluated face-to-face.  He is awake, alert and oriented x3.  Denying suicidal or homicidal ideations.  Denied visual hallucinations does report " vivid dreams/sounds."  That can be heard throughout the day.  Denies auditory hallucinations are command in nature.  Patient reports multiple stressors related to Covid and financial. Claud reported " I keep relapsing, and I have attempted to follow-up for detox and rehabilitation without success." denied previous inpatient admissions.  Does rate his depression 8 out of 10 with 10 being the worst.  Discussed initiating Zoloft for mood stabilization/depression.  Patient was receptive to plan discussed following up with intensive outpatient programming/and/or chemical dependency prograafter discussed chart. Patient reports he has been followed by AA and [facility], however stated he has not followed up.   Reports fair appetite.  States his sleep has been sporadic.  Reports undiagnosed family history with depression: Mother-. case staffed with attending psychiatrist Lucianne Muss.  Support, encouragement reassurance was provided.  HPI: Per admission assessment note: patient presents to emergency department with chief complaint abdominal pain x2 days.  Patient states his pain is been intermittent.  Is located in his epigastric area and radiates to his back.  He describes the pain as sharp.  When present he rates the pain 9 of 10 in severity.  He is endorsing associated nausea without emesis.  He has been  drinking for 2 days straight he says.  His last drink was 20 minutes ago. He had 1 episode of non bloody diarrhea prior to arrival. He states he typically will have diarrhea when drinking.   Denies any drug use. He denies fever, chills, chest pain, urinary symptoms, blood in stool. Denies suicidal or homicidal ideations.   Past Psychiatric History:  Risk to Self:   Risk to Others:   Prior Inpatient Therapy:   Prior Outpatient Therapy:    Past Medical History:  Past Medical History:  Diagnosis Date  . Alcohol abuse   . Hypertension   . Pancreatitis     Past Surgical History:  Procedure Laterality Date  . NASAL SEPTUM SURGERY     Family History: History reviewed. No pertinent family history. Family Psychiatric  History:  Social History:  Social History   Substance and Sexual Activity  Alcohol Use Yes  . Alcohol/week: 6.0 standard drinks  . Types: 6 Cans of beer per week   Comment: daily use     Social History   Substance and Sexual Activity  Drug Use Not Currently  . Types: Cocaine   Comment: stopped 01/2019    Social History   Socioeconomic History  . Marital status: Single    Spouse name: Not on file  . Number of children: Not on file  . Years of education: Not on file  . Highest education level: Not on file  Occupational History  . Not on file  Tobacco Use  . Smoking status: Current Every Day Smoker    Packs/day: 0.50    Types: Cigarettes  . Smokeless tobacco: Never Used  Vaping Use  . Vaping Use: Never used  Substance and Sexual Activity  .  Alcohol use: Yes    Alcohol/week: 6.0 standard drinks    Types: 6 Cans of beer per week    Comment: daily use  . Drug use: Not Currently    Types: Cocaine    Comment: stopped 01/2019  . Sexual activity: Not on file  Other Topics Concern  . Not on file  Social History Narrative  . Not on file   Social Determinants of Health   Financial Resource Strain:   . Difficulty of Paying Living Expenses: Not on file   Food Insecurity:   . Worried About Programme researcher, broadcasting/film/video in the Last Year: Not on file  . Ran Out of Food in the Last Year: Not on file  Transportation Needs: No Transportation Needs  . Lack of Transportation (Medical): No  . Lack of Transportation (Non-Medical): No  Physical Activity:   . Days of Exercise per Week: Not on file  . Minutes of Exercise per Session: Not on file  Stress:   . Feeling of Stress : Not on file  Social Connections:   . Frequency of Communication with Friends and Family: Not on file  . Frequency of Social Gatherings with Friends and Family: Not on file  . Attends Religious Services: Not on file  . Active Member of Clubs or Organizations: Not on file  . Attends Banker Meetings: Not on file  . Marital Status: Not on file   Additional Social History:    Allergies:   Allergies  Allergen Reactions  . Caffeine Palpitations and Anaphylaxis    Other reaction(s): Heart rhythm/rate changes Heart beat super fast and bean enzymes    . Bean Pod Extract Palpitations    Labs:  Results for orders placed or performed during the hospital encounter of 05/09/20 (from the past 48 hour(s))  CBC with Differential     Status: Abnormal   Collection Time: 05/09/20 11:13 AM  Result Value Ref Range   WBC 5.6 4.0 - 10.5 K/uL   RBC 4.23 4.22 - 5.81 MIL/uL   Hemoglobin 14.8 13.0 - 17.0 g/dL   HCT 52.7 39 - 52 %   MCV 103.1 (H) 80.0 - 100.0 fL   MCH 35.0 (H) 26.0 - 34.0 pg   MCHC 33.9 30.0 - [name].0 g/dL   RDW 78.2 42.3 - 53.6 %   Platelets 248 150 - 400 K/uL   nRBC 0.0 0.0 - 0.2 %   Neutrophils Relative % 46 %   Neutro Abs 2.6 1.7 - 7.7 K/uL   Lymphocytes Relative 35 %   Lymphs Abs 1.9 0.7 - 4.0 K/uL   Monocytes Relative 11 %   Monocytes Absolute 0.6 0.1 - 1.0 K/uL   Eosinophils Relative 5 %   Eosinophils Absolute 0.3 0.0 - 0.5 K/uL   Basophils Relative 2 %   Basophils Absolute 0.1 0.0 - 0.1 K/uL   Immature Granulocytes 1 %   Abs Immature Granulocytes  0.07 0.00 - 0.07 K/uL    Comment: Performed at [facility], [address] [place], [place] [address]  Comprehensive metabolic panel     Status: Abnormal   Collection Time: 05/09/20 11:13 AM  Result Value Ref Range   Sodium 141 135 - 145 mmol/L   Potassium 3.4 (L) 3.5 - 5.1 mmol/L   Chloride 102 98 - 111 mmol/L   CO2 26 22 - 32 mmol/L   Glucose, Bld 174 (H) 70 - 99 mg/dL    Comment: Glucose reference range applies only to samples  taken after fasting for at least 8 hours.   BUN <5 (L) 6 - 20 mg/dL   Creatinine, Ser 0.62 0.61 - 1.24 mg/dL   Calcium 9.1 8.9 - 69.4 mg/dL   Total Protein 7.3 6.5 - 8.1 g/dL   Albumin 4.5 3.5 - 5.0 g/dL   AST 81 (H) 15 - 41 U/L   ALT 82 (H) 0 - 44 U/L   Alkaline Phosphatase 61 38 - 126 U/L   Total Bilirubin 0.5 0.3 - 1.2 mg/dL   GFR, Estimated >85 >46 mL/min    Comment: (NOTE) Calculated using the CKD-EPI Creatinine Equation (2021)    Anion gap 13 5 - 15    Comment: Performed at [facility], [address] [place], [place] [address]  Lipase, blood     Status: Abnormal   Collection Time: 05/09/20 11:13 AM  Result Value Ref Range   Lipase 58 (H) 11 - 51 U/L    Comment: Performed at [facility], [address] [place], [place] [address]  Urinalysis, Routine w reflex microscopic Urine, Clean Catch     Status: Abnormal   Collection Time: 05/09/20 11:35 AM  Result Value Ref Range   Color, Urine STRAW (A) YELLOW   APPearance CLOUDY (A) CLEAR   Specific Gravity, Urine 1.004 (L) 1.005 - 1.030   pH 5.0 5.0 - 8.0   Glucose, UA NEGATIVE NEGATIVE mg/dL   Hgb urine dipstick NEGATIVE NEGATIVE   Bilirubin Urine NEGATIVE NEGATIVE   Ketones, ur NEGATIVE NEGATIVE mg/dL   Protein, ur NEGATIVE NEGATIVE mg/dL   Nitrite NEGATIVE NEGATIVE   Leukocytes,Ua NEGATIVE NEGATIVE    Comment: Performed at [facility], [address] [place], [place] [address]  Ethanol     Status: Abnormal    Collection Time: 05/09/20 11:35 AM  Result Value Ref Range   Alcohol, Ethyl (B) 170 (H) <10 mg/dL    Comment: (NOTE) Lowest detectable limit for serum alcohol is 10 mg/dL.  For medical purposes only. Performed at [facility], [address], [place], [place] [address]   Resp Panel by RT-PCR (Flu A&B, Covid) Nasopharyngeal Swab     Status: None   Collection Time: 05/09/20  5:39 PM   Specimen: Nasopharyngeal Swab; Nasopharyngeal(NP) swabs in vial transport medium  Result Value Ref Range   SARS Coronavirus 2 by RT PCR NEGATIVE NEGATIVE    Comment: (NOTE) SARS-CoV-2 target nucleic acids are NOT DETECTED.  The SARS-CoV-2 RNA is generally detectable in upper respiratory specimens during the acute phase of infection. The lowest concentration of SARS-CoV-2 viral copies this assay can detect is 138 copies/mL. A negative result does not preclude SARS-Cov-2 infection and should not be used as the sole basis for treatment or other patient management decisions. A negative result may occur with  improper specimen collection/handling, submission of specimen other than nasopharyngeal swab, presence of viral mutation(s) within the areas targeted by this assay, and inadequate number of viral copies(<138 copies/mL). A negative result must be combined with clinical observations, patient history, and epidemiological information. The expected result is Negative.  Fact Sheet for Patients:  BloggerCourse.com  Fact Sheet for Healthcare Providers:  SeriousBroker.it  This test is no t yet approved or cleared by the [place] FDA and  has been authorized for detection and/or diagnosis of SARS-CoV-2 by FDA under an Emergency Use Authorization (EUA). This EUA will remain  in effect (meaning this test can be used) for the duration of the COVID-19 declaration  under Section 564(b)(1) of the Act, 21 U.S.C.section 360bbb-3(b)(1), unless  the authorization is terminated  or revoked sooner.       Influenza A by PCR NEGATIVE NEGATIVE   Influenza B by PCR NEGATIVE NEGATIVE    Comment: (NOTE) The Xpert Xpress SARS-CoV-2/FLU/RSV plus assay is intended as an aid in the diagnosis of influenza from Nasopharyngeal swab specimens and should not be used as a sole basis for treatment. Nasal washings and aspirates are unacceptable for Xpert Xpress SARS-CoV-2/FLU/RSV testing.  Fact Sheet for Patients: BloggerCourse.comhttps://www.fda.gov/media/152166/download  Fact Sheet for Healthcare Providers: SeriousBroker.ithttps://www.fda.gov/media/152162/download  This test is not yet approved or cleared by the [place] FDA and has been authorized for detection and/or diagnosis of SARS-CoV-2 by FDA under an Emergency Use Authorization (EUA). This EUA will remain in effect (meaning this test can be used) for the duration of the COVID-19 declaration under Section 564(b)(1) of the Act, 21 U.S.C. section 360bbb-3(b)(1), unless the authorization is terminated or revoked.  Performed at [facility], [address] [place], [place] [address]   Magnesium     Status: Abnormal   Collection Time: 05/09/20  8:26 PM  Result Value Ref Range   Magnesium 1.6 (L) 1.7 - 2.4 mg/dL    Comment: Performed at [facility], [address] [place], [place] [address]  Phosphorus     Status: None   Collection Time: 05/09/20  8:26 PM  Result Value Ref Range   Phosphorus 3.6 2.5 - 4.6 mg/dL    Comment: Performed at [facility], [address] [place], [place] [address]  Hepatic function panel     Status: Abnormal   Collection Time: 05/10/20  4:08 AM  Result Value Ref Range   Total Protein 6.4 (L) 6.5 - 8.1 g/dL   Albumin 3.9 3.5 - 5.0 g/dL   AST 62 (H) 15 - 41 U/L   ALT 67 (H) 0 - 44 U/L   Alkaline Phosphatase 57 38 - 126 U/L   Total Bilirubin 0.8 0.3 - 1.2 mg/dL   Bilirubin, Direct 0.1 0.0 - 0.2 mg/dL   Indirect  Bilirubin 0.7 0.3 - 0.9 mg/dL    Comment: Performed at [facility], [address] [place], [place] [address]  Basic metabolic panel     Status: Abnormal   Collection Time: 05/10/20  4:08 AM  Result Value Ref Range   Sodium 139 135 - 145 mmol/L   Potassium 3.7 3.5 - 5.1 mmol/L   Chloride 100 98 - 111 mmol/L   CO2 29 22 - 32 mmol/L   Glucose, Bld 88 70 - 99 mg/dL    Comment: Glucose reference range applies only to samples taken after fasting for at least 8 hours.   BUN <5 (L) 6 - 20 mg/dL   Creatinine, Ser 2.130.78 0.61 - 1.24 mg/dL   Calcium 8.7 (L) 8.9 - 10.3 mg/dL   GFR, Estimated >08>60 >65>60 mL/min    Comment: (NOTE) Calculated using the CKD-EPI Creatinine Equation (2021)    Anion gap 10 5 - 15    Comment: Performed at [facility], [address] [place], [place] [address]  CBC     Status: Abnormal   Collection Time: 05/10/20  4:08 AM  Result Value Ref Range   WBC 6.3 4.0 - 10.5 K/uL   RBC 3.96 (L) 4.22 - 5.81 MIL/uL   Hemoglobin 14.0 13.0 - 17.0 g/dL   HCT 62.940.9 39 - 52 %   MCV  103.3 (H) 80.0 - 100.0 fL   MCH 35.4 (H) 26.0 - 34.0 pg   MCHC 34.2 30.0 - [name].0 g/dL   RDW 16.1 09.6 - 04.5 %   Platelets 211 150 - 400 K/uL   nRBC 0.0 0.0 - 0.2 %    Comment: Performed at [facility] [place] [facility], [address] [place], [place] [address]  Magnesium     Status: None   Collection Time: 05/10/20  4:08 AM  Result Value Ref Range   Magnesium 1.8 1.7 - 2.4 mg/dL    Comment: Performed at [facility], [address] [place], [place] [address]    Current Facility-Administered Medications  Medication Dose Route Frequency Provider Last Rate Last Admin  . acetaminophen (TYLENOL) tablet 650 mg  650 mg Oral Q6H PRN Jae Dire, MD       Or  . acetaminophen (TYLENOL) suppository 650 mg  650 mg Rectal Q6H PRN Jae Dire, MD      . enoxaparin (LOVENOX) injection 40 mg  40 mg Subcutaneous Q24H Jae Dire, MD   40 mg at  05/09/20 2040  . fenofibrate tablet 160 mg  160 mg Oral Daily Jae Dire, MD      . folic acid (FOLVITE) tablet 1 mg  1 mg Oral Daily Jae Dire, MD      . HYDROcodone-acetaminophen (NORCO/VICODIN) 5-325 MG per tablet 1 tablet  1 tablet Oral Q4H PRN Jae Dire, MD      . HYDROmorphone (DILAUDID) injection 0.5 mg  0.5 mg Intravenous Q2H PRN Jae Dire, MD   0.5 mg at 05/10/20 8295  . iohexol (OMNIPAQUE) 300 MG/ML solution 80 mL  80 mL Intravenous Once PRN Jae Dire, MD      . lactated ringers infusion   Intravenous Continuous Jae Dire, MD 125 mL/hr at 05/09/20 2200 Rate Verify at 05/09/20 2200  . LORazepam (ATIVAN) tablet 1-4 mg  1-4 mg Oral Q1H PRN Jae Dire, MD       Or  . LORazepam (ATIVAN) injection 1-4 mg  1-4 mg Intravenous Q1H PRN Jae Dire, MD   2 mg at 05/10/20 0834  . multivitamin with minerals tablet 1 tablet  1 tablet Oral Daily Jae Dire, MD      . ondansetron [facility]) tablet 4 mg  4 mg Oral Q6H PRN Jae Dire, MD       Or  . ondansetron [facility]) injection 4 mg  4 mg Intravenous Q6H PRN Jae Dire, MD   4 mg at 05/09/20 2321  . thiamine tablet 100 mg  100 mg Oral Daily Jae Dire, MD       Or  . thiamine (B-1) injection 100 mg  100 mg Intravenous Daily Jae Dire, MD        Musculoskeletal: Strength & Muscle Tone: within normal limits Gait & Station: Observed resting in bed Patient leans: N/A  Psychiatric Specialty Exam: Physical Exam Vitals reviewed.  Psychiatric:        Mood and Affect: Mood normal.        Thought Content: Thought content normal.     Review of Systems  Psychiatric/Behavioral: Negative for suicidal ideas. The patient is nervous/anxious.   All other systems reviewed and are negative.   Blood pressure (!) 171/96, pulse 88, temperature 98.6 F (37 C), resp. rate 18, height  (1.778 m), weight 86.2 kg, SpO2 96 %.Body mass index is 27.26  kg/m.  General Appearance: Casual  Eye Contact:   Minimal  Speech:  Clear and Coherent  Volume:  Normal  Mood:  Anxious and Depressed  Affect:  Congruent  Thought Process:  Coherent  Orientation:  Full (Time, Place, and Person)  Thought Content:  Rumination  Suicidal Thoughts:  No  Homicidal Thoughts:  No  Memory:  Immediate;   Fair Remote;   Fair  Judgement:  Fair  Insight:  Fair  Psychomotor Activity:  NA  Concentration:  Concentration: Fair  Recall:  Good  Fund of Knowledge:  Fair  Language:  Good  Akathisia:  No  Handed:  Right  AIMS (if indicated):     Assets:  Communication Skills Desire for Improvement Resilience Social Support  ADL's:  Intact  Cognition:  WNL  Sleep:        Treatment Plan Summary: Daily contact with patient to assess and evaluate symptoms and progress in treatment and Medication management   -Consider initiating Zoloft 25 mg p.o. daily  -CSW to provide additional outpatient resources to include intensive outpatient programming and  chemical dependency outpatient program (CD-IOP)  Disposition: No evidence of imminent risk to self or others at present.   Patient does not meet criteria for psychiatric inpatient admission. Supportive therapy provided about ongoing stressors. Refer to IOP. Discussed crisis plan, support from social network, calling 911, coming to the Emergency Department, and calling Suicide Hotline.   Thank you for contacting [facility]  Oneta Rack, NP 05/10/2020 11:22 AM

## 2020-05-10 NOTE — Progress Notes (Signed)
Nurse Tech was in the patients room helping him to the bathroom when he mentioned wanting to kill himself.  She reported to me the following. "I should have just put a bullet in my head".  She asked if he has family, he said "what little family I have I hate. I dont even have a small dog".  He asked her "do you ever just want to die?  Well I do, I should have died".  "I know all this is because of my choices, but why should I pay a doctor to sail around on a boat by paying $2000 for a test" Dr Lowell Guitar was notified and he came to the patients room to talk to him.

## 2020-05-10 NOTE — Plan of Care (Signed)

## 2020-05-10 NOTE — Progress Notes (Signed)
Patient sleeping at this time, no acute distress, respirations even and unlabored, iv fluids infusing well, patient prone at this time, will continue to monitor.

## 2020-05-10 NOTE — Progress Notes (Addendum)
PROGRESS NOTE    Nicolas [name]  ZOX:[id number] DOB: 1965-07-20 DOA: 05/09/2020 PCP: Clayborn Heron, MD   Chief Complaint  Patient presents with  . Abdominal Pain    Brief Narrative:  This is Nicolas [name] 54 year old with Nicolas [name] past medical history of alcohol abuse with recurrent relapses, alcoholic pancreatitis, recent voluntary psych hospitalization for suicidal ideation and also ED visit for pancreatitis who presents for abdominal pain, vomiting x 2 days.  Last drink was 20 minutes prior to arriving in the ED.  They drink 10-12 40 ounce beers daily.  Concerned about having recurrent relapses and is seeking help and to go to [facility].  Admitted for etoh withdrawal and pancreatitis.  Assessment & Plan:   Principal Problem:   Acute alcoholic pancreatitis Active Problems:   Alcohol use disorder, severe, dependence (HCC)   Alcoholic hepatitis  1. Mild acute alcoholic pancreatitis Nicolas [name]. Lipase 58 b. Follow triglycerides and RUQ [place] c. Pancreatitis on CT, abdominal pain seems to be improving today d. IV fluids pain control e. Will advance to soft diet today  2. Alcohol withdrawal  alcohol abuse Nicolas [name]. CIWA protocol b. Last drink yesterday, continue to monitor c. seeking help for alcohol abuse - TOC consult  3. Anxiety/depression Nicolas [name]. Psychiatry consult -recommending zoloft 25 mg daily - social work for resources  4. Mild alcoholic hepatitis Nicolas [name]. Trend CMP  DVT prophylaxis: lovenox Code Status: full  Family Communication: none at bedside Disposition:   Status is: Observation  The patient will require care spanning > 2 midnights and should be moved to inpatient because: Inpatient level of care appropriate due to severity of illness  Dispo: The patient is from: Home              Anticipated d/c is to: Home              Anticipated d/c date is: > 3 days              Patient currently is not medically stable to d/c.       Consultants:   none  Procedures:   none  Antimicrobials:  Anti-infectives (From admission, onward)   None        Subjective: Concerned about withdrawal   Objective: Vitals:   05/10/20 0615 05/10/20 0825 05/10/20 1250 05/10/20 1545  BP:      Pulse: 84 88 75 82  Resp:      Temp:      TempSrc:      SpO2:      Weight:      Height:        Intake/Output Summary (Last 24 hours) at 05/10/2020 1754 Last data filed at 05/10/2020 0900 Gross per 24 hour  Intake 584.99 ml  Output --  Net 584.99 ml   Filed Weights   05/09/20 1110  Weight: 86.2 kg    Examination:  General exam: Appears calm and comfortable  Respiratory system: Clear to auscultation. Respiratory effort normal. Cardiovascular system: S1 & S2 heard, RRR. Marland Kitchen Gastrointestinal system: Abdomen is nondistended, soft and nontender.  Central nervous system: Alert and oriented. No focal neurological deficits. Extremities: Symmetric 5 x 5 power. Skin: No rashes, lesions or ulcers Psychiatry: Judgement and insight appear normal. Mood & affect appropriate.     Data Reviewed: I have personally reviewed following labs and imaging studies  CBC: Recent Labs  Lab 05/03/20 2317 05/05/20 1814 05/09/20 1113 05/10/20 0408  WBC 6.7 5.6 5.6 6.3  NEUTROABS 2.4  --  2.6  --  HGB 15.0 15.4 14.8 14.0  HCT 44.0 45.7 43.6 40.9  MCV 103.8* 103.2* 103.1* 103.3*  PLT 261 270 248 211    Basic Metabolic Panel: Recent Labs  Lab 05/03/20 2201 05/05/20 1814 05/09/20 1113 05/09/20 2026 05/10/20 0408  NA 143 140 141  --  139  K 3.6 3.6 3.4*  --  3.7  CL 105 103 102  --  100  CO2 24 25 26   --  29  GLUCOSE 121* 111* 174*  --  88  BUN <5* <5* <5*  --  <5*  CREATININE 0.81 0.81 0.69  --  0.78  CALCIUM 9.6 9.8 9.1  --  8.7*  MG  --   --   --  1.6* 1.8  PHOS  --   --   --  3.6  --     GFR: Estimated Creatinine Clearance (by C-G formula based on SCr of 0.78 mg/dL) Male: 78.2 mL/min Male: 112.8 mL/min  Liver Function Tests: Recent Labs  Lab  05/03/20 2201 05/05/20 1814 05/09/20 1113 05/10/20 0408  AST 119* 100* 81* 62*  ALT 121* 119* 82* 67*  ALKPHOS 58 68 61 57  BILITOT 0.4 0.6 0.5 0.8  PROT 7.1 7.0 7.3 6.4*  ALBUMIN 4.6 4.6 4.5 3.9    CBG: No results for input(s): GLUCAP in the last 168 hours.   Recent Results (from the past 240 hour(s))  Resp Panel by RT-PCR (Flu Arrionna Serena&B, Covid) Nasopharyngeal Swab     Status: None   Collection Time: 05/04/20  8:04 AM   Specimen: Nasopharyngeal Swab; Nasopharyngeal(NP) swabs in vial transport medium  Result Value Ref Range Status   SARS Coronavirus 2 by RT PCR NEGATIVE NEGATIVE Final    Comment: (NOTE) SARS-CoV-2 target nucleic acids are NOT DETECTED.  The SARS-CoV-2 RNA is generally detectable in upper respiratory specimens during the acute phase of infection. The lowest concentration of SARS-CoV-2 viral copies this assay can detect is 138 copies/mL. Moxie Kalil negative result does not preclude SARS-Cov-2 infection and should not be used as the sole basis for treatment or other patient management decisions. Chrystle Murillo negative result may occur with  improper specimen collection/handling, submission of specimen other than nasopharyngeal swab, presence of viral mutation(s) within the areas targeted by this assay, and inadequate number of viral copies(<138 copies/mL). Aydenn Gervin negative result must be combined with clinical observations, patient history, and epidemiological information. The expected result is Negative.  Fact Sheet for Patients:  BloggerCourse.com  Fact Sheet for Healthcare Providers:  SeriousBroker.it  This test is no t yet approved or cleared by the [place] FDA and  has been authorized for detection and/or diagnosis of SARS-CoV-2 by FDA under an Emergency Use Authorization (EUA). This EUA will remain  in effect (meaning this test can be used) for the duration of the COVID-19 declaration under Section 564(b)(1) of the Act,  21 U.S.C.section 360bbb-3(b)(1), unless the authorization is terminated  or revoked sooner.       Influenza Semiyah Newgent by PCR NEGATIVE NEGATIVE Final   Influenza B by PCR NEGATIVE NEGATIVE Final    Comment: (NOTE) The Xpert Xpress SARS-CoV-2/FLU/RSV plus assay is intended as an aid in the diagnosis of influenza from Nasopharyngeal swab specimens and should not be used as Steffie Waggoner sole basis for treatment. Nasal washings and aspirates are unacceptable for Xpert Xpress SARS-CoV-2/FLU/RSV testing.  Fact Sheet for Patients: BloggerCourse.com  Fact Sheet for Healthcare Providers: SeriousBroker.it  This test is not yet approved or cleared by the [place] FDA and has  been authorized for detection and/or diagnosis of SARS-CoV-2 by FDA under an Emergency Use Authorization (EUA). This EUA will remain in effect (meaning this test can be used) for the duration of the COVID-19 declaration under Section 564(b)(1) of the Act, 21 U.S.C. section 360bbb-3(b)(1), unless the authorization is terminated or revoked.  Performed at [facility], [address] [place], [place] [address]   Resp Panel by RT-PCR (Flu Imanol Bihl&B, Covid) Nasopharyngeal Swab     Status: None   Collection Time: 05/09/20  5:39 PM   Specimen: Nasopharyngeal Swab; Nasopharyngeal(NP) swabs in vial transport medium  Result Value Ref Range Status   SARS Coronavirus 2 by RT PCR NEGATIVE NEGATIVE Final    Comment: (NOTE) SARS-CoV-2 target nucleic acids are NOT DETECTED.  The SARS-CoV-2 RNA is generally detectable in upper respiratory specimens during the acute phase of infection. The lowest concentration of SARS-CoV-2 viral copies this assay can detect is 138 copies/mL. Lopez Dentinger negative result does not preclude SARS-Cov-2 infection and should not be used as the sole basis for treatment or other patient management decisions. Danyele Smejkal negative result may occur with  improper specimen  collection/handling, submission of specimen other than nasopharyngeal swab, presence of viral mutation(s) within the areas targeted by this assay, and inadequate number of viral copies(<138 copies/mL). Jermale Crass negative result must be combined with clinical observations, patient history, and epidemiological information. The expected result is Negative.  Fact Sheet for Patients:  BloggerCourse.com  Fact Sheet for Healthcare Providers:  SeriousBroker.it  This test is no t yet approved or cleared by the [place] FDA and  has been authorized for detection and/or diagnosis of SARS-CoV-2 by FDA under an Emergency Use Authorization (EUA). This EUA will remain  in effect (meaning this test can be used) for the duration of the COVID-19 declaration under Section 564(b)(1) of the Act, 21 U.S.C.section 360bbb-3(b)(1), unless the authorization is terminated  or revoked sooner.       Influenza Siddhanth Denk by PCR NEGATIVE NEGATIVE Final   Influenza B by PCR NEGATIVE NEGATIVE Final    Comment: (NOTE) The Xpert Xpress SARS-CoV-2/FLU/RSV plus assay is intended as an aid in the diagnosis of influenza from Nasopharyngeal swab specimens and should not be used as Parilee Hally sole basis for treatment. Nasal washings and aspirates are unacceptable for Xpert Xpress SARS-CoV-2/FLU/RSV testing.  Fact Sheet for Patients: BloggerCourse.com  Fact Sheet for Healthcare Providers: SeriousBroker.it  This test is not yet approved or cleared by the [place] FDA and has been authorized for detection and/or diagnosis of SARS-CoV-2 by FDA under an Emergency Use Authorization (EUA). This EUA will remain in effect (meaning this test can be used) for the duration of the COVID-19 declaration under Section 564(b)(1) of the Act, 21 U.S.C. section 360bbb-3(b)(1), unless the authorization is terminated or revoked.  Performed at [facility], [address] [place], [place] [address]          Radiology Studies: CT ABDOMEN PELVIS W CONTRAST  Result Date: 05/09/2020 CLINICAL DATA:  Abdominal pain with elevated serum lipase EXAM: CT ABDOMEN AND PELVIS WITH CONTRAST TECHNIQUE: Multidetector CT imaging of the abdomen and pelvis was performed using the standard protocol following bolus administration of intravenous contrast. CONTRAST:  OMNIPAQUE IOHEXOL 300 MG/ML  SOLN COMPARISON:  Oct 20, 2019 FINDINGS: Lower chest: Lung bases are clear. Hepatobiliary: Liver measures 27.0 cm in length. There is diffuse hepatic steatosis. No focal liver lesions are evident. The gallbladder wall is not appreciably thickened. There is no biliary  duct dilatation. Pancreas: There is relatively mild edema involving the uncinate process and Estalene Bergey portion of the head of the pancreas. There is less edema in this area compared to prior CT. Pancreas otherwise appears unremarkable. Currently there is no appreciable pancreatic mass or pseudocyst. There is no pancreatic duct dilatation or calcification. No evident pancreatic necrosis. There is no peripancreatic fluid beyond minimal fluid immediately adjacent to the uncinate process of the pancreas. Spleen: No splenic lesions are evident. Luma Clopper small splenule is noted medial to the spleen inferiorly. Adrenals/Urinary Tract: Adrenals appear normal bilaterally. There is no evident renal mass or hydronephrosis on either side. There is no evident renal or ureteral calculus on either side. Urinary bladder is midline with wall thickness within normal limits. Stomach/Bowel: There is no appreciable bowel wall or mesenteric thickening. Terminal ileum appears normal. There is no appreciable bowel obstruction. There is no evident free air or portal venous air. Vascular/Lymphatic: Aorta is mildly tortuous. No abdominal aortic aneurysm. No appreciable arterial vascular lesions evident. Major venous structures appear  patent. There is no evident adenopathy in the abdomen or pelvis. Reproductive: Prostate and seminal vesicles are normal in size and contour. Other: There is Hendy Brindle left inguinal hernia containing fat but no bowel. Yasmene Salomone loop of colon extends to the outer edge of this hernia but does not extend into this hernia. The appendix appears normal except for small appendicoliths in the distal appendix. No appendiceal thickening or dilatation evident. No periappendiceal inflammation. No ascites or abscess evident in the abdomen or pelvis. Musculoskeletal: No blastic or lytic bone lesions. No intramuscular lesions are evident. IMPRESSION: 1. Evidence of acute pancreatitis involving the uncinate process and Kynsley Whitehouse portion of the pancreatic head. Minimal fluid immediately adjacent to the uncinate process. No more distant fluid. No pancreatic mass or generalized edema. No calcification or necrosis involving the pancreas. No pancreatic duct dilatation. 2.  Hepatomegaly with hepatic steatosis. 3. No evident bowel obstruction. No abscess in the abdomen pelvis. No appendiceal inflammation. 4.  Small left inguinal hernia containing only fat. Electronically Signed   By: Bretta Bang III M.D.   On: 05/09/2020 14:21        Scheduled Meds: . enoxaparin (LOVENOX) injection  40 mg Subcutaneous Q24H  . fenofibrate  160 mg Oral Daily  . folic acid  1 mg Oral Daily  . multivitamin with minerals  1 tablet Oral Daily  . sertraline  25 mg Oral Daily  . thiamine  100 mg Oral Daily   Or  . thiamine  100 mg Intravenous Daily   Continuous Infusions: . lactated ringers 125 mL/hr at 05/10/20 1132     LOS: 0 days    Time spent: over 30 min    Lacretia Nicks, MD [facility]   To contact the attending provider between 7A-7P or the covering provider during after hours 7P-7A, please log into the web site [email or web address] and access using universal [place] password for that web site. If you do not have the password, please  call the hospital operator.  05/10/2020, 5:54 PM

## 2020-05-10 NOTE — Progress Notes (Signed)
Patient slept for short amount of time, keeps waking up stating "dreams are too vivid and I am hearing music playing", frequent calls on the call bell (at one point called and said "Help me, I've fallen and I can't get up"...patient was safely in bed at that time), also now complaining of nausea and a moderate headache, upon reassessment of the CIWA scale, score noted to be 16, patient very shaky upon ambulating to the bathroom, encouraged to stay in bed and utilize urinal, also now stating he has abdominal pain, medicated per mar for pain, nausea, and withdrawal symptoms, will re-evaluate in 1 hour per order, will continue to monitor, bed alarm remains on.

## 2020-05-10 NOTE — Progress Notes (Signed)
CIWA score 6.  Pt declined ativan. Asked for pain meds instead.

## 2020-05-10 NOTE — Progress Notes (Signed)
Patient arrived to unit via wheelchair, able to ambulate to bed, gait somewhat unsteady, speech hyperactive, behavior impulsive, CIWA score is 13, medicated with prn ativan per mar, patient very intent on getting help to quit drinking, states he was supposed to go to [facility] for rehab but was sent home from  a few days ago, voiding in urinal but also wanting to go into the bathroom at times, IV fluids hooked up now, no complaints of abdominal pain at this time, +bsx4, abd obese, left inguinal hernia noted (can be felt with patient coughs) bed locked and low, bed alarm on due to "forgetfulness" of patient to call for assistance oob, will continue to monitor and re-evaluate for ciwa protocol effectiveness.

## 2020-05-11 ENCOUNTER — Inpatient Hospital Stay (HOSPITAL_COMMUNITY): Payer: 59

## 2020-05-11 ENCOUNTER — Other Ambulatory Visit (HOSPITAL_COMMUNITY): Payer: 59

## 2020-05-11 DIAGNOSIS — K852 Alcohol induced acute pancreatitis without necrosis or infection: Secondary | ICD-10-CM | POA: Diagnosis not present

## 2020-05-11 LAB — CBC WITH DIFFERENTIAL/PLATELET
Abs Immature Granulocytes: 0.03 10*3/uL (ref 0.00–0.07)
Basophils Absolute: 0.1 10*3/uL (ref 0.0–0.1)
Basophils Relative: 2 %
Eosinophils Absolute: 0.4 10*3/uL (ref 0.0–0.5)
Eosinophils Relative: 10 %
HCT: 42.7 % (ref 39.0–52.0)
Hemoglobin: 14.5 g/dL (ref 13.0–17.0)
Immature Granulocytes: 1 %
Lymphocytes Relative: 33 %
Lymphs Abs: 1.3 10*3/uL (ref 0.7–4.0)
MCH: 35.3 pg — ABNORMAL HIGH (ref 26.0–34.0)
MCHC: 34 g/dL (ref 30.0–36.0)
MCV: 103.9 fL — ABNORMAL HIGH (ref 80.0–100.0)
Monocytes Absolute: 0.3 10*3/uL (ref 0.1–1.0)
Monocytes Relative: 8 %
Neutro Abs: 1.9 10*3/uL (ref 1.7–7.7)
Neutrophils Relative %: 46 %
Platelets: 194 10*3/uL (ref 150–400)
RBC: 4.11 MIL/uL — ABNORMAL LOW (ref 4.22–5.81)
RDW: 12.1 % (ref 11.5–15.5)
WBC: 4 10*3/uL (ref 4.0–10.5)
nRBC: 0 % (ref 0.0–0.2)

## 2020-05-11 LAB — COMPREHENSIVE METABOLIC PANEL
ALT: 59 U/L — ABNORMAL HIGH (ref 0–44)
AST: 62 U/L — ABNORMAL HIGH (ref 15–41)
Albumin: 4 g/dL (ref 3.5–5.0)
Alkaline Phosphatase: 66 U/L (ref 38–126)
Anion gap: 11 (ref 5–15)
BUN: 6 mg/dL (ref 6–20)
CO2: 25 mmol/L (ref 22–32)
Calcium: 9.6 mg/dL (ref 8.9–10.3)
Chloride: 101 mmol/L (ref 98–111)
Creatinine, Ser: 0.79 mg/dL (ref 0.61–1.24)
GFR, Estimated: >60 mL/min (ref >60)
Glucose, Bld: 112 mg/dL — ABNORMAL HIGH (ref 70–99)
Potassium: 3.9 mmol/L (ref 3.5–5.1)
Sodium: 137 mmol/L (ref 135–145)
Total Bilirubin: 1 mg/dL (ref 0.3–1.2)
Total Protein: 6.7 g/dL (ref 6.5–8.1)

## 2020-05-11 LAB — PHOSPHORUS: Phosphorus: 3.9 mg/dL (ref 2.5–4.6)

## 2020-05-11 LAB — TRIGLYCERIDES: Triglycerides: 1048 mg/dL — ABNORMAL HIGH (ref <150)

## 2020-05-11 LAB — MAGNESIUM: Magnesium: 1.9 mg/dL (ref 1.7–2.4)

## 2020-05-11 MED ORDER — DIPHENHYDRAMINE HCL 50 MG/ML IJ SOLN: 25.0000 mg | Freq: Once | INTRAMUSCULAR | Status: DC

## 2020-05-11 MED ORDER — SUCRALFATE 1 G PO TABS
1.0000 g | ORAL_TABLET | Freq: Three times a day (TID) | ORAL | Status: DC
  Administered 2020-05-11 – 2020-05-12 (×4): 1 g via ORAL
  Filled 2020-05-11 (×4): qty 1

## 2020-05-11 MED ORDER — HYDROXYZINE HCL 10 MG/5ML PO SYRP
25.0000 mg | ORAL_SOLUTION | Freq: Three times a day (TID) | ORAL | Status: DC | PRN
  Administered 2020-05-11: 25 mg via ORAL
  Filled 2020-05-11 (×3): qty 12.5

## 2020-05-11 MED ORDER — NICOTINE 14 MG/24HR TD PT24
14.0000 mg | MEDICATED_PATCH | Freq: Every day | TRANSDERMAL | Status: DC
  Administered 2020-05-11 (×2): 14 mg via TRANSDERMAL
  Filled 2020-05-11 (×2): qty 1

## 2020-05-11 NOTE — Progress Notes (Signed)
Patient unable to stay in bed, very unsteady on his feet, wanting to leave and drive himself home, just received multiple IV medications in the last 20 minutes, page sent to might md to come see patient, may need to get ivc papers, awaiting call back.

## 2020-05-11 NOTE — Progress Notes (Signed)
Patient has been between 12-15 on the CIWA scale this shift for anxiety, nausea, headache, agitation, tremors, hallucinations, and sweats, medicated according to order, patient non-compliant with being NPO after midnight for RUQ ultrasound in the morning, states he is refusing the scan, states he cannot afford it, states that when he quits drinking, all of his problems go away including the abdominal pain, sitter at bedside, suicide precautions in place, will continue to monitor.

## 2020-05-11 NOTE — TOC Initial Note (Signed)
Transition of Care [facility]) - Initial/Assessment Note    Patient Details  Name: Coleton Woon MRN: [id number] Date of Birth: March 13, 1966  [facility] CM/SW Contact:    Lennart Pall, LCSW Phone Number: 05/11/2020, 4:41 PM  Clinical Narrative:         Met with pt to follow up on orders to assist with SA resources and possible residential treatment per pt request.  Pt fully oriented and able to engage with me to confirm he would like assistance with inpatient ETOH treatment.  He admits that his attempts to find inpatient treatment programs have been frustrating and is not very optimistic that we will be able to help.   Discussed with pt that, given he does have insurance coverage, the [facility] programs are not an option.  Explained that there are a few in this area that accept insurance and I could reach out to them on his behalf.  He is agreeable.  (of note, he has already reached out to [facility] and knows that they are not in - network with insurance).   At this time I have contacted 3 programs:  [facility]   This program may be able to offer a spot for pt and they are in network with insurance. I have started a referral with ref# [id number]. Clinicals sent to attn: Olivia to fax # [phone] Direct ph # to program is [facility] @ [phone] - left voicemail requesting call back   [facility] @ [phone] - provided pt's information and contact information.  They will follow up with pt or myself if they can proceed and need clinicals.  Pt is also working on "other options".  TOC will continue to follow. Anticipate may be medically ready for dc in the next day.    Expected Discharge Plan: Home/Self Care (vs inpatient SA program) Barriers to Discharge: Continued Medical Work up   Patient Goals and CMS Choice Patient states their goals for this hospitalization and ongoing recovery are:: to enter  residential SA program if possible      Expected Discharge Plan and Services Expected Discharge Plan: Home/Self Care (vs inpatient SA program)       Living arrangements for the past 2 months: Single Family Home                                      Prior Living Arrangements/Services Living arrangements for the past 2 months: Single Family Home Lives with:: Self Patient language and need for interpreter reviewed:: Yes Do you feel safe going back to the place where you live?: No   concerned he will resume ETOH use  Need for Family Participation in Patient Care: No (Comment) Care giver support system in place?: No (comment)   Criminal Activity/Legal Involvement Pertinent to Current Situation/Hospitalization: No - Comment as needed  Activities of Daily Living Home Assistive Devices/Equipment: None ADL Screening (condition at time of admission) Patient's cognitive ability adequate to safely complete daily activities?: Yes Is the patient deaf or have difficulty hearing?: No Does the patient have difficulty seeing, even when wearing glasses/contacts?: No Does the patient have difficulty concentrating, remembering, or making decisions?: Yes Patient able to express need for assistance with ADLs?: Yes Does the patient have difficulty dressing or bathing?: No Independently performs ADLs?: Yes (appropriate for developmental age) Does the patient have difficulty  walking or climbing stairs?: No Weakness of Legs: None Weakness of Arms/Hands: None  Permission Sought/Granted Permission sought to share information with : Chartered certified accountant granted to share information with : Yes, Verbal Permission Granted     Permission granted to share info w AGENCY: SA residential programs        Emotional Assessment Appearance:: Appears stated age Attitude/Demeanor/Rapport: Engaged, Guarded Affect (typically observed): Frustrated Orientation: : Oriented to Self,  Oriented to Place, Oriented to  Time, Oriented to Situation Alcohol / Substance Use: Not Applicable, Alcohol Use Psych Involvement: Yes (comment)  Admission diagnosis:  Acute alcoholic pancreatitis [Y61.68] Alcohol-induced acute pancreatitis, unspecified complication status [H72.90] Pancreatitis [K85.90] Patient Active Problem List   Diagnosis Date Noted  . Pancreatitis 05/10/2020  . Acute alcoholic pancreatitis 21/04/5519  . GERD (gastroesophageal reflux disease) 11/03/2019  . Alcoholic hepatitis 80/22/3361  . Major depressive disorder, recurrent episode, severe ([place]) 10/21/2019  . Alcoholic pancreatitis 22/44/9753  . Acute pancreatitis 10/20/2019  . Alcohol use disorder, severe, dependence ([place]) 10/16/2019   PCP:  Aretta Nip, MD Pharmacy:   [id number]: [phone]: [phone]    Social Determinants of Health (SDOH) Interventions    Readmission Risk Interventions Readmission Risk Prevention Plan 11/04/2019  Transportation Screening Complete  [place] or [place]  Social Work Consult for RFaulkPlanning/Counseling Complete  Palliative Care Screening Not Applicable  Medication Review (Press photographer Complete

## 2020-05-11 NOTE — Progress Notes (Signed)
PROGRESS NOTE    Nicolas [name]  XBM:[id number] DOB: April 09, 1966 DOA: 05/09/2020 PCP: Clayborn Heron, MD   Chief Complaint  Patient presents with  . Abdominal Pain    Brief Narrative:  This is Nicolas [name] 54 year old with Nicolas [name] past medical history of alcohol abuse with recurrent relapses, alcoholic pancreatitis, recent voluntary psych hospitalization for suicidal ideation and also ED visit for pancreatitis who presents for abdominal pain, vomiting x 2 days.  Last drink was 20 minutes prior to arriving in the ED.  They drink 10-12 40 ounce beers daily.  Concerned about having recurrent relapses and is seeking help and to go to [facility].  Admitted for etoh withdrawal and pancreatitis.  Assessment & Plan:   Principal Problem:   Acute alcoholic pancreatitis Active Problems:   Alcohol use disorder, severe, dependence (HCC)   Alcoholic hepatitis   Pancreatitis  1. Mild acute alcoholic pancreatitis Nicolas [name]. Lipase 58 b. Follow triglycerides (elevated) and RUQ [place] (canceled - sounds like pt declined this - no mention of gallstones on CT) c. Pancreatitis on CT, abdominal pain seems to be improving today d. IV fluids pain control e. Will advance to heart healthy diet  2. Alcohol withdrawal  alcohol abuse Nicolas [name]. CIWA protocol - still scoring, but improved today - continue to monitor b. Last drink 11/27, continue to monitor c. seeking help for alcohol abuse - TOC consult  3. Anxiety/depression  Nicolas [name]. Made some passive suicidal statements yesterday, psych was reconsulted -> recommended increase zoloft to 50 mg, no need for inpatient psych - recommend working with social work to facilitate admission to inpatient substance abuse treatment facility    4. Hypertriglyceridemia 1. Continue fenofibrate  5. Mild alcoholic hepatitis Nicolas [name]. Trend CMP  DVT prophylaxis: lovenox Code Status: full  Family Communication: none at bedside Disposition:   Status is: Observation  The patient will require care  spanning > 2 midnights and should be moved to inpatient because: Inpatient level of care appropriate due to severity of illness  Dispo: The patient is from: Home              Anticipated d/c is to: Home              Anticipated d/c date is: > 3 days              Patient currently is not medically stable to d/c.       Consultants:   none  Procedures:  none  Antimicrobials:  Anti-infectives (From admission, onward)   None        Subjective: No new complaints Anxious about everything going on   Objective: Vitals:   05/11/20 0145 05/11/20 0300 05/11/20 0417 05/11/20 1351  BP:   130/79 (!) 138/94  Pulse: 86 90 72 68  Resp:   14 13  Temp:   98.4 F (36.9 C) 97.8 F (36.6 C)  TempSrc:   Oral Oral  SpO2:   98% 97%  Weight:      Height:        Intake/Output Summary (Last 24 hours) at 05/11/2020 1856 Last data filed at 05/11/2020 1528 Gross per 24 hour  Intake 4098.92 ml  Output --  Net 4098.92 ml   Filed Weights   05/09/20 1110  Weight: 86.2 kg    Examination:  General: No acute distress. Cardiovascular: Heart sounds show Nicolas [name] regular rate, and rhythm. Lungs: Clear to auscultation bilaterally Abdomen: Soft, nontender, nondistended Neurological: Alert and oriented 3. Moves all extremities 4 . Cranial nerves  II through XII grossly intact. Skin: Warm and dry. No rashes or lesions. Extremities: No clubbing or cyanosis. No edema.    Data Reviewed: I have personally reviewed following labs and imaging studies  CBC: Recent Labs  Lab 05/05/20 1814 05/09/20 1113 05/10/20 0408 05/11/20 0636  WBC 5.6 5.6 6.3 4.0  NEUTROABS  --  2.6  --  1.9  HGB 15.4 14.8 14.0 14.5  HCT 45.7 43.6 40.9 42.7  MCV 103.2* 103.1* 103.3* 103.9*  PLT 270 248 211 194    Basic Metabolic Panel: Recent Labs  Lab 05/05/20 1814 05/09/20 1113 05/09/20 2026 05/10/20 0408 05/11/20 0636  NA 140 141  --  139 137  K 3.6 3.4*  --  3.7 3.9  CL 103 102  --  100 101  CO2 25 26  --   29 25  GLUCOSE 111* 174*  --  88 112*  BUN <5* <5*  --  <5* 6  CREATININE 0.81 0.69  --  0.78 0.79  CALCIUM 9.8 9.1  --  8.7* 9.6  MG  --   --  1.6* 1.8 1.9  PHOS  --   --  3.6  --  3.9    GFR: Estimated Creatinine Clearance (by C-G formula based on SCr of 0.79 mg/dL) Male: 37.6 mL/min Male: 112.8 mL/min  Liver Function Tests: Recent Labs  Lab 05/05/20 1814 05/09/20 1113 05/10/20 0408 05/11/20 0636  AST 100* 81* 62* 62*  ALT 119* 82* 67* 59*  ALKPHOS 68 61 57 66  BILITOT 0.6 0.5 0.8 1.0  PROT 7.0 7.3 6.4* 6.7  ALBUMIN 4.6 4.5 3.9 4.0    CBG: No results for input(s): GLUCAP in the last 168 hours.   Recent Results (from the past 240 hour(s))  Resp Panel by RT-PCR (Flu Nicolas [name]&B, Covid) Nasopharyngeal Swab     Status: None   Collection Time: 05/04/20  8:04 AM   Specimen: Nasopharyngeal Swab; Nasopharyngeal(NP) swabs in vial transport medium  Result Value Ref Range Status   SARS Coronavirus 2 by RT PCR NEGATIVE NEGATIVE Final    Comment: (NOTE) SARS-CoV-2 target nucleic acids are NOT DETECTED.  The SARS-CoV-2 RNA is generally detectable in upper respiratory specimens during the acute phase of infection. The lowest concentration of SARS-CoV-2 viral copies this assay can detect is 138 copies/mL. Nicolas [name] negative result does not preclude SARS-Cov-2 infection and should not be used as the sole basis for treatment or other patient management decisions. Nicolas [name] negative result may occur with  improper specimen collection/handling, submission of specimen other than nasopharyngeal swab, presence of viral mutation(s) within the areas targeted by this assay, and inadequate number of viral copies(<138 copies/mL). Nicolas [name] negative result must be combined with clinical observations, patient history, and epidemiological information. The expected result is Negative.  Fact Sheet for Patients:  BloggerCourse.com  Fact Sheet for Healthcare Providers:    SeriousBroker.it  This test is no t yet approved or cleared by the [place] FDA and  has been authorized for detection and/or diagnosis of SARS-CoV-2 by FDA under an Emergency Use Authorization (EUA). This EUA will remain  in effect (meaning this test can be used) for the duration of the COVID-19 declaration under Section 564(b)(1) of the Act, 21 U.S.C.section 360bbb-3(b)(1), unless the authorization is terminated  or revoked sooner.       Influenza Nicolas [name] by PCR NEGATIVE NEGATIVE Final   Influenza B by PCR NEGATIVE NEGATIVE Final    Comment: (NOTE) The Xpert Xpress SARS-CoV-2/FLU/RSV plus assay is intended as  an aid in the diagnosis of influenza from Nasopharyngeal swab specimens and should not be used as Melenie Minniear sole basis for treatment. Nasal washings and aspirates are unacceptable for Xpert Xpress SARS-CoV-2/FLU/RSV testing.  Fact Sheet for Patients: BloggerCourse.com  Fact Sheet for Healthcare Providers: SeriousBroker.it  This test is not yet approved or cleared by the [place] FDA and has been authorized for detection and/or diagnosis of SARS-CoV-2 by FDA under an Emergency Use Authorization (EUA). This EUA will remain in effect (meaning this test can be used) for the duration of the COVID-19 declaration under Section 564(b)(1) of the Act, 21 U.S.C. section 360bbb-3(b)(1), unless the authorization is terminated or revoked.  Performed at [facility], [address], [place], [place] [address]   Resp Panel by RT-PCR (Flu Nicolas [name]&B, Covid) Nasopharyngeal Swab     Status: None   Collection Time: 05/09/20  5:39 PM   Specimen: Nasopharyngeal Swab; Nasopharyngeal(NP) swabs in vial transport medium  Result Value Ref Range Status   SARS Coronavirus 2 by RT PCR NEGATIVE NEGATIVE Final    Comment: (NOTE) SARS-CoV-2 target nucleic acids are NOT DETECTED.  The SARS-CoV-2 RNA is generally  detectable in upper respiratory specimens during the acute phase of infection. The lowest concentration of SARS-CoV-2 viral copies this assay can detect is 138 copies/mL. Nicolas [name] negative result does not preclude SARS-Cov-2 infection and should not be used as the sole basis for treatment or other patient management decisions. Nicolas [name] negative result may occur with  improper specimen collection/handling, submission of specimen other than nasopharyngeal swab, presence of viral mutation(s) within the areas targeted by this assay, and inadequate number of viral copies(<138 copies/mL). Nicolas [name] negative result must be combined with clinical observations, patient history, and epidemiological information. The expected result is Negative.  Fact Sheet for Patients:  BloggerCourse.com  Fact Sheet for Healthcare Providers:  SeriousBroker.it  This test is no t yet approved or cleared by the [place] FDA and  has been authorized for detection and/or diagnosis of SARS-CoV-2 by FDA under an Emergency Use Authorization (EUA). This EUA will remain  in effect (meaning this test can be used) for the duration of the COVID-19 declaration under Section 564(b)(1) of the Act, 21 U.S.C.section 360bbb-3(b)(1), unless the authorization is terminated  or revoked sooner.       Influenza Nicolas Wisser by PCR NEGATIVE NEGATIVE Final   Influenza B by PCR NEGATIVE NEGATIVE Final    Comment: (NOTE) The Xpert Xpress SARS-CoV-2/FLU/RSV plus assay is intended as an aid in the diagnosis of influenza from Nasopharyngeal swab specimens and should not be used as Dajion Bickford sole basis for treatment. Nasal washings and aspirates are unacceptable for Xpert Xpress SARS-CoV-2/FLU/RSV testing.  Fact Sheet for Patients: BloggerCourse.com  Fact Sheet for Healthcare Providers: SeriousBroker.it  This test is not yet approved or cleared by the [place] FDA  and has been authorized for detection and/or diagnosis of SARS-CoV-2 by FDA under an Emergency Use Authorization (EUA). This EUA will remain in effect (meaning this test can be used) for the duration of the COVID-19 declaration under Section 564(b)(1) of the Act, 21 U.S.C. section 360bbb-3(b)(1), unless the authorization is terminated or revoked.  Performed at [facility], [address], [place], [place] [address]          Radiology Studies: No results found.      Scheduled Meds: . diphenhydrAMINE  25 mg Intravenous Once  . fenofibrate  160 mg Oral Daily  . folic acid  1 mg Oral Daily  .  multivitamin with minerals  1 tablet Oral Daily  . nicotine  14 mg Transdermal Daily  . sertraline  25 mg Oral Daily  . sucralfate  1 g Oral TID WC & HS  . thiamine  100 mg Oral Daily   Or  . thiamine  100 mg Intravenous Daily   Continuous Infusions:    LOS: 1 day    Time spent: over 30 min    Lacretia Nicks, MD [facility]   To contact the attending provider between 7A-7P or the covering provider during after hours 7P-7A, please log into the web site [email or web address] and access using universal [place] password for that web site. If you do not have the password, please call the hospital operator.  05/11/2020, 6:56 PM

## 2020-05-11 NOTE — Consult Note (Signed)
[facility] Face-to-Face Psychiatry Consult   Reason for Consult: Depression Referring Physician: Internal medicine  Patient Identification: Nicolas [name] MRN:  [id number] Principal Diagnosis: Acute alcoholic pancreatitis Diagnosis:  Principal Problem:   Acute alcoholic pancreatitis Active Problems:   Alcohol use disorder, severe, dependence (HCC)   Alcoholic hepatitis   Pancreatitis   Total Time spent with patient: 30 minutes  Subjective:   Nicolas [name] is a 54 y.o. adult was seen and evaluated face-to-face.  Patient is observed to be resting, at this time however is pleasant upon awakening and willing to participate in evaluation.  Patient does admit to making suicidal statements, and states that this is a poor skill that he has. "  I say I am suicidal just as someone would say that having a bad day.  Just like people say they will kill someone, I say that.  If I was suicidal I would have kill myself already.  I does have a lot going on and my mother is trying her best to get me the help I need. 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  He is denies suicidal ideations.  At this time he is observed to be very restless, and goes from laying down to sitting up about every 2 minutes during this evaluation.  He appears to be very uncomfortable, emphasis was placed on alcohol detox and withdrawal symptoms he may be experiencing.  He verbalizes understanding.  Patient is very blunt, tangential, and abrasive at times however apologizes for his behavior.  At the time of this evaluation he denies any suicidal thoughts, homicidal thoughts, and or auditory visual hallucinations.    HPI: Per admission assessment note: patient presents to emergency department with chief complaint abdominal pain x2 days.  Patient states his pain is been intermittent.  Is located in his epigastric area and radiates to his back.  He describes the pain as sharp.  When  present he rates the pain 9 of 10 in severity.  He is endorsing associated nausea without emesis.  He has been drinking for 2 days straight he says.  His last drink was 20 minutes ago. He had 1 episode of non bloody diarrhea prior to arrival. He states he typically will have diarrhea when drinking.   Denies any drug use. He denies fever, chills, chest pain, urinary symptoms, blood in stool. Denies suicidal or homicidal ideations.   Past Psychiatric History:  Risk to Self:   Risk to Others:   Prior Inpatient Therapy:   Prior Outpatient Therapy:    Past Medical History:  Past Medical History:  Diagnosis Date  . Alcohol abuse   . Hypertension   . Pancreatitis     Past Surgical History:  Procedure Laterality Date  . NASAL SEPTUM SURGERY     Family History: History reviewed. No pertinent family history. Family Psychiatric  History:  Social History:  Social History   Substance and Sexual Activity  Alcohol Use Yes  . Alcohol/week: 6.0 standard drinks  . Types: 6 Cans of beer per week   Comment: daily use     Social History   Substance and Sexual Activity  Drug Use Not Currently  . Types: Cocaine   Comment: stopped 01/2019    Social History   Socioeconomic History  . Marital status: Single    Spouse name: Not on file  . Number of children: Not on file  . Years of education: Not on  file  . Highest education level: Not on file  Occupational History  . Not on file  Tobacco Use  . Smoking status: Current Every Day Smoker    Packs/day: 0.50    Types: Cigarettes  . Smokeless tobacco: Never Used  Vaping Use  . Vaping Use: Never used  Substance and Sexual Activity  . Alcohol use: Yes    Alcohol/week: 6.0 standard drinks    Types: 6 Cans of beer per week    Comment: daily use  . Drug use: Not Currently    Types: Cocaine    Comment: stopped 01/2019  . Sexual activity: Not on file  Other Topics Concern  . Not on file  Social History Narrative  . Not on file   Social  Determinants of Health   Financial Resource Strain:   . Difficulty of Paying Living Expenses: Not on file  Food Insecurity:   . Worried About Programme researcher, broadcasting/film/video in the Last Year: Not on file  . Ran Out of Food in the Last Year: Not on file  Transportation Needs: No Transportation Needs  . Lack of Transportation (Medical): No  . Lack of Transportation (Non-Medical): No  Physical Activity:   . Days of Exercise per Week: Not on file  . Minutes of Exercise per Session: Not on file  Stress:   . Feeling of Stress : Not on file  Social Connections:   . Frequency of Communication with Friends and Family: Not on file  . Frequency of Social Gatherings with Friends and Family: Not on file  . Attends Religious Services: Not on file  . Active Member of Clubs or Organizations: Not on file  . Attends Banker Meetings: Not on file  . Marital Status: Not on file   Additional Social History:    Allergies:   Allergies  Allergen Reactions  . Caffeine Palpitations and Anaphylaxis    Other reaction(s): Heart rhythm/rate changes Heart beat super fast and bean enzymes    . Bean Pod Extract Palpitations    Labs:  Results for orders placed or performed during the hospital encounter of 05/09/20 (from the past 48 hour(s))  Urinalysis, Routine w reflex microscopic Urine, Clean Catch     Status: Abnormal   Collection Time: 05/09/20 11:35 AM  Result Value Ref Range   Color, Urine STRAW (A) YELLOW   APPearance CLOUDY (A) CLEAR   Specific Gravity, Urine 1.004 (L) 1.005 - 1.030   pH 5.0 5.0 - 8.0   Glucose, UA NEGATIVE NEGATIVE mg/dL   Hgb urine dipstick NEGATIVE NEGATIVE   Bilirubin Urine NEGATIVE NEGATIVE   Ketones, ur NEGATIVE NEGATIVE mg/dL   Protein, ur NEGATIVE NEGATIVE mg/dL   Nitrite NEGATIVE NEGATIVE   Leukocytes,Ua NEGATIVE NEGATIVE    Comment: Performed at [facility], [address] [place], [place] [address]  Ethanol     Status: Abnormal    Collection Time: 05/09/20 11:35 AM  Result Value Ref Range   Alcohol, Ethyl (B) 170 (H) <10 mg/dL    Comment: (NOTE) Lowest detectable limit for serum alcohol is 10 mg/dL.  For medical purposes only. Performed at [facility], [address] [place], [place] [address]   Resp Panel by RT-PCR (Flu A&B, Covid) Nasopharyngeal Swab     Status: None   Collection Time: 05/09/20  5:39 PM   Specimen: Nasopharyngeal Swab; Nasopharyngeal(NP) swabs in vial transport medium  Result Value Ref Range   SARS Coronavirus 2 by RT PCR NEGATIVE  NEGATIVE    Comment: (NOTE) SARS-CoV-2 target nucleic acids are NOT DETECTED.  The SARS-CoV-2 RNA is generally detectable in upper respiratory specimens during the acute phase of infection. The lowest concentration of SARS-CoV-2 viral copies this assay can detect is 138 copies/mL. A negative result does not preclude SARS-Cov-2 infection and should not be used as the sole basis for treatment or other patient management decisions. A negative result may occur with  improper specimen collection/handling, submission of specimen other than nasopharyngeal swab, presence of viral mutation(s) within the areas targeted by this assay, and inadequate number of viral copies(<138 copies/mL). A negative result must be combined with clinical observations, patient history, and epidemiological information. The expected result is Negative.  Fact Sheet for Patients:  BloggerCourse.com  Fact Sheet for Healthcare Providers:  SeriousBroker.it  This test is no t yet approved or cleared by the [place] FDA and  has been authorized for detection and/or diagnosis of SARS-CoV-2 by FDA under an Emergency Use Authorization (EUA). This EUA will remain  in effect (meaning this test can be used) for the duration of the COVID-19 declaration under Section 564(b)(1) of the Act, 21 U.S.C.section 360bbb-3(b)(1), unless  the authorization is terminated  or revoked sooner.       Influenza A by PCR NEGATIVE NEGATIVE   Influenza B by PCR NEGATIVE NEGATIVE    Comment: (NOTE) The Xpert Xpress SARS-CoV-2/FLU/RSV plus assay is intended as an aid in the diagnosis of influenza from Nasopharyngeal swab specimens and should not be used as a sole basis for treatment. Nasal washings and aspirates are unacceptable for Xpert Xpress SARS-CoV-2/FLU/RSV testing.  Fact Sheet for Patients: BloggerCourse.com  Fact Sheet for Healthcare Providers: SeriousBroker.it  This test is not yet approved or cleared by the [place] FDA and has been authorized for detection and/or diagnosis of SARS-CoV-2 by FDA under an Emergency Use Authorization (EUA). This EUA will remain in effect (meaning this test can be used) for the duration of the COVID-19 declaration under Section 564(b)(1) of the Act, 21 U.S.C. section 360bbb-3(b)(1), unless the authorization is terminated or revoked.  Performed at [facility], [address] [place], [place] [address]   Magnesium     Status: Abnormal   Collection Time: 05/09/20  8:26 PM  Result Value Ref Range   Magnesium 1.6 (L) 1.7 - 2.4 mg/dL    Comment: Performed at [facility], [address], [place], [place] [address]  Phosphorus     Status: None   Collection Time: 05/09/20  8:26 PM  Result Value Ref Range   Phosphorus 3.6 2.5 - 4.6 mg/dL    Comment: Performed at [facility], [address], [place], [place] [address]  Hepatic function panel     Status: Abnormal   Collection Time: 05/10/20  4:08 AM  Result Value Ref Range   Total Protein 6.4 (L) 6.5 - 8.1 g/dL   Albumin 3.9 3.5 - 5.0 g/dL   AST 62 (H) 15 - 41 U/L   ALT 67 (H) 0 - 44 U/L   Alkaline Phosphatase 57 38 - 126 U/L   Total Bilirubin 0.8 0.3 - 1.2 mg/dL   Bilirubin, Direct 0.1 0.0 - 0.2 mg/dL   Indirect  Bilirubin 0.7 0.3 - 0.9 mg/dL    Comment: Performed at [facility], [address], [place], [place] [address]  Basic metabolic panel     Status: Abnormal   Collection Time: 05/10/20  4:08 AM  Result Value  Ref Range   Sodium 139 135 - 145 mmol/L   Potassium 3.7 3.5 - 5.1 mmol/L   Chloride 100 98 - 111 mmol/L   CO2 29 22 - 32 mmol/L   Glucose, Bld 88 70 - 99 mg/dL    Comment: Glucose reference range applies only to samples taken after fasting for at least 8 hours.   BUN <5 (L) 6 - 20 mg/dL   Creatinine, Ser 1.610.78 0.61 - 1.24 mg/dL   Calcium 8.7 (L) 8.9 - 10.3 mg/dL   GFR, Estimated >09>60 >60>60 mL/min    Comment: (NOTE) Calculated using the CKD-EPI Creatinine Equation (2021)    Anion gap 10 5 - 15    Comment: Performed at [facility], [address] [place], [place] [address]  CBC     Status: Abnormal   Collection Time: 05/10/20  4:08 AM  Result Value Ref Range   WBC 6.3 4.0 - 10.5 K/uL   RBC 3.96 (L) 4.22 - 5.81 MIL/uL   Hemoglobin 14.0 13.0 - 17.0 g/dL   HCT 81.140.9 39 - 52 %   MCV 103.3 (H) 80.0 - 100.0 fL   MCH 35.4 (H) 26.0 - 34.0 pg   MCHC 34.2 30.0 - 36.0 g/dL   RDW 91.412.3 78.211.5 - 95.615.5 %   Platelets 211 150 - 400 K/uL   nRBC 0.0 0.0 - 0.2 %    Comment: Performed at [facility], [address] [place], [place] [address]  Magnesium     Status: None   Collection Time: 05/10/20  4:08 AM  Result Value Ref Range   Magnesium 1.8 1.7 - 2.4 mg/dL    Comment: Performed at [facility], [address] [place], [place] [address]  Triglycerides     Status: Abnormal   Collection Time: 05/11/20  6:36 AM  Result Value Ref Range   Triglycerides 1,048 (H) <150 mg/dL    Comment: RESULTS CONFIRMED BY MANUAL DILUTION Performed at [facility], [address] [place], [place] [address]   CBC with Differential/Platelet     Status: Abnormal   Collection Time: 05/11/20  6:36 AM  Result Value Ref  Range   WBC 4.0 4.0 - 10.5 K/uL   RBC 4.11 (L) 4.22 - 5.81 MIL/uL   Hemoglobin 14.5 13.0 - 17.0 g/dL   HCT 52.842.7 39 - 52 %   MCV 103.9 (H) 80.0 - 100.0 fL   MCH 35.3 (H) 26.0 - 34.0 pg   MCHC 34.0 30.0 - 36.0 g/dL   RDW 41.312.1 24.411.5 - 01.015.5 %   Platelets 194 150 - 400 K/uL   nRBC 0.0 0.0 - 0.2 %   Neutrophils Relative % 46 %   Neutro Abs 1.9 1.7 - 7.7 K/uL   Lymphocytes Relative 33 %   Lymphs Abs 1.3 0.7 - 4.0 K/uL   Monocytes Relative 8 %   Monocytes Absolute 0.3 0.1 - 1.0 K/uL   Eosinophils Relative 10 %   Eosinophils Absolute 0.4 0.0 - 0.5 K/uL   Basophils Relative 2 %   Basophils Absolute 0.1 0.0 - 0.1 K/uL   Immature Granulocytes 1 %   Abs Immature Granulocytes 0.03 0.00 - 0.07 K/uL    Comment: Performed at [facility], [address] [place], [place] [address]  Comprehensive metabolic panel     Status: Abnormal   Collection Time: 05/11/20  6:36 AM  Result Value Ref Range   Sodium 137 135 - 145 mmol/L  Potassium 3.9 3.5 - 5.1 mmol/L   Chloride 101 98 - 111 mmol/L   CO2 25 22 - 32 mmol/L   Glucose, Bld 112 (H) 70 - 99 mg/dL    Comment: Glucose reference range applies only to samples taken after fasting for at least 8 hours.   BUN 6 6 - 20 mg/dL   Creatinine, Ser 6.76 0.61 - 1.24 mg/dL   Calcium 9.6 8.9 - 19.5 mg/dL   Total Protein 6.7 6.5 - 8.1 g/dL   Albumin 4.0 3.5 - 5.0 g/dL   AST 62 (H) 15 - 41 U/L   ALT 59 (H) 0 - 44 U/L   Alkaline Phosphatase 66 38 - 126 U/L   Total Bilirubin 1.0 0.3 - 1.2 mg/dL   GFR, Estimated >09 >32 mL/min    Comment: (NOTE) Calculated using the CKD-EPI Creatinine Equation (2021)    Anion gap 11 5 - 15    Comment: Performed at [facility], [address], [place], [place] [address]  Magnesium     Status: None   Collection Time: 05/11/20  6:36 AM  Result Value Ref Range   Magnesium 1.9 1.7 - 2.4 mg/dL    Comment: Performed at [facility], [address], [place], [place]  [address]  Phosphorus     Status: None   Collection Time: 05/11/20  6:36 AM  Result Value Ref Range   Phosphorus 3.9 2.5 - 4.6 mg/dL    Comment: Performed at [facility], [address] [place], [place] [address]    Current Facility-Administered Medications  Medication Dose Route Frequency Provider Last Rate Last Admin  . acetaminophen (TYLENOL) tablet 650 mg  650 mg Oral Q6H PRN Jae Dire, MD       Or  . acetaminophen (TYLENOL) suppository 650 mg  650 mg Rectal Q6H PRN Jae Dire, MD      . diphenhydrAMINE (BENADRYL) injection 25 mg  25 mg Intravenous Once Marikay Alar, FNP      . fenofibrate tablet 160 mg  160 mg Oral Daily Jae Dire, MD   160 mg at 05/11/20 1022  . folic acid (FOLVITE) tablet 1 mg  1 mg Oral Daily Jae Dire, MD   1 mg at 05/11/20 1022  . HYDROcodone-acetaminophen (NORCO/VICODIN) 5-325 MG per tablet 1 tablet  1 tablet Oral Q4H PRN Jae Dire, MD   1 tablet at 05/11/20 1022  . HYDROmorphone (DILAUDID) injection 0.5 mg  0.5 mg Intravenous Q3H PRN Zigmund Daniel., MD   0.5 mg at 05/11/20 0742  . iohexol (OMNIPAQUE) 300 MG/ML solution 80 mL  80 mL Intravenous Once PRN Jae Dire, MD      . lactated ringers infusion   Intravenous Continuous Zigmund Daniel., MD 125 mL/hr at 05/11/20 0747 New Bag at 05/11/20 0747  . LORazepam (ATIVAN) tablet 1-4 mg  1-4 mg Oral Q1H PRN Jae Dire, MD       Or  . LORazepam (ATIVAN) injection 1-4 mg  1-4 mg Intravenous Q1H PRN Jae Dire, MD   2 mg at 05/11/20 0742  . multivitamin with minerals tablet 1 tablet  1 tablet Oral Daily Jae Dire, MD   1 tablet at 05/11/20 1022  . nicotine (NICODERM CQ - dosed in mg/24 hours) patch 14 mg  14 mg Transdermal Daily Marikay Alar, FNP   14 mg at 05/11/20 1023  . ondansetron (ZOFRAN) tablet 4 mg  4 mg Oral Q6H PRN Jae Dire, MD   4 mg at 05/11/20 1022   Or  . ondansetron (ZOFRAN) injection 4 mg  4 mg Intravenous Q6H PRN Jae Dire, MD   4 mg at 05/11/20 0317  . sertraline (ZOLOFT) tablet 25 mg  25 mg Oral Daily Zigmund Daniel., MD   25 mg at 05/11/20 1022  . sucralfate (CARAFATE) tablet 1 g  1 g Oral TID WC & HS Zigmund Daniel., MD      . thiamine tablet 100 mg  100 mg Oral Daily Jae Dire, MD   100 mg at 05/11/20 1022   Or  . thiamine (B-1) injection 100 mg  100 mg Intravenous Daily Jae Dire, MD        Musculoskeletal: Strength & Muscle Tone: within normal limits Gait & Station: Observed resting in bed Patient leans: N/A  Psychiatric Specialty Exam: Physical Exam Vitals reviewed.  Psychiatric:        Mood and Affect: Mood normal.        Thought Content: Thought content normal.     Review of Systems  Psychiatric/Behavioral: Negative for suicidal ideas. The patient is nervous/anxious.   All other systems reviewed and are negative.   Blood pressure 130/79, pulse 72, temperature 98.4 F (36.9 C), temperature source Oral, resp. rate 14, height  (1.778 m), weight 86.2 kg, SpO2 98 %.Body mass index is 27.26 kg/m.  General Appearance: Well Groomed  Eye Contact:  Fair  Speech:  Slurred  Volume:  Normal  Mood:  Anxious and Irritable  Affect:  Congruent and Constricted  Thought Process:  Linear and Descriptions of Associations: Tangential  Orientation:  Full (Time, Place, and Person)  Thought Content:  Logical, Rumination and Tangential  Suicidal Thoughts:  Denies  Homicidal Thoughts:  Denies  Memory:  Immediate;   Fair Remote;   Fair  Judgement:  Fair  Insight:  Fair  Psychomotor Activity:  Restlessness and Tremor  Concentration:  Concentration: Fair and Attention Span: Fair  Recall:  [employer] of Knowledge:  Fair  Language:  Fair  Akathisia:  Negative  Handed:  Right  AIMS (if indicated):     Assets:  Communication Skills Desire for Improvement Resilience Social Support  ADL's:  Intact  Cognition:  WNL  Sleep:        Treatment Plan Summary: Plan  Continue current treatment plan at this time.  Will increase Zoloft to 50 mg p.o. daily for depression.  Did discuss treatment plan with patient to include 4 to 6 weeks before becoming effective to treat his depression.  -Continue alcohol detox and CIWA measures. -Patient denies suicidality at this time.  He no longer needs suicide precaution, however would encourage safety sitter as patient required reorientation and increased verbal redirection throughout this evaluation.  He is currently on day 2 of alcohol withdrawal, and should be closely monitored. -Continue Zoloft 25 mg p.o. daily for depression. -Recommend working closely with social work to facilitate admission to inpatient substance abuse treatment facility such as [facility]. -Patient would benefit from substance abuse intensive outpatient, once he has completed inpatient substance abuse treatment.  He also expressed interest about possible dual diagnosis facilities to receive treatment for his depression and substance abuse consider [facility] to [facility] or [facility].  Disposition: No evidence of imminent risk to self or others at present.   Patient does not meet criteria for psychiatric  inpatient admission. Supportive therapy provided about ongoing stressors. Discussed crisis plan, support from social network, calling 911, coming to the Emergency Department, and calling Suicide Hotline.   Maryagnes Amos, FNP 05/11/2020 11:28 AM

## 2020-05-11 NOTE — Progress Notes (Signed)
Call back received from Dr. Loney Loh, instructed to page M. Katherina Right (on-call floor coverage) to come see the patient at this time. Page sent to [facility].Katherina Right.

## 2020-05-11 NOTE — Progress Notes (Signed)
On-call provider M. Katherina Right in to see patient at this time, made aware patients last ciwa score was 21, patient received ativan per order parameters.

## 2020-05-11 NOTE — Progress Notes (Signed)
Patient with heightened anxiety and agitation, stating he is about to leave AMA, all we do is pop pills in him and not talk to him, this writer stayed in room approximately 25 minutes listening to patients concerns and fears, medicated with ativan per ciwa scale, patient now feeling much less anxious and states that he will stay one more day, will continue to monitor and offer support to patient.

## 2020-05-11 NOTE — Progress Notes (Signed)
Patient again saying he is going to leave the hospital, did however say that he would try to stay but is needing a nicotine patch for cravings, text page sent to on-call provider asking for order.

## 2020-05-12 ENCOUNTER — Other Ambulatory Visit: Payer: Self-pay | Admitting: *Deleted

## 2020-05-12 ENCOUNTER — Telehealth (HOSPITAL_COMMUNITY): Payer: Self-pay | Admitting: Family Medicine

## 2020-05-12 DIAGNOSIS — K852 Alcohol induced acute pancreatitis without necrosis or infection: Secondary | ICD-10-CM | POA: Diagnosis not present

## 2020-05-12 LAB — CBC WITH DIFFERENTIAL/PLATELET
Abs Immature Granulocytes: 0.03 10*3/uL (ref 0.00–0.07)
Basophils Absolute: 0.1 10*3/uL (ref 0.0–0.1)
Basophils Relative: 2 %
Eosinophils Absolute: 0.3 10*3/uL (ref 0.0–0.5)
Eosinophils Relative: 8 %
HCT: 45.9 % (ref 39.0–52.0)
Hemoglobin: 15.3 g/dL (ref 13.0–17.0)
Immature Granulocytes: 1 %
Lymphocytes Relative: 35 %
Lymphs Abs: 1.4 10*3/uL (ref 0.7–4.0)
MCH: 34.6 pg — ABNORMAL HIGH (ref 26.0–34.0)
MCHC: 33.3 g/dL (ref 30.0–36.0)
MCV: 103.8 fL — ABNORMAL HIGH (ref 80.0–100.0)
Monocytes Absolute: 0.4 10*3/uL (ref 0.1–1.0)
Monocytes Relative: 9 %
Neutro Abs: 1.7 10*3/uL (ref 1.7–7.7)
Neutrophils Relative %: 45 %
Platelets: 183 10*3/uL (ref 150–400)
RBC: 4.42 MIL/uL (ref 4.22–5.81)
RDW: 12.1 % (ref 11.5–15.5)
WBC: 3.8 10*3/uL — ABNORMAL LOW (ref 4.0–10.5)
nRBC: 0 % (ref 0.0–0.2)

## 2020-05-12 LAB — COMPREHENSIVE METABOLIC PANEL
ALT: 67 U/L — ABNORMAL HIGH (ref 0–44)
AST: 84 U/L — ABNORMAL HIGH (ref 15–41)
Albumin: 4.3 g/dL (ref 3.5–5.0)
Alkaline Phosphatase: 69 U/L (ref 38–126)
Anion gap: 9 (ref 5–15)
BUN: 9 mg/dL (ref 6–20)
CO2: 25 mmol/L (ref 22–32)
Calcium: 10 mg/dL (ref 8.9–10.3)
Chloride: 102 mmol/L (ref 98–111)
Creatinine, Ser: 0.95 mg/dL (ref 0.61–1.24)
GFR, Estimated: >60 mL/min (ref >60)
Glucose, Bld: 112 mg/dL — ABNORMAL HIGH (ref 70–99)
Potassium: 4.1 mmol/L (ref 3.5–5.1)
Sodium: 136 mmol/L (ref 135–145)
Total Bilirubin: 1 mg/dL (ref 0.3–1.2)
Total Protein: 7.2 g/dL (ref 6.5–8.1)

## 2020-05-12 LAB — TRIGLYCERIDES: Triglycerides: 530 mg/dL — ABNORMAL HIGH (ref <150)

## 2020-05-12 LAB — MAGNESIUM: Magnesium: 2 mg/dL (ref 1.7–2.4)

## 2020-05-12 LAB — PHOSPHORUS: Phosphorus: 4.2 mg/dL (ref 2.5–4.6)

## 2020-05-12 MED ORDER — SERTRALINE HCL 25 MG PO TABS: 50.0000 mg | ORAL_TABLET | Freq: Every day | ORAL | 0 refills | Status: AC

## 2020-05-12 MED ORDER — THIAMINE HCL 100 MG PO TABS: 100.0000 mg | ORAL_TABLET | Freq: Every day | ORAL | 0 refills | Status: AC

## 2020-05-12 MED ORDER — ADULT MULTIVITAMIN W/MINERALS CH: 1.0000 | ORAL_TABLET | Freq: Every day | ORAL | 0 refills | Status: AC

## 2020-05-12 MED ORDER — FOLIC ACID 1 MG PO TABS: 1.0000 mg | ORAL_TABLET | Freq: Every day | ORAL | 0 refills | Status: AC

## 2020-05-12 NOTE — Plan of Care (Signed)
Plan of care reviewed and discussed with the patient. 

## 2020-05-12 NOTE — Progress Notes (Signed)
Patient discharged to home. Given all belongings, instructions, prescriptions. Ambulated to pov. Verbalizes understanding of instructions. Denies any self harm ideation.

## 2020-05-12 NOTE — Telephone Encounter (Signed)
Care Management - Follow Up [facility] Discharges   Writer left a HIPPA compliant voice mail.  Per documentation in epic, patient is going to a treatment facility in [place].   Writer left name and phone number for the patent to call back if further assistance is needed in scheduling a follow up appointment with an outpatient provider.

## 2020-05-12 NOTE — Discharge Summary (Signed)
Physician Discharge Summary  Nicolas [name] IEP:[id number] DOB: [birth year] DOA: 05/09/2020  PCP: Clayborn Heron, MD  Admit date: 05/09/2020 Discharge date: 05/12/2020  Time spent: 40 minutes  Recommendations for Outpatient Follow-up:  1. Follow outpatient CBC/CMP 2. Encourage abstinence 3. Follow depression anxiety - follow zoloft dose, adjust as needed  4. Follow triglycerides outpatient   Discharge Diagnoses:  Principal Problem:   Acute alcoholic pancreatitis Active Problems:   Alcohol use disorder, severe, dependence (HCC)   Alcoholic hepatitis   Pancreatitis   Discharge Condition: stable  Diet recommendation: heart healthy  Filed Weights   05/09/20 1110  Weight: 86.2 kg    History of present illness:  This is Nicolas [name] 52 year oldwith Nicolas [name] past medical history of alcohol abuse with recurrent relapses, alcoholic pancreatitis, recent voluntary psych hospitalization for suicidal ideationand also ED visit for pancreatitis who presents for abdominal pain, vomiting x 2 days.Last drink was 20 minutes prior to arriving in the ED. They drink 10-12 40 ounce beers daily. Concerned about having recurrent relapses and is seeking help and to [facility]. Admitted for etoh withdrawal and pancreatitis.  He's improved after therapy for etoh withdrawal and pancreatitis.  He's going to pursue rehab for etoh use in [place].  Stable for d/c 11/30.  Hospital Course:  1. Mild acute alcoholic pancreatitis Nicolas [name]. Lipase 58 b. Follow triglycerides (elevated) and RUQ [place] (canceled - sounds like pt declined this - no mention of gallstones on CT) c. Pancreatitis on CT, abdominal pain seems to be improving today d. IV fluids pain control e. Will advance to heart healthy diet f. Resolved, follow outpatient - discussed importance of taking fibrate with triglycerides as well  2. Alcohol withdrawalalcohol abuse Nicolas [name]. CIWA protocol - still scoring, but improved today - continue to  monitor b. Last drink 11/27, continue to monitor c. seeking help for alcohol abuse -TOC consult - planning for treatment in [place] within 24 hrs of d/c  3. Anxiety/depression  Nicolas [name]. Made some passive suicidal statements yesterday, psych was reconsulted -> recommended increase zoloft to 50 mg, no need for inpatient psych - recommend working with social work to facilitate admission to inpatient substance abuse treatment facility    4. Hypertriglyceridemia 1. Continue fenofibrate  5. Mild alcoholic hepatitis Nicolas [name]. Trend CMP  Procedures:  none  Consultations:  psych  Discharge Exam: Vitals:   05/11/20 1351 05/11/20 2143  BP: (!) 138/94 127/86  Pulse: 68 75  Resp: 13 18  Temp: 97.8 F (36.6 C) 98.7 F (37.1 C)  SpO2: 97% 98%   Feeling well.  No cravings or withdrawal sx Has plan to go to [place] for treatment within 24 hrs of discharge.  General: No acute distress.  Standing and walking, sitting up in chair. Bilateral TM's without significant abnormality (asked me to look at ears due to discomfort) Lungs: unlabored Abd: pt pressed on his own abdomen after lifting his shirt while standing to show his abd pain had improved Neurological: Alert and oriented 3. Moves all extremities 4. Cranial nerves II through XII grossly intact.  No tremors noted.  Less sleepy than previous days. Skin: Warm and dry. No rashes or lesions.  Discharge Instructions   Discharge Instructions    Call MD for:  difficulty breathing, headache or visual disturbances   Complete by: As directed    Call MD for:  extreme fatigue   Complete by: As directed    Call MD for:  hives   Complete by: As directed    Call  MD for:  persistant dizziness or light-headedness   Complete by: As directed    Call MD for:  persistant nausea and vomiting   Complete by: As directed    Call MD for:  redness, tenderness, or signs of infection (pain, swelling, redness, odor or green/yellow discharge around incision  site)   Complete by: As directed    Call MD for:  severe uncontrolled pain   Complete by: As directed    Call MD for:  temperature >100.4   Complete by: As directed    Diet - low sodium heart healthy   Complete by: As directed    Discharge instructions   Complete by: As directed    You were seen for alcohol withdrawal and alcoholic pancreatitis.   You've improved with IV fluids and treatment.  Abstinence is extremely important.  Please follow up with rehab in [place] as you've planned.    We've started you on zoloft for depression and anxiety.  This takes 4-6 weeks to take effect.  Please follow up with Nicolas [name] psychiatrist or PCP outpatient for further management of this med.  Return for new, recurrent, or worsening symptoms.  Please ask your PCP to request records from this hospitalization so they know what was done and what the next steps will be.   Increase activity slowly   Complete by: As directed      Allergies as of 05/12/2020      Reactions   Caffeine Palpitations, Anaphylaxis   Other reaction(s): Heart rhythm/rate changes Heart beat super fast and bean enzymes    Bean Pod Extract Palpitations      Medication List    STOP taking these medications   acamprosate 333 MG tablet Commonly known as: CAMPRAL   chlordiazePOXIDE 25 MG capsule Commonly known as: LIBRIUM   pantoprazole 20 MG tablet Commonly known as: PROTONIX     TAKE these medications   atenolol 25 MG tablet Commonly known as: TENORMIN Take 25 mg by mouth daily.   fenofibrate 160 MG tablet Take 160 mg by mouth daily.   folic acid 1 MG tablet Commonly known as: FOLVITE Take 1 tablet (1 mg total) by mouth daily.   hydrOXYzine 25 MG capsule Commonly known as: VISTARIL Take 25 mg by mouth in the morning and at bedtime.   multivitamin with minerals Tabs tablet Take 1 tablet by mouth daily.   omeprazole 20 MG capsule Commonly known as: PRILOSEC Take 20 mg by mouth daily.   ondansetron 4 MG  disintegrating tablet Commonly known as: Zofran ODT Take 1 tablet (4 mg total) by mouth every 8 (eight) hours as needed for nausea or vomiting.   sertraline 25 MG tablet Commonly known as: ZOLOFT Take 2 tablets (50 mg total) by mouth daily.   sucralfate 1 g tablet Commonly known as: Carafate Take 1 tablet (1 g total) by mouth 4 (four) times daily -  with meals and at bedtime for 14 days. What changed: when to take this   thiamine 100 MG tablet Take 1 tablet (100 mg total) by mouth daily.      Allergies  Allergen Reactions  . Caffeine Palpitations and Anaphylaxis    Other reaction(s): Heart rhythm/rate changes Heart beat super fast and bean enzymes    . Bean Pod Extract Palpitations      The results of significant diagnostics from this hospitalization (including imaging, microbiology, ancillary and laboratory) are listed below for reference.    Significant Diagnostic Studies: CT ABDOMEN PELVIS W CONTRAST  Result Date: 05/09/2020 CLINICAL DATA:  Abdominal pain with elevated serum lipase EXAM: CT ABDOMEN AND PELVIS WITH CONTRAST TECHNIQUE: Multidetector CT imaging of the abdomen and pelvis was performed using the standard protocol following bolus administration of intravenous contrast. CONTRAST:  OMNIPAQUE IOHEXOL 300 MG/ML  SOLN COMPARISON:  Oct 20, 2019 FINDINGS: Lower chest: Lung bases are clear. Hepatobiliary: Liver measures 27.0 cm in length. There is diffuse hepatic steatosis. No focal liver lesions are evident. The gallbladder wall is not appreciably thickened. There is no biliary duct dilatation. Pancreas: There is relatively mild edema involving the uncinate process and Takasha Vetere portion of the head of the pancreas. There is less edema in this area compared to prior CT. Pancreas otherwise appears unremarkable. Currently there is no appreciable pancreatic mass or pseudocyst. There is no pancreatic duct dilatation or calcification. No evident pancreatic necrosis. There is no  peripancreatic fluid beyond minimal fluid immediately adjacent to the uncinate process of the pancreas. Spleen: No splenic lesions are evident. Byrne Capek small splenule is noted medial to the spleen inferiorly. Adrenals/Urinary Tract: Adrenals appear normal bilaterally. There is no evident renal mass or hydronephrosis on either side. There is no evident renal or ureteral calculus on either side. Urinary bladder is midline with wall thickness within normal limits. Stomach/Bowel: There is no appreciable bowel wall or mesenteric thickening. Terminal ileum appears normal. There is no appreciable bowel obstruction. There is no evident free air or portal venous air. Vascular/Lymphatic: Aorta is mildly tortuous. No abdominal aortic aneurysm. No appreciable arterial vascular lesions evident. Major venous structures appear patent. There is no evident adenopathy in the abdomen or pelvis. Reproductive: Prostate and seminal vesicles are normal in size and contour. Other: There is Aubrielle Stroud left inguinal hernia containing fat but no bowel. Analeise Mccleery loop of colon extends to the outer edge of this hernia but does not extend into this hernia. The appendix appears normal except for small appendicoliths in the distal appendix. No appendiceal thickening or dilatation evident. No periappendiceal inflammation. No ascites or abscess evident in the abdomen or pelvis. Musculoskeletal: No blastic or lytic bone lesions. No intramuscular lesions are evident. IMPRESSION: 1. Evidence of acute pancreatitis involving the uncinate process and Tayveon Lombardo portion of the pancreatic head. Minimal fluid immediately adjacent to the uncinate process. No more distant fluid. No pancreatic mass or generalized edema. No calcification or necrosis involving the pancreas. No pancreatic duct dilatation. 2.  Hepatomegaly with hepatic steatosis. 3. No evident bowel obstruction. No abscess in the abdomen pelvis. No appendiceal inflammation. 4.  Small left inguinal hernia containing only fat.  Electronically Signed   By: Bretta Bang III M.D.   On: 05/09/2020 14:21    Microbiology: Recent Results (from the past 240 hour(s))  Resp Panel by RT-PCR (Flu Ridgely Anastacio&B, Covid) Nasopharyngeal Swab     Status: None   Collection Time: 05/04/20  8:04 AM   Specimen: Nasopharyngeal Swab; Nasopharyngeal(NP) swabs in vial transport medium  Result Value Ref Range Status   SARS Coronavirus 2 by RT PCR NEGATIVE NEGATIVE Final    Comment: (NOTE) SARS-CoV-2 target nucleic acids are NOT DETECTED.  The SARS-CoV-2 RNA is generally detectable in upper respiratory specimens during the acute phase of infection. The lowest concentration of SARS-CoV-2 viral copies this assay can detect is 138 copies/mL. Ashutosh Dieguez negative result does not preclude SARS-Cov-2 infection and should not be used as the sole basis for treatment or other patient management decisions. Kalil Woessner negative result may occur with  improper specimen collection/handling, submission of specimen other than  nasopharyngeal swab, presence of viral mutation(s) within the areas targeted by this assay, and inadequate number of viral copies(<138 copies/mL). Essica Kiker negative result must be combined with clinical observations, patient history, and epidemiological information. The expected result is Negative.  Fact Sheet for Patients:  BloggerCourse.com  Fact Sheet for Healthcare Providers:  SeriousBroker.it  This test is no t yet approved or cleared by the [place] FDA and  has been authorized for detection and/or diagnosis of SARS-CoV-2 by FDA under an Emergency Use Authorization (EUA). This EUA will remain  in effect (meaning this test can be used) for the duration of the COVID-19 declaration under Section 564(b)(1) of the Act, 21 U.S.C.section 360bbb-3(b)(1), unless the authorization is terminated  or revoked sooner.       Influenza Wolfe Camarena by PCR NEGATIVE NEGATIVE Final   Influenza B by PCR NEGATIVE  NEGATIVE Final    Comment: (NOTE) The Xpert Xpress SARS-CoV-2/FLU/RSV plus assay is intended as an aid in the diagnosis of influenza from Nasopharyngeal swab specimens and should not be used as Madasyn Heath sole basis for treatment. Nasal washings and aspirates are unacceptable for Xpert Xpress SARS-CoV-2/FLU/RSV testing.  Fact Sheet for Patients: BloggerCourse.com  Fact Sheet for Healthcare Providers: SeriousBroker.it  This test is not yet approved or cleared by the [place] FDA and has been authorized for detection and/or diagnosis of SARS-CoV-2 by FDA under an Emergency Use Authorization (EUA). This EUA will remain in effect (meaning this test can be used) for the duration of the COVID-19 declaration under Section 564(b)(1) of the Act, 21 U.S.C. section 360bbb-3(b)(1), unless the authorization is terminated or revoked.  Performed at [facility], [address] [place], [place] [address]   Resp Panel by RT-PCR (Flu Kiona Blume&B, Covid) Nasopharyngeal Swab     Status: None   Collection Time: 05/09/20  5:39 PM   Specimen: Nasopharyngeal Swab; Nasopharyngeal(NP) swabs in vial transport medium  Result Value Ref Range Status   SARS Coronavirus 2 by RT PCR NEGATIVE NEGATIVE Final    Comment: (NOTE) SARS-CoV-2 target nucleic acids are NOT DETECTED.  The SARS-CoV-2 RNA is generally detectable in upper respiratory specimens during the acute phase of infection. The lowest concentration of SARS-CoV-2 viral copies this assay can detect is 138 copies/mL. Keylani Perlstein negative result does not preclude SARS-Cov-2 infection and should not be used as the sole basis for treatment or other patient management decisions. Valinda Fedie negative result may occur with  improper specimen collection/handling, submission of specimen other than nasopharyngeal swab, presence of viral mutation(s) within the areas targeted by this assay, and inadequate number of  viral copies(<138 copies/mL). Ambrosio Reuter negative result must be combined with clinical observations, patient history, and epidemiological information. The expected result is Negative.  Fact Sheet for Patients:  BloggerCourse.com  Fact Sheet for Healthcare Providers:  SeriousBroker.it  This test is no t yet approved or cleared by the [place] FDA and  has been authorized for detection and/or diagnosis of SARS-CoV-2 by FDA under an Emergency Use Authorization (EUA). This EUA will remain  in effect (meaning this test can be used) for the duration of the COVID-19 declaration under Section 564(b)(1) of the Act, 21 U.S.C.section 360bbb-3(b)(1), unless the authorization is terminated  or revoked sooner.       Influenza Khyra Viscuso by PCR NEGATIVE NEGATIVE Final   Influenza B by PCR NEGATIVE NEGATIVE Final    Comment: (NOTE) The Xpert Xpress SARS-CoV-2/FLU/RSV plus assay is intended as an aid in the diagnosis of influenza from Nasopharyngeal swab specimens  and should not be used as Georga Stys sole basis for treatment. Nasal washings and aspirates are unacceptable for Xpert Xpress SARS-CoV-2/FLU/RSV testing.  Fact Sheet for Patients: BloggerCourse.com  Fact Sheet for Healthcare Providers: SeriousBroker.it  This test is not yet approved or cleared by the [place] FDA and has been authorized for detection and/or diagnosis of SARS-CoV-2 by FDA under an Emergency Use Authorization (EUA). This EUA will remain in effect (meaning this test can be used) for the duration of the COVID-19 declaration under Section 564(b)(1) of the Act, 21 U.S.C. section 360bbb-3(b)(1), unless the authorization is terminated or revoked.  Performed at [facility], [address], [place], [place] [address]      Labs: Basic Metabolic Panel: Recent Labs  Lab 05/05/20 1814 05/09/20 1113 05/09/20 2026  05/10/20 0408 05/11/20 0636 05/12/20 0344  NA 140 141  --  139 137 136  K 3.6 3.4*  --  3.7 3.9 4.1  CL 103 102  --  100 101 102  CO2 25 26  --  29 25 25   GLUCOSE 111* 174*  --  88 112* 112*  BUN <5* <5*  --  <5* 6 9  CREATININE 0.81 0.69  --  0.78 0.79 0.95  CALCIUM 9.8 9.1  --  8.7* 9.6 10.0  MG  --   --  1.6* 1.8 1.9 2.0  PHOS  --   --  3.6  --  3.9 4.2   Liver Function Tests: Recent Labs  Lab 05/05/20 1814 05/09/20 1113 05/10/20 0408 05/11/20 0636 05/12/20 0344  AST 100* 81* 62* 62* 84*  ALT 119* 82* 67* 59* 67*  ALKPHOS 68 61 57 66 69  BILITOT 0.6 0.5 0.8 1.0 1.0  PROT 7.0 7.3 6.4* 6.7 7.2  ALBUMIN 4.6 4.5 3.9 4.0 4.3   Recent Labs  Lab 05/05/20 1814 05/09/20 1113  LIPASE 27 58*   No results for input(s): AMMONIA in the last 168 hours. CBC: Recent Labs  Lab 05/05/20 1814 05/09/20 1113 05/10/20 0408 05/11/20 0636 05/12/20 0344  WBC 5.6 5.6 6.3 4.0 3.8*  NEUTROABS  --  2.6  --  1.9 1.7  HGB 15.4 14.8 14.0 14.5 15.3  HCT 45.7 43.6 40.9 42.7 45.9  MCV 103.2* 103.1* 103.3* 103.9* 103.8*  PLT 270 248 211 194 183   Cardiac Enzymes: No results for input(s): CKTOTAL, CKMB, CKMBINDEX, TROPONINI in the last 168 hours. BNP: BNP (last 3 results) No results for input(s): BNP in the last 8760 hours.  ProBNP (last 3 results) No results for input(s): PROBNP in the last 8760 hours.  CBG: No results for input(s): GLUCAP in the last 168 hours.     Signed:  Lacretia Nicks MD.  [facility] 05/12/2020, 9:45 AM

## 2020-05-12 NOTE — Patient Outreach (Signed)
[facility]) Care Management  05/12/2020  Nicolas [name] 1965-10-25 [id number]   CSW made contact with pt who reports just being released from hospital and heading to [place], [place] for a "30 day program".  Pt is very interested in having counseling arranged at time of release. CSW will outreach [facility] rep for assistance with in-network options.  CSW offered pt encouragement and support. Will plan outreach in 3-4 weeks.   [name], MSW, LCSW Clinical Social Worker  [facility] [phone]

## 2020-06-03 ENCOUNTER — Other Ambulatory Visit: Payer: Self-pay | Admitting: *Deleted

## 2020-06-04 NOTE — Patient Outreach (Signed)
[facility]) Care Management  06/04/2020  Nicolas [name] 1966/03/30 [id number]   CSW spoke with pt who reports he is s/p inpatient detox/rehab and is doing well.  "I am staying busy and focused on 2 words that meant a lot to me while in rehab".  Pt shared that "mindfulness" is a word and action he is focusing on to get him through the days ahead.  He also is focusing on "usefulness". "I need to find ways to feel useful and helping others".  CSW validated his focus and desires to stay sober.  CSW offered support and encouragement; pt wants to focus on his AA meetings and seek counseling support after the new year.   CSW plans to touch base with pt the first week of January. Pt appreciative of support.   [name], MSW, LCSW Clinical Social Worker  [facility] [phone]        [name], MSW, LCSW Clinical Social Worker  [facility] [employer] [phone]

## 2020-06-10 ENCOUNTER — Other Ambulatory Visit: Payer: Self-pay

## 2020-06-10 ENCOUNTER — Emergency Department (HOSPITAL_COMMUNITY)
Admission: EM | Admit: 2020-06-10 | Discharge: 2020-06-11 | Disposition: A | Discharge status: Home / Self Care | Payer: 59 | Attending: Emergency Medicine | Admitting: Emergency Medicine

## 2020-06-10 ENCOUNTER — Encounter (HOSPITAL_COMMUNITY): Payer: Self-pay

## 2020-06-10 DIAGNOSIS — X58XXXA Exposure to other specified factors, initial encounter: Secondary | ICD-10-CM | POA: Insufficient documentation

## 2020-06-10 DIAGNOSIS — S300XXA Contusion of lower back and pelvis, initial encounter: Secondary | ICD-10-CM

## 2020-06-10 DIAGNOSIS — I10 Essential (primary) hypertension: Secondary | ICD-10-CM | POA: Insufficient documentation

## 2020-06-10 DIAGNOSIS — Z79899 Other long term (current) drug therapy: Secondary | ICD-10-CM | POA: Insufficient documentation

## 2020-06-10 DIAGNOSIS — F17210 Nicotine dependence, cigarettes, uncomplicated: Secondary | ICD-10-CM | POA: Diagnosis not present

## 2020-06-10 DIAGNOSIS — F1010 Alcohol abuse, uncomplicated: Secondary | ICD-10-CM

## 2020-06-10 DIAGNOSIS — S79911A Unspecified injury of right hip, initial encounter: Secondary | ICD-10-CM | POA: Diagnosis present

## 2020-06-10 DIAGNOSIS — R1084 Generalized abdominal pain: Secondary | ICD-10-CM | POA: Diagnosis not present

## 2020-06-10 DIAGNOSIS — F10129 Alcohol abuse with intoxication, unspecified: Secondary | ICD-10-CM | POA: Diagnosis not present

## 2020-06-10 MED ORDER — SODIUM CHLORIDE 0.9 % IV BOLUS
1000.0000 mL | Freq: Once | INTRAVENOUS | Status: AC
  Administered 2020-06-11: 1000 mL via INTRAVENOUS

## 2020-06-10 MED ORDER — LORAZEPAM 1 MG PO TABS: 0.0000 mg | ORAL_TABLET | Freq: Two times a day (BID) | ORAL | Status: DC

## 2020-06-10 MED ORDER — THIAMINE HCL 100 MG PO TABS: 100.0000 mg | ORAL_TABLET | Freq: Every day | ORAL | Status: DC

## 2020-06-10 MED ORDER — LORAZEPAM 2 MG/ML IJ SOLN
0.0000 mg | Freq: Four times a day (QID) | INTRAMUSCULAR | Status: DC
  Administered 2020-06-11: 2 mg via INTRAVENOUS
  Filled 2020-06-10: qty 1

## 2020-06-10 MED ORDER — LORAZEPAM 2 MG/ML IJ SOLN: 0.0000 mg | Freq: Two times a day (BID) | INTRAMUSCULAR | Status: DC

## 2020-06-10 MED ORDER — THIAMINE HCL 100 MG/ML IJ SOLN: 100.0000 mg | Freq: Every day | INTRAMUSCULAR | Status: DC

## 2020-06-10 MED ORDER — LORAZEPAM 1 MG PO TABS: 0.0000 mg | ORAL_TABLET | Freq: Four times a day (QID) | ORAL | Status: DC

## 2020-06-10 NOTE — ED Triage Notes (Addendum)
Pt reports going to an alcohol treatment center and receiving an opioid blocking injection "vivitrol" and now  Has a rash at injection sight that hurt so he relapsed and has had 20 beverages for the last 5 days.

## 2020-06-11 LAB — CBC WITH DIFFERENTIAL/PLATELET
Abs Immature Granulocytes: 0.13 10*3/uL — ABNORMAL HIGH (ref 0.00–0.07)
Basophils Absolute: 0.2 10*3/uL — ABNORMAL HIGH (ref 0.0–0.1)
Basophils Relative: 2 %
Eosinophils Absolute: 0.8 10*3/uL — ABNORMAL HIGH (ref 0.0–0.5)
Eosinophils Relative: 10 %
HCT: 38.6 % — ABNORMAL LOW (ref 39.0–52.0)
Hemoglobin: 13.2 g/dL (ref 13.0–17.0)
Immature Granulocytes: 2 %
Lymphocytes Relative: 31 %
Lymphs Abs: 2.3 10*3/uL (ref 0.7–4.0)
MCH: 34.7 pg — ABNORMAL HIGH (ref 26.0–34.0)
MCHC: 34.2 g/dL (ref 30.0–36.0)
MCV: 101.6 fL — ABNORMAL HIGH (ref 80.0–100.0)
Monocytes Absolute: 0.6 10*3/uL (ref 0.1–1.0)
Monocytes Relative: 8 %
Neutro Abs: 3.5 10*3/uL (ref 1.7–7.7)
Neutrophils Relative %: 47 %
Platelets: 226 10*3/uL (ref 150–400)
RBC: 3.8 MIL/uL — ABNORMAL LOW (ref 4.22–5.81)
RDW: 12 % (ref 11.5–15.5)
WBC: 7.4 10*3/uL (ref 4.0–10.5)
nRBC: 0 % (ref 0.0–0.2)

## 2020-06-11 LAB — COMPREHENSIVE METABOLIC PANEL
ALT: 101 U/L — ABNORMAL HIGH (ref 0–44)
AST: 97 U/L — ABNORMAL HIGH (ref 15–41)
Albumin: 4 g/dL (ref 3.5–5.0)
Alkaline Phosphatase: 65 U/L (ref 38–126)
Anion gap: 19 — ABNORMAL HIGH (ref 5–15)
BUN: <5 mg/dL — ABNORMAL LOW (ref 6–20)
CO2: 20 mmol/L — ABNORMAL LOW (ref 22–32)
Calcium: 9 mg/dL (ref 8.9–10.3)
Chloride: 101 mmol/L (ref 98–111)
Creatinine, Ser: 0.79 mg/dL (ref 0.61–1.24)
GFR, Estimated: >60 mL/min (ref >60)
Glucose, Bld: 140 mg/dL — ABNORMAL HIGH (ref 70–99)
Potassium: 3.7 mmol/L (ref 3.5–5.1)
Sodium: 140 mmol/L (ref 135–145)
Total Bilirubin: 0.5 mg/dL (ref 0.3–1.2)
Total Protein: 6.6 g/dL (ref 6.5–8.1)

## 2020-06-11 LAB — LIPASE, BLOOD: Lipase: 18 U/L (ref 11–51)

## 2020-06-11 MED ORDER — ACETAMINOPHEN 500 MG PO TABS
1000.0000 mg | ORAL_TABLET | Freq: Four times a day (QID) | ORAL | Status: DC | PRN
  Administered 2020-06-11: 1000 mg via ORAL
  Filled 2020-06-11: qty 2

## 2020-06-11 NOTE — ED Provider Notes (Signed)
[facility]-EMERGENCY DEPT Provider Note   CSN: [id number] Arrival date & time: 06/10/20  2133     History Chief Complaint  Patient presents with  . Alcohol Intoxication    [name] is a 54 y.o. adult.  Patient to ED for evaluation of abdominal pain after relapsing on alcohol for the past 5 days. He reports recent 30-day treatment for alcohol dependence where he received IM medication to right buttock resulting in a large painful hematoma. He states he returned to alcohol to stop the pain. No vomiting, fever. History of pancreatitis and "liver problems". He denies drug use.   The history is provided by the patient. No language interpreter was used.       Past Medical History:  Diagnosis Date  . Alcohol abuse   . Hypertension   . Pancreatitis     Patient Active Problem List   Diagnosis Date Noted  . Pancreatitis 05/10/2020  . Acute alcoholic pancreatitis 05/09/2020  . GERD (gastroesophageal reflux disease) 11/03/2019  . Alcoholic hepatitis 11/02/2019  . Major depressive disorder, recurrent episode, severe (HCC) 10/21/2019  . 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 Acute pancreatitis 10/20/2019  . Alcohol use disorder, severe, dependence (HCC) 10/16/2019    Past Surgical History:  Procedure Laterality Date  . NASAL SEPTUM SURGERY         No family history on file.  Social History   Tobacco Use  . Smoking status: Current Every Day Smoker    Packs/day: 0.50    Types: Cigarettes  . Smokeless tobacco: Never Used  Vaping Use  . Vaping Use: Never used  Substance Use Topics  . Alcohol use: Yes    Alcohol/week: 6.0 standard drinks    Types: 6 Cans of beer per week    Comment: daily use  . Drug use: Not Currently    Types: Cocaine    Comment: stopped 01/2019    Home Medications Prior to Admission medications   Medication Sig Start Date End Date Taking? Authorizing Provider  atenolol (TENORMIN) 25 MG tablet Take 25 mg by mouth daily.  08/31/19   [provider]  fenofibrate 160 MG tablet Take 160 mg by mouth daily.  11/22/19   [provider]  folic acid (FOLVITE) 1 MG tablet Take 1 tablet (1 mg total) by mouth daily. 05/12/20 06/11/20  Zigmund DanielPowell, A Caldwell Jr., MD  hydrOXYzine (VISTARIL) 25 MG capsule Take 25 mg by mouth in the morning and at bedtime.    [provider]  Multiple Vitamin (MULTIVITAMIN WITH MINERALS) TABS tablet Take 1 tablet by mouth daily. 05/12/20 06/11/20  Zigmund DanielPowell, A Caldwell Jr., MD  omeprazole (PRILOSEC) 20 MG capsule Take 20 mg by mouth daily. 04/01/20   [provider]  ondansetron (ZOFRAN ODT) 4 MG disintegrating tablet Take 1 tablet (4 mg total) by mouth every 8 (eight) hours as needed for nausea or vomiting. 01/26/20   Little, Ambrose Finlandachel Morgan, MD  sertraline (ZOLOFT) 25 MG tablet Take 2 tablets (50 mg total) by mouth daily. 05/12/20 06/11/20  Zigmund DanielPowell, A Caldwell Jr., MD  sucralfate (CARAFATE) 1 g tablet Take 1 tablet (1 g total) by mouth 4 (four) times daily -  with meals and at bedtime for 14 days. Patient taking differently: Take 1 g by mouth daily.  10/31/19 05/09/20  [name], Ines BloomerShawn C, PA-C  thiamine 100 MG tablet Take 1 tablet (100 mg total) by mouth daily. 05/12/20 06/11/20  Zigmund DanielPowell, A Caldwell Jr., MD    Allergies    Caffeine  and Bean pod extract  Review of Systems   Review of Systems  Constitutional: Negative for chills and fever.  HENT: Negative.   Respiratory: Negative.   Cardiovascular: Negative.   Gastrointestinal: Positive for abdominal pain. Negative for vomiting.  Musculoskeletal:       Pain with hematoma right buttock  Skin: Negative.   Neurological: Negative.     Physical Exam Updated Vital Signs BP (!) 152/77 (BP Location: Right Arm)   Pulse 78   Temp 98.1 F (36.7 C) (Oral)   Resp 13   SpO2 92%   Physical Exam Vitals and nursing note reviewed.  Constitutional:      Appearance: Nicolas [name] is well-developed and well-nourished.  HENT:      Head: Normocephalic.  Cardiovascular:     Rate and Rhythm: Normal rate and regular rhythm.     Heart sounds: No murmur heard.   Pulmonary:     Effort: Pulmonary effort is normal.     Breath sounds: Normal breath sounds. No wheezing, rhonchi or rales.  Abdominal:     General: Bowel sounds are normal.     Palpations: Abdomen is soft.     Tenderness: There is abdominal tenderness (diffuse). There is no guarding or rebound.  Musculoskeletal:        General: Normal range of motion.     Cervical back: Normal range of motion and neck supple.     Comments: Large hematoma right upper buttock with resolving ecchymosis.   Skin:    General: Skin is warm and dry.     Findings: No rash.  Neurological:     Mental Status: Nicolas [name] is alert and oriented to person, place, and time.  Psychiatric:        Mood and Affect: Mood and affect normal.     ED Results / Procedures / Treatments   Labs (all labs ordered are listed, but only abnormal results are displayed) Labs Reviewed  COMPREHENSIVE METABOLIC PANEL - Abnormal; Notable for the following components:      Result Value   CO2 20 (*)    Glucose, Bld 140 (*)    BUN <5 (*)    AST 97 (*)    ALT 101 (*)    Anion gap 19 (*)    All other components within normal limits  CBC WITH DIFFERENTIAL/PLATELET - Abnormal; Notable for the following components:   RBC 3.80 (*)    HCT 38.6 (*)    MCV 101.6 (*)    MCH 34.7 (*)    Eosinophils Absolute 0.8 (*)    Basophils Absolute 0.2 (*)    Abs Immature Granulocytes 0.13 (*)    All other components within normal limits  LIPASE, BLOOD  CBC WITH DIFFERENTIAL/PLATELET   Results for orders placed or performed during the hospital encounter of 06/10/20  Comprehensive metabolic panel  Result Value Ref Range   Sodium 140 135 - 145 mmol/L   Potassium 3.7 3.5 - 5.1 mmol/L   Chloride 101 98 - 111 mmol/L   CO2 20 (L) 22 - 32 mmol/L   Glucose, Bld 140 (H) 70 - 99 mg/dL   BUN <5 (L) 6 - 20 mg/dL    Creatinine, Ser 8.67 0.61 - 1.24 mg/dL   Calcium 9.0 8.9 - 67.2 mg/dL   Total Protein 6.6 6.5 - 8.1 g/dL   Albumin 4.0 3.5 - 5.0 g/dL   AST 97 (H) 15 - 41 U/L   ALT 101 (H) 0 - 44 U/L   Alkaline  Phosphatase 65 38 - 126 U/L   Total Bilirubin 0.5 0.3 - 1.2 mg/dL   GFR, Estimated >10 >93 mL/min   Anion gap 19 (H) 5 - 15  Lipase, blood  Result Value Ref Range   Lipase 18 11 - 51 U/L  CBC with Differential/Platelet  Result Value Ref Range   WBC 7.4 4.0 - 10.5 K/uL   RBC 3.80 (L) 4.22 - 5.81 MIL/uL   Hemoglobin 13.2 13.0 - 17.0 g/dL   HCT 23.5 (L) 57.3 - 22.0 %   MCV 101.6 (H) 80.0 - 100.0 fL   MCH 34.7 (H) 26.0 - 34.0 pg   MCHC 34.2 30.0 - 36.0 g/dL   RDW 25.4 27.0 - 62.3 %   Platelets 226 150 - 400 K/uL   nRBC 0.0 0.0 - 0.2 %   Neutrophils Relative % 47 %   Neutro Abs 3.5 1.7 - 7.7 K/uL   Lymphocytes Relative 31 %   Lymphs Abs 2.3 0.7 - 4.0 K/uL   Monocytes Relative 8 %   Monocytes Absolute 0.6 0.1 - 1.0 K/uL   Eosinophils Relative 10 %   Eosinophils Absolute 0.8 (H) 0.0 - 0.5 K/uL   Basophils Relative 2 %   Basophils Absolute 0.2 (H) 0.0 - 0.1 K/uL   Immature Granulocytes 2 %   Abs Immature Granulocytes 0.13 (H) 0.00 - 0.07 K/uL     EKG None  Radiology No results found.  Procedures Procedures (including critical care time)  Medications Ordered in ED Medications  LORazepam (ATIVAN) injection 0-4 mg (2 mg Intravenous Given 06/11/20 0026)    Or  LORazepam (ATIVAN) tablet 0-4 mg ( Oral See Alternative 06/11/20 0026)  LORazepam (ATIVAN) injection 0-4 mg (has no administration in time range)    Or  LORazepam (ATIVAN) tablet 0-4 mg (has no administration in time range)  thiamine tablet 100 mg (has no administration in time range)    Or  thiamine (B-1) injection 100 mg (has no administration in time range)  acetaminophen (TYLENOL) tablet 1,000 mg (1,000 mg Oral Given 06/11/20 0049)  sodium chloride 0.9 % bolus 1,000 mL (0 mLs Intravenous Stopped 06/11/20 0236)     ED Course  I have reviewed the triage vital signs and the nursing notes.  Pertinent labs & imaging results that were available during my care of the patient were reviewed by me and considered in my medical decision making (see chart for details).    MDM Rules/Calculators/A&P                          Patient to ED with abdominal pain after relapsing on alcohol x 5 days. No vomiting or fever.   He complains more about pain in the right buttock. Abdominal exam is essentially benign. No vomiting. He is found sleeping on his abdomen on multiple re-evaluations without restlessness.   Labs reassuring. No evidence acute pancreatitis. No leukocytosis.   Patient updated on stability for discharge and agrees with plan of care. He reports he has resources for treatment centers and does not need any more. Recommended for him to continue treatment of the hematoma as directed by his primary care physician.   Final Clinical Impression(s) / ED Diagnoses Final diagnoses:  None   1. Abdominal pain 2. Alcohol dependence 3. Hematoma right buttock..  Rx / DC Orders ED Discharge Orders    None       Danne Harbor 06/11/20 7628    Cardama, Amadeo Garnet,  MD 06/14/20 1427

## 2020-06-11 NOTE — Discharge Instructions (Addendum)
Call your sponsor in the morning and reconnect with your AA program. Seek additional help through treatment centers - referrals provided.   Recommend daily use of Prilosec twice daily. This medication can be found over-the-counter.   Warm compresses to the hematoma (large sore area on the left buttock) for additional comfort. Continue your prescribed treatment for this.

## 2020-06-15 ENCOUNTER — Ambulatory Visit (HOSPITAL_COMMUNITY): Payer: 59 | Admitting: Behavioral Health

## 2020-06-15 ENCOUNTER — Encounter (HOSPITAL_COMMUNITY): Payer: Self-pay | Admitting: Emergency Medicine

## 2020-06-15 ENCOUNTER — Other Ambulatory Visit: Payer: Self-pay

## 2020-06-15 DIAGNOSIS — Z5321 Procedure and treatment not carried out due to patient leaving prior to being seen by health care provider: Secondary | ICD-10-CM | POA: Diagnosis not present

## 2020-06-15 DIAGNOSIS — F10239 Alcohol dependence with withdrawal, unspecified: Secondary | ICD-10-CM | POA: Diagnosis not present

## 2020-06-15 DIAGNOSIS — R252 Cramp and spasm: Secondary | ICD-10-CM | POA: Diagnosis not present

## 2020-06-15 NOTE — ED Triage Notes (Addendum)
Pt requesting alcohol detox and water. Also reports leg cramps.

## 2020-06-16 ENCOUNTER — Emergency Department (HOSPITAL_COMMUNITY)
Admission: EM | Admit: 2020-06-16 | Discharge: 2020-06-16 | Disposition: A | Discharge status: Home / Self Care | Payer: 59 | Attending: Emergency Medicine | Admitting: Emergency Medicine

## 2020-06-16 ENCOUNTER — Ambulatory Visit: Payer: Self-pay | Admitting: *Deleted

## 2020-06-16 NOTE — ED Notes (Signed)
Pt told registration "I'll just go home and withdraw" and walked out

## 2020-06-17 ENCOUNTER — Encounter (HOSPITAL_COMMUNITY): Payer: Self-pay

## 2020-06-17 ENCOUNTER — Other Ambulatory Visit: Payer: Self-pay

## 2020-06-17 ENCOUNTER — Ambulatory Visit (HOSPITAL_COMMUNITY)
Admission: EM | Admit: 2020-06-17 | Discharge: 2020-06-17 | Disposition: A | Discharge status: Home / Self Care | Payer: 59 | Attending: Psychiatry | Admitting: Psychiatry

## 2020-06-17 DIAGNOSIS — F1020 Alcohol dependence, uncomplicated: Secondary | ICD-10-CM

## 2020-06-17 MED ORDER — SUCRALFATE 1 G PO TABS: 1.0000 g | ORAL_TABLET | Freq: Three times a day (TID) | ORAL | 0 refills | Status: DC

## 2020-06-17 MED ORDER — OMEPRAZOLE 20 MG PO CPDR: 20.0000 mg | DELAYED_RELEASE_CAPSULE | Freq: Every day | ORAL | 0 refills | Status: DC

## 2020-06-17 MED ORDER — FENOFIBRATE 160 MG PO TABS: 160.0000 mg | ORAL_TABLET | Freq: Every day | ORAL | 0 refills | Status: DC

## 2020-06-17 MED ORDER — ATENOLOL 25 MG PO TABS: 25.0000 mg | ORAL_TABLET | Freq: Every day | ORAL | 0 refills | Status: AC

## 2020-06-17 MED ORDER — FENOFIBRATE 160 MG PO TABS: 160.0000 mg | ORAL_TABLET | Freq: Every day | ORAL | 0 refills | Status: AC

## 2020-06-17 MED ORDER — CHLORDIAZEPOXIDE HCL 25 MG PO CAPS
25.0000 mg | ORAL_CAPSULE | Freq: Once | ORAL | Status: AC
  Administered 2020-06-17: 25 mg via ORAL
  Filled 2020-06-17: qty 1

## 2020-06-17 NOTE — ED Notes (Addendum)
Patient arrives via EMS, per EMS patient called because he drank 3 hours ago and felt shaky and wanted help detoxing. Blood sugar in route 118, other VS within normal range.  Patient was discharged from [facility] last evening for alcohol detox, stated 'they didn't do anything for me'. After stating this with EMS present patient then denied he was at the ED tonight.  Patient denies SI/HI/AVH Patient states he had an OP appt 1/3 but didn't go because he was drinking.

## 2020-06-17 NOTE — ED Provider Notes (Signed)
[facility] Medical Screening Exam  Patient Name: Nicolas [name] MRN: [id number] Date of Evaluation: 06/17/20 Chief Complaint:   Diagnosis:  Final diagnoses:  Alcohol use disorder, severe, dependence (HCC)    History of Present illness: Nicolas [name] is a 55 y.o. adult.  Patient presents voluntarily as a walk-in to the [facility] reporting alcohol abuse and depressive symptoms.  Patient reports that he has no last drink was approximately 2:30 this morning and he is starting to feel withdrawal symptoms and that he feels "like crap."  Patient reports that over the last 9 days his alcohol is increased where he is drinking 12 beers a day.  He states he has been to multiple places for substance abuse treatment and then when discharge he ends up relapsing again.  He states that he is looking to restart treatment and to try to feel better.  He states that he has even had the Vivitrol injection which caused him to have significant muscle pain in his right hip down to his right knee.  Patient reports that he is interested anywhere that he can be accepted to to get back into substance abuse treatment.  Patient reported that he had been to [facility] in the past.  Social work contacted [facility] and patient was accepted back to their program.  Patient stated he needed to go home and pick up his belongings and the staff at [facility] stated that would come pick him up and transport him to their facility patient had denied any suicidal or homicidal ideations and denied any hallucinations.  Due to patient's reports of withdrawal symptoms, sweating, mildly elevated blood pressure patient was provided with a single dose of Librium 25 mg p.o. while he was waiting for resources to be established.  Patient was also provided with prescriptions for his fenofibrate, atenolol, Prilosec, and Carafate.  Patient was discharged with plan to return home and get his belongings and to be transported to [facility] for treatment.  Psychiatric Specialty Exam  Presentation  General Appearance:Appropriate for Environment; Casual; Fairly Groomed  Eye Contact:Good  Speech:Clear and Coherent; Normal Rate  Speech Volume:Normal  Handedness:Right   Mood and Affect  Mood:Anxious  Affect:Appropriate; Congruent   Thought Process  Thought Processes:Coherent  Descriptions of Associations:Intact  Orientation:Full (Time, Place and Person)  Thought Content:WDL  Hallucinations:None  Ideas of Reference:None  Suicidal Thoughts:No  Homicidal Thoughts:No   Sensorium  Memory:Immediate Good; Recent Good; Remote Good  Judgment:Fair  Insight:Fair   Executive Functions  Concentration:Good  Attention Span:Good  Recall:Good  Fund of Knowledge:Good  Language:Good   Psychomotor Activity  Psychomotor Activity:Normal   Assets  Assets:Communication Skills; Desire for Improvement; Financial Resources/Insurance   Sleep  Sleep:Good  Number of hours: No data recorded  Physical Exam: Physical Exam Vitals and nursing note reviewed.  Constitutional:      Appearance: Nicolas [name] is well-developed.  HENT:     Head: Normocephalic.  Eyes:     Pupils: Pupils are equal, round, and reactive to light.  Cardiovascular:     Rate and Rhythm: Normal rate.  Pulmonary:     Effort: Pulmonary effort is normal.  Musculoskeletal:        General: Normal range of motion.  Neurological:     Mental Status: Nicolas [name] is alert and oriented to person, place, and time.    Review of Systems  Constitutional: Negative.   HENT: Negative.   Eyes: Negative.   Respiratory: Negative.   Cardiovascular: Negative.  Gastrointestinal: Negative.   Genitourinary: Negative.   Musculoskeletal: Negative.   Skin: Negative.   Neurological: Negative.   Endo/Heme/Allergies: Negative.   Psychiatric/Behavioral: Positive for depression and substance abuse.   Blood pressure (!) 137/92, pulse 84,  temperature (!) 97.1 F (36.2 C), temperature source Temporal, resp. rate 18, SpO2 (!) 1 %. There is no height or weight on file to calculate BMI.  Musculoskeletal: Strength & Muscle Tone: within normal limits Gait & Station: normal Patient leans: N/A   [facility] MSE Discharge Disposition for Follow up and Recommendations: Based on my evaluation the patient does not appear to have an emergency medical condition and can be discharged with resources and follow up care in outpatient services for detox and substance abuse treatment   Maryfrances Bunnell, FNP 06/17/2020, 8:40 AM

## 2020-06-17 NOTE — Discharge Instructions (Addendum)

## 2020-06-17 NOTE — Progress Notes (Signed)
CSW spoke with Nehemiah Settle at [facility] [phone]). They have detox beds available at their facility and completed a phone interview with pt. They request that pt go home and that they will provide transportation to be picked up there and brought to their facility. Pt agreed to this plan.    Wells Guiles, MSW, LCSW, LCAS Clinical Social Worker II Disposition CSW [phone]

## 2020-06-17 NOTE — ED Notes (Signed)
Patient discharge home. Pt denies SI/HI. AVS reviewed/prescriptions/follow-up reviewed with pt and written copy given to patient. Patient verbalized understanding.

## 2020-06-17 NOTE — Discharge Summary (Signed)
[name] to be D/C'd Home per NP order.  Discussed with the patient and all questions fully answered.  An After Visit Summary was printed and given to the patient. Resources for outpatient services given.  D/c education completed with patient including follow up instructions, medication list, d/c activities limitations if indicated, with other d/c instructions as indicated by NP - patient able to verbalize understanding, all questions fully answered.   Patient instructed to return to ED, call 911, or call NP for any changes in condition.   Patient escorted home [facility]IA [facility].  Nicolas [name] 06/17/2020 9:48 AM

## 2020-06-17 NOTE — BH Assessment (Addendum)
Comprehensive Clinical Assessment (CCA) Note  06/17/2020 Nicolas [name] [id number]  Visit Diagnosis: Alcohol use disorder, severe  Disposition: Reola Calkins, NP recommends discharge with resources. Pt to follow up with detox and substance abuse outpatient services   Nicolas [name] is a 55 year old single male with a history of alcohol abuse who presents voluntarily to [facility] via EMS for assessment.  Pt reports feeling anxious and uncomfortable withdrawing from alcohol. He states he has been drinking at least 20 beers daily x 8 days after recent tx and 3 weeks of sobriety.    Pt is restless and anxious on arrival with somatic focus and reporting 3 hours since last drink.  He is requesting detox treatment.  Pt has had several recent ED visits. Pt states he is disappointed in himself for relapsing. He reports he has strong social support network, including AA friends.   Pt denies SI, HI and AVH.    Chief Complaint:  Chief Complaint  Patient presents with  . Alcohol Intoxication  . Alcohol Problem      CCA Screening, Triage and Referral (STR)  Patient Reported Information How did you hear about [place]? Family/Friend (Phreesia 06/17/2020)  Referral name: [facility] (Phreesia 06/17/2020)  Referral phone number: No data recorded  Whom do you see for routine medical problems? Primary Care (Phreesia 06/17/2020)  Practice/Facility Name: [facility] (Phreesia 06/17/2020)  Practice/Facility Phone Number: No data recorded Name of Contact: Turkey Rankins (Phreesia 06/17/2020)  Contact Number: No data recorded Contact Fax Number: No data recorded Prescriber Name: Beverley Fiedler (Phreesia 06/17/2020)  Prescriber Address (if known): No data recorded  What Is the Reason for Your Visit/Call Today? Detox (Phreesia 06/17/2020)  How Long Has This Been Causing You Problems? > than 6 months (Phreesia 06/17/2020)  What Do You Feel Would Help You the Most Today?  Therapy (Phreesia 06/17/2020)   Have You Recently Been in Any Inpatient Treatment (Hospital/Detox/Crisis Center/28-Day Program)? Yes (Phreesia 06/17/2020)  Name/Location of Program/Hospital:[facility] (Phreesia 06/17/2020)  How Long Were You There? 2 Weeks (Phreesia 06/17/2020)  When Were You Discharged? No data recorded  Have You Ever Received Services From [facility] Before? Yes (Phreesia 06/17/2020)  Who Do You See at [facility]? ed (Phreesia 06/17/2020)   Have You Recently Had Any Thoughts About Hurting Yourself? No (Phreesia 06/17/2020)  Are You Planning to Commit Suicide/Harm Yourself At This time? No (Phreesia 06/17/2020)   Have you Recently Had Thoughts About Hurting Someone Karolee Ohs? No (Phreesia 06/17/2020)  Explanation: No data recorded  Have You Used Any Alcohol or Drugs in the Past 24 Hours? Yes (Phreesia 06/17/2020)  How Long Ago Did You Use Drugs or Alcohol? 1530  What Did You Use and How Much? 3 Hours (Phreesia 06/17/2020)   Do You Currently Have a Therapist/Psychiatrist? No (Phreesia 06/17/2020)  Name of Therapist/Psychiatrist: No data recorded  Have You Been Recently Discharged From Any Office Practice or Programs? No (Phreesia 06/17/2020)  Explanation of Discharge From Practice/Program: No data recorded    CCA Screening Triage Referral Assessment Type of Contact: Face-to-Face  Date Telepsych consult ordered in [facility]:   (05/04/2020)  Time Telepsych consult ordered in [facility]:  1856   Patient Reported Information Reviewed? Yes   Collateral Involvement: N/A   Name and Contact of Legal Guardian: Self.   If Minor and Not Living with Parent(s), Who has Custody? -- (n/a)  Is CPS involved or ever been involved? Never  Is APS involved or ever been involved? Never  Patient Determined To Be At Risk for Harm To Self or Others Based on Review of Patient Reported Information or Presenting Complaint? No   Location of Assessment: [facility]  Assessment Services   Does Patient Present under Involuntary Commitment? No   [place] of Residence: [place]   Patient Currently Receiving the Following Services: Not Receiving Services   Determination of Need: Urgent (48 hours)   Options For Referral: -- (detox inpt)     CCA Biopsychosocial Intake/Chief Complaint:  EMS patient called because he drank 3 hours ago and felt shaky and wanted help detoxing  Current Symptoms/Problems: anxiety   Patient Reported Schizophrenia/Schizoaffective Diagnosis in Past: No   Strengths: 3 weeks sober (almost 30 days) after tx and AA  Preferences: medicated detox  Abilities: No data recorded  Type of Services Patient Feels are Needed: medication to assist with detox   Initial Clinical Notes/Concerns: anxious   Mental Health Symptoms Depression:  Increase/decrease in appetite   Duration of Depressive symptoms: Less than two weeks   Mania:  N/A   Anxiety:   Difficulty concentrating; Irritability; Restlessness; Tension; Worrying; Sleep   Psychosis:  None   Duration of Psychotic symptoms: No data recorded  Trauma:  N/A   Obsessions:  N/A   Compulsions:  N/A   Inattention:  N/A   Hyperactivity/Impulsivity:  N/A   Oppositional/Defiant Behaviors:  N/A   Emotional Irregularity:  N/A   Other Mood/Personality Symptoms:  No data recorded   Mental Status Exam Appearance and self-care  Stature:  Average   Weight:  Average weight   Clothing:  Disheveled; Dirty   Grooming:  Neglected   Cosmetic use:  No data recorded  Posture/gait:  Tense   Motor activity:  Restless   Sensorium  Attention:  Distractible   Concentration:  Anxiety interferes   Orientation:  X5   Recall/memory:  Normal   Affect and Mood  Affect:  Anxious   Mood:  Anxious   Relating  Eye contact:  Normal   Facial expression:  Tense; Anxious   Attitude toward examiner:  Cooperative   Thought and Language  Speech flow: Clear and  Coherent   Thought content:  Appropriate to Mood and Circumstances   Preoccupation:  Somatic   Hallucinations:  None   Organization:  No data recorded  [employer] of Knowledge:  Average   Intelligence:  Average   Abstraction:  Normal   Judgement:  Fair   Dance movement psychotherapist:  Realistic   Insight:  Gaps   Decision Making:  Normal   Social Functioning  Social Maturity:  Isolates; Responsible (isolates when drinking)   Social Judgement:  Normal   Stress  Stressors:  Other (Comment) (relapse)   Coping Ability:  Resilient   Skill Deficits:  None   Supports:  Family; Friends/Service system; Other (Comment) (AA)     Leisure/Recreation: Leisure / Recreation Do You Have Hobbies?: Yes 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 withdrawl from alcohol preventing uninterrupted sleep   CCA Employment/Education Employment/Work Situation: Employment / Work Environmental consultant job has been impacted by current illness: Yes Describe how patient's job has been impacted: Pt is a self-employed Multimedia programmer: Education Is Patient Currently Attending School?: No   CCA Family/Childhood  History Family and Relationship History: Family history Marital status: Single Does patient have children?: No  Childhood History:  Childhood History Does patient have siblings?: Yes Number of Siblings: 2 Did patient suffer any verbal/emotional/physical/sexual abuse as a child?: No Did patient suffer from severe childhood neglect?: No Has patient ever been sexually abused/assaulted/raped as an adolescent or adult?: No   CCA Substance Use Alcohol/Drug Use: Alcohol / Drug Use Pain  Medications: See MAR Prescriptions: See MAR Over the Counter: See MAR History of alcohol / drug use?: Yes Longest period of sobriety (when/how long): has been drinking at least 20 beers daily x 8 days after 3 weeks of sobriety Negative Consequences of Use: Financial,Personal relationships,Work / School Withdrawal Symptoms: Fever / Chills,Nausea / Vomiting,Agitation,Patient aware of relationship between substance abuse and physical/medical complications,Sweats Substance #1 Name of Substance 1: alcohol- beer 1 - Amount (size/oz): at least 20 1 - Frequency: daily 1 - Duration: 8 days 1 - Last Use / Amount: 4-5 hours ago      ASAM's:  Six Dimensions of Multidimensional Assessment  Dimension 1:  Acute Intoxication and/or Withdrawal Potential:   Dimension 1:  Description of individual's past and current experiences of substance use and withdrawal: gets very anxious and uncomfortable  Dimension 2:  Biomedical Conditions and Complications:   Dimension 2:  Description of patient's biomedical conditions and  complications: somatic  Dimension 3:  Emotional, Behavioral, or Cognitive Conditions and Complications:  Dimension 3:  Description of emotional, behavioral, or cognitive conditions and complications: has friends/supports in AA  Dimension 4:  Readiness to Change:  Dimension 4:  Description of Readiness to Change criteria: requesting detox/tx. disappointed he relapsed  Dimension 5:  Relapse, Continued use, or Continued Problem Potential:  Dimension 5:  Relapse, continued use, or continued problem potential critiera description: no significant MH impairment  Dimension 6:  Recovery/Living Environment:  Dimension 6:  Recovery/Iiving environment criteria description: supportive family, friends, AA  ASAM Severity Score: ASAM's Severity Rating Score: 5  ASAM Recommended Level of Treatment: ASAM Recommended Level of Treatment: Level I Outpatient Treatment   Substance use Disorder (SUD) Substance Use  Disorder (SUD)  Checklist Symptoms of Substance Use: Continued use despite persistent or recurrent social, interpersonal problems, caused or exacerbated by use,Evidence of tolerance,Evidence of withdrawal (Comment),Persistent desire or unsuccessful efforts to cut down or control use,Recurrent use that results in a failure to fulfill major role obligations (work, school, home),Social, occupational, recreational activities given up or reduced due to use,Substance(s) often taken in larger amounts or over longer times than was intended  Recommendations for Services/Supports/Treatments: Recommendations for Services/Supports/Treatments Recommendations For Services/Supports/Treatments: Detox,CD-IOP Intensive Chemical Dependency Program  DSM5 Diagnoses: Patient Active Problem List   Diagnosis Date Noted  . Pancreatitis 05/10/2020  . Acute alcoholic pancreatitis 73/42/8768  . GERD (gastroesophageal reflux disease) 11/03/2019  . Alcoholic hepatitis 11/57/2620  . Major depressive disorder, recurrent episode, severe ([place]) 10/21/2019  . Alcoholic pancreatitis 35/59/7416  . Acute pancreatitis 10/20/2019  . Alcohol use disorder, severe, dependence ([place]) 10/16/2019   Disposition: Marvia Pickles, NP recommends discharge with resources. Pt to follow up with detox and substance abuse outpatient services

## 2020-06-19 ENCOUNTER — Ambulatory Visit: Payer: Self-pay | Admitting: *Deleted

## 2020-06-23 ENCOUNTER — Telehealth (HOSPITAL_COMMUNITY): Payer: Self-pay | Admitting: Family Medicine

## 2020-06-23 NOTE — Telephone Encounter (Signed)
Care Management - Follow Up [facility] Discharges   Writer attempted to make contact with patient today and was unsuccessful.    Per chart review, patient was discharged to [facility] at [facility] Rolene Arbour [phone]).

## 2020-06-24 ENCOUNTER — Ambulatory Visit: Payer: Self-pay | Admitting: *Deleted

## 2020-07-01 ENCOUNTER — Ambulatory Visit (HOSPITAL_COMMUNITY)
Admission: EM | Admit: 2020-07-01 | Discharge: 2020-07-02 | Disposition: A | Discharge status: Home / Self Care | Payer: 59 | Attending: Nurse Practitioner | Admitting: Nurse Practitioner

## 2020-07-01 ENCOUNTER — Other Ambulatory Visit: Payer: Self-pay

## 2020-07-01 DIAGNOSIS — F1020 Alcohol dependence, uncomplicated: Secondary | ICD-10-CM

## 2020-07-01 LAB — POCT URINE DRUG SCREEN - MANUAL ENTRY (I-SCREEN)
POC Amphetamine UR: NOT DETECTED
POC Buprenorphine (BUP): NOT DETECTED
POC Cocaine UR: NOT DETECTED
POC Marijuana UR: NOT DETECTED
POC Methadone UR: NOT DETECTED
POC Methamphetamine UR: NOT DETECTED
POC Morphine: NOT DETECTED
POC Oxazepam (BZO): POSITIVE — AB
POC Oxycodone UR: NOT DETECTED
POC Secobarbital (BAR): NOT DETECTED

## 2020-07-01 LAB — POC SARS CORONAVIRUS 2 AG -  ED: SARS Coronavirus 2 Ag: NEGATIVE

## 2020-07-01 MED ORDER — ACETAMINOPHEN 325 MG PO TABS: 650.0000 mg | ORAL_TABLET | Freq: Four times a day (QID) | ORAL | Status: DC | PRN

## 2020-07-01 MED ORDER — ALUM & MAG HYDROXIDE-SIMETH 200-200-20 MG/5ML PO SUSP: 30.0000 mL | ORAL | Status: DC | PRN

## 2020-07-01 MED ORDER — HYDROXYZINE HCL 25 MG PO TABS
25.0000 mg | ORAL_TABLET | Freq: Four times a day (QID) | ORAL | Status: DC | PRN
  Administered 2020-07-01: 25 mg via ORAL
  Filled 2020-07-01: qty 1

## 2020-07-01 MED ORDER — LOPERAMIDE HCL 2 MG PO CAPS: 2.0000 mg | ORAL_CAPSULE | ORAL | Status: DC | PRN

## 2020-07-01 MED ORDER — SUCRALFATE 1 G PO TABS
1.0000 g | ORAL_TABLET | Freq: Three times a day (TID) | ORAL | Status: DC
  Administered 2020-07-01: 1 g via ORAL
  Filled 2020-07-01: qty 1

## 2020-07-01 MED ORDER — THIAMINE HCL 100 MG PO TABS
100.0000 mg | ORAL_TABLET | Freq: Every day | ORAL | Status: DC
  Filled 2020-07-01: qty 1

## 2020-07-01 MED ORDER — FENOFIBRATE 160 MG PO TABS
160.0000 mg | ORAL_TABLET | Freq: Every day | ORAL | Status: DC
  Administered 2020-07-01: 160 mg via ORAL
  Filled 2020-07-01: qty 1

## 2020-07-01 MED ORDER — ATENOLOL 25 MG PO TABS
25.0000 mg | ORAL_TABLET | Freq: Every day | ORAL | Status: DC
  Administered 2020-07-01: 25 mg via ORAL
  Filled 2020-07-01: qty 1

## 2020-07-01 MED ORDER — LORAZEPAM 1 MG PO TABS: 1.0000 mg | ORAL_TABLET | Freq: Four times a day (QID) | ORAL | Status: DC | PRN

## 2020-07-01 MED ORDER — SERTRALINE HCL 50 MG PO TABS
50.0000 mg | ORAL_TABLET | Freq: Every day | ORAL | Status: DC
  Filled 2020-07-01: qty 1

## 2020-07-01 MED ORDER — TRAZODONE HCL 100 MG PO TABS
100.0000 mg | ORAL_TABLET | Freq: Every evening | ORAL | Status: DC | PRN
  Administered 2020-07-02: 100 mg via ORAL
  Filled 2020-07-01: qty 1

## 2020-07-01 MED ORDER — PANTOPRAZOLE SODIUM 40 MG PO TBEC
40.0000 mg | DELAYED_RELEASE_TABLET | Freq: Every day | ORAL | Status: DC
  Administered 2020-07-01: 40 mg via ORAL
  Filled 2020-07-01: qty 1

## 2020-07-01 MED ORDER — ONDANSETRON 4 MG PO TBDP
4.0000 mg | ORAL_TABLET | Freq: Four times a day (QID) | ORAL | Status: DC | PRN
  Administered 2020-07-01: 4 mg via ORAL
  Filled 2020-07-01: qty 1

## 2020-07-01 MED ORDER — MAGNESIUM HYDROXIDE 400 MG/5ML PO SUSP: 30.0000 mL | Freq: Every day | ORAL | Status: DC | PRN

## 2020-07-01 MED ORDER — LORAZEPAM 1 MG PO TABS
1.0000 mg | ORAL_TABLET | Freq: Once | ORAL | Status: AC
  Administered 2020-07-01: 1 mg via ORAL
  Filled 2020-07-01: qty 1

## 2020-07-01 NOTE — ED Triage Notes (Signed)
Patient presents to the [facility] via EMS. Patient requesting Detox from alcohol. Denies SI/ HI and AVH.

## 2020-07-01 NOTE — BH Assessment (Signed)
Comprehensive Clinical Assessment (CCA) Screening, Triage and Referral Note  07/01/2020 Nicolas [name] [id number]  Chief Complaint:  Chief Complaint  Patient presents with  . Alcohol Problem   Visit Diagnosis: F10.20, Alcohol use disorder, Moderate   Nicolas "Merry Proud" Boehringer is a 55 year old patient who arrived to the [facility]) via EMS due to drinking for 5 days (after attending the detox facility [facility] (pt states it wasn't [facility] and repeated the name '[facility]') for 7 days) and determining that he was going to die if he didn't stop. Pt states he is starting to experience physical discomfort and that he feels like dying, though he denies SI. Pt acknowledged he's experienced SI in the past, though he has never attempted to kill himself. Pt denies he currently has a plan to kill himself, though he's noted he's thought about drinking himself to death.  Pt denies HI, AVH, NSSIB, access to guns/weapons, and engagement with the legal system. He denies the use of substances other than EtOH, which he states he drinks approximately 15 - 16 12-ounce beers/day. Pt states he has done detox "many times" in the past and that his longest time in sobriety was 80 days. Pt states he became an alcoholic in his late 80M after he once drank in the morning. He states the longest he's been in treatment was 3 weeks.  Pt denies he has a therapist or a psychiatrist; he states he was on Zoloft until he went to detox but that, since being taken off of the Zoloft, he feels better. Pt lives by himself and is self-employed as a Geophysicist/field seismologist.  Pt's protective factors include no HI and no AVH.  Pt declined to provide clinician verbal consent to contact friends/family members for collateral information.  Pt is oriented x5. His recent/remote memory is intact. Pt was quite dramatic and expressed much self-pity, as demonstrated by pt grabbing his stomach throughout the assessment,  declaring that he's going to be sick numerous times, stating he has to walk around due to discomfort, and refusing to be left by himself. Pt had negative retorts every time clinician attempted to provide positive verbal reinforcement. Pt's insight, judgement, and impulse control is fair - poor at this time.    Lindon Romp, NP, reviewed pt's chart and information and met with pt and determined pt should be observed overnight for safety and stability and re-assessed in the morning by psychiatry due to pt expressing his inability to contract for safety, stating if he returned home he'd most likely drink himself to death.     Patient Reported Information How did you hear about [place]? Other (Comment) (Phreesia 07/01/2020)   Referral name: EMS ([place] 07/01/2020)   Referral phone number: No data recorded Whom do you see for routine medical problems? Primary Care (Phreesia 07/01/2020)   Practice/Facility Name: [facility] ([place] 07/01/2020)   Practice/Facility Phone Number: No data recorded  Name of Contact: Nicolas [name] ([place] 07/01/2020)   Contact Number: No data recorded  Contact Fax Number: No data recorded  Prescriber Name: Na ([place] 07/01/2020)   Prescriber Address (if known): No data recorded What Is the Reason for Your Visit/Call Today? Stop Drinking (Phreesia 07/01/2020)  How Long Has This Been Causing You Problems? > than 6 months (Phreesia 07/01/2020)  Have You Recently Been in Any Inpatient Treatment (Hospital/Detox/Crisis Center/28-Day Program)? Yes (Phreesia 07/01/2020)   Name/Location of Program/Hospital:[facility]  7days (Phreesia 07/01/2020)   How Long Were You There? 7 Days (Phreesia 07/01/2020)  When Were You Discharged? No data recorded Have You Ever Received Services From [facility] Before? Yes (Phreesia 07/01/2020)   Who Do You See at [facility]? Na (Phreesia 07/01/2020)  Have You Recently Had Any Thoughts About Hurting Yourself? No  (Phreesia 07/01/2020)   Are You Planning to Commit Suicide/Harm Yourself At This time?  No (Phreesia 07/01/2020)  Have you Recently Had Thoughts About [place]? No (Phreesia 07/01/2020)   Explanation: No data recorded Have You Used Any Alcohol or Drugs in the Past 24 Hours? Yes (Phreesia 07/01/2020)   How Long Ago Did You Use Drugs or Alcohol?  0000 (Pt estimates around 1700)   What Did You Use and How Much? 12 Drinks (Phreesia 07/01/2020)  What Do You Feel Would Help You the Most Today? Therapy (Phreesia 07/01/2020)  Do You Currently Have a Therapist/Psychiatrist? No (Phreesia 07/01/2020)   Name of Therapist/Psychiatrist: No data recorded  Have You Been Recently Discharged From Any Office Practice or Programs? No (Phreesia 07/01/2020)   Explanation of Discharge From Practice/Program:  No data recorded    CCA Screening Triage Referral Assessment Type of Contact: Face-to-Face   Is this Initial or Reassessment? No data recorded  Date Telepsych consult ordered in [facility]:   (05/04/2020)   Time Telepsych consult ordered in [facility]:  [place]  Patient Reported Information Reviewed? Yes   Patient Left Without Being Seen? No data recorded  Reason for Not Completing Assessment: No data recorded Collateral Involvement: Pt declined to provide verbal consent for clinician to contact friends/family members for collateral.  Does Patient Have a [place]? No data recorded  Name and Contact of Legal Guardian:  Self.   If Minor and Not Living with Parent(s), Who has Custody? N/A  Is CPS involved or ever been involved? Never  Is APS involved or ever been involved? Never  Patient Determined To Be At Risk for Harm To Self or Others Based on Review of Patient Reported Information or Presenting Complaint? No   Method: No data recorded  Availability of Means: No data recorded  Intent: No data recorded  Notification Required: No data recorded  Additional Information  for Danger to Others Potential:  No data recorded  Additional Comments for Danger to Others Potential:  No data recorded  Are There Guns or Other Weapons in Your Home?  No data recorded   Types of Guns/Weapons: No data recorded   Are These Weapons Safely Secured?                              No data recorded   Who Could Verify You Are Able To Have These Secured:    No data recorded Do You Have any Outstanding Charges, Pending Court Dates, Parole/Probation? No data recorded Contacted To Inform of Risk of Harm To Self or Others: Other: Comment (N/A)  Location of Assessment: [facility]  Does Patient Present under Involuntary Commitment? No   IVC Papers Initial File Date: No data recorded  [place] of Residence: [place]  Patient Currently Receiving the Following Services: Not Receiving Services   Determination of Need: Urgent (48 hours)   Options For Referral: -- (detox inpt) Lindon Romp, NP, reviewed pt's chart and information and met with pt and determined pt should be observed overnight for safety and stability and re-assessed in the morning by psychiatry due to pt expressing his inability to contract for safety, stating if he returned home he'd  most likely drink himself to death.  Dannielle Burn, LMFT

## 2020-07-01 NOTE — ED Notes (Signed)
Patient up to counter stating he was sleeping but woke up and is feeling 'worse'. C/O nausea, generalized aching and anxiety. Barbara Cower NP notified. Patient reminded that these are normal symptoms of detoxing off alcohol and we would continue to monitor.

## 2020-07-02 LAB — COMPREHENSIVE METABOLIC PANEL
ALT: 93 U/L — ABNORMAL HIGH (ref 0–44)
AST: 105 U/L — ABNORMAL HIGH (ref 15–41)
Albumin: 4.2 g/dL (ref 3.5–5.0)
Alkaline Phosphatase: 70 U/L (ref 38–126)
Anion gap: 14 (ref 5–15)
BUN: <5 mg/dL — ABNORMAL LOW (ref 6–20)
CO2: 23 mmol/L (ref 22–32)
Calcium: 9.4 mg/dL (ref 8.9–10.3)
Chloride: 101 mmol/L (ref 98–111)
Creatinine, Ser: 0.8 mg/dL (ref 0.61–1.24)
GFR, Estimated: >60 mL/min (ref >60)
Glucose, Bld: 115 mg/dL — ABNORMAL HIGH (ref 70–99)
Potassium: 3.9 mmol/L (ref 3.5–5.1)
Sodium: 138 mmol/L (ref 135–145)
Total Bilirubin: 0.9 mg/dL (ref 0.3–1.2)
Total Protein: 6.8 g/dL (ref 6.5–8.1)

## 2020-07-02 LAB — CBC WITH DIFFERENTIAL/PLATELET
Abs Immature Granulocytes: 0.06 10*3/uL (ref 0.00–0.07)
Basophils Absolute: 0.2 10*3/uL — ABNORMAL HIGH (ref 0.0–0.1)
Basophils Relative: 3 %
Eosinophils Absolute: 0.2 10*3/uL (ref 0.0–0.5)
Eosinophils Relative: 4 %
HCT: 42.8 % (ref 39.0–52.0)
Hemoglobin: 14.8 g/dL (ref 13.0–17.0)
Immature Granulocytes: 1 %
Lymphocytes Relative: 41 %
Lymphs Abs: 2.6 10*3/uL (ref 0.7–4.0)
MCH: 34.5 pg — ABNORMAL HIGH (ref 26.0–34.0)
MCHC: 34.6 g/dL (ref 30.0–36.0)
MCV: 99.8 fL (ref 80.0–100.0)
Monocytes Absolute: 0.7 10*3/uL (ref 0.1–1.0)
Monocytes Relative: 11 %
Neutro Abs: 2.5 10*3/uL (ref 1.7–7.7)
Neutrophils Relative %: 40 %
Platelets: 408 10*3/uL — ABNORMAL HIGH (ref 150–400)
RBC: 4.29 MIL/uL (ref 4.22–5.81)
RDW: 13.2 % (ref 11.5–15.5)
WBC: 6.3 10*3/uL (ref 4.0–10.5)
nRBC: 0.3 % — ABNORMAL HIGH (ref 0.0–0.2)

## 2020-07-02 LAB — ETHANOL: Alcohol, Ethyl (B): 92 mg/dL — ABNORMAL HIGH (ref <10)

## 2020-07-02 NOTE — ED Provider Notes (Signed)
[facility]BC/OBS ASAP Discharge Summary  Date and Time: 07/02/2020 3:16 AM  Name: Nicolas [name]  MRN:  [id number]   Discharge Diagnoses:  Final diagnoses:  Alcohol use disorder, severe, dependence (HCC)    Subjective: Nicolas [name] is a 55 y.o. with a history of alcohol use disorder who presents voluntarily to [facility] with EMS due to requests for detox and substance abuse treatment. Patient reports that he has bee drinking alcohol for the last 5 days after spending 7 days at [facility] detox. Patient reports that he is in extreme discomfort due to alcohol withdrawal. Reports last use of alcohol was 5 pm. He reports that if he is discharged home he will start drinking and become suicidal. He expresses understanding that his continued heavy alcohol use contributes to his reported history of pancreatitis and liver issues. Pt denies HI, AVH, NSSIB, access to guns/weapons, and engagement with the legal system. He denies the use of substances other than EtOH, which he states he drinks approximately 15 - 16 12-ounce beers/day. Pt states he has done detox "many times" in the past and that his longest time in sobriety was 80 days. Pt states he became an alcoholic in his late 87s after he once drank in the morning. He states the longest he's been in treatment was 3 weeks.Chart review indicates that he patient has numerous visits to area emergency departments and various other locations due to alcohol use. Patient is requesting detox. States that he understands that [facility] is not a detox center. He would like assistance with locating a detox center and residential substance abuse treatment.   Stay Summary: Patient admitted for continuous assessment. Approximately 4 hours after admission patient reported that he was not able to tolerated going through withdrawal at this time and requested discharge. Patient denies SI, HI, AVH. Does not appear to be responding to internal stimuli. Patient contacted an Pharmacist, community for transpiration  home.   Total Time spent with patient: 15 minutes  Past Psychiatric History: Alcohol Use Disorder Past Medical History:  Past Medical History:  Diagnosis Date  . Alcohol abuse   . Hypertension   . Pancreatitis     Past Surgical History:  Procedure Laterality Date  . NASAL SEPTUM SURGERY     Family History: No family history on file.  Social History:  Social History   Substance and Sexual Activity  Alcohol Use Yes  . Alcohol/week: 42.0 standard drinks  . Types: 42 Cans of beer per week   Comment: daily use     Social History   Substance and Sexual Activity  Drug Use Not Currently  . Types: Cocaine   Comment: stopped 01/2019    Social History   Socioeconomic History  . Marital status: Single    Spouse name: Not on file  . Number of children: Not on file  . Years of education: Not on file  . Highest education level: Not on file  Occupational History  . Not on file  Tobacco Use  . Smoking status: Current Every Day Smoker    Packs/day: 0.25    Types: Cigarettes  . Smokeless tobacco: Never Used  Vaping Use  . Vaping Use: Never used  Substance and Sexual Activity  . Alcohol use: Yes    Alcohol/week: 42.0 standard drinks    Types: 42 Cans of beer per week    Comment: daily use  . Drug use: Not Currently    Types: Cocaine    Comment: stopped 01/2019  . Sexual activity: Not on  file  Other Topics Concern  . Not on file  Social History Narrative  . Not on file   Social Determinants of Health   Financial Resource Strain: Not on file  Food Insecurity: Not on file  Transportation Needs: No Transportation Needs  . Lack of Transportation (Medical): No  . Lack of Transportation (Non-Medical): No  Physical Activity: Not on file  Stress: Not on file  Social Connections: Not on file   SDOH:  SDOH Screenings   Alcohol Screen: Not on file  Depression (PHQ2-9): Medium Risk  . PHQ-2 Score: 12  Financial Resource Strain: Not on file  Food Insecurity: Not on file   Housing: Not on file  Physical Activity: Not on file  Social Connections: Not on file  Stress: Not on file  Tobacco Use: High Risk  . Smoking Tobacco Use: Current Every Day Smoker  . Smokeless Tobacco Use: Never Used  Transportation Needs: No Transportation Needs  . Lack of Transportation (Medical): No  . Lack of Transportation (Non-Medical): No    Has this patient used any form of tobacco in the last 30 days? (Cigarettes, Smokeless Tobacco, Cigars, and/or Pipes) A prescription for an FDA-approved tobacco cessation medication was offered at discharge and the patient refused  Current Medications:  Current Facility-Administered Medications  Medication Dose Route Frequency Provider Last Rate Last Admin  . acetaminophen (TYLENOL) tablet 650 mg  650 mg Oral Q6H PRN Nira Conn A, NP      . alum & mag hydroxide-simeth (MAALOX/MYLANTA) 200-200-20 MG/5ML suspension 30 mL  30 mL Oral Q4H PRN Nira Conn A, NP      . atenolol (TENORMIN) tablet 25 mg  25 mg Oral Daily Nira Conn A, NP   25 mg at 07/01/20 2205  . fenofibrate tablet 160 mg  160 mg Oral Daily Nira Conn A, NP   160 mg at 07/01/20 2205  . hydrOXYzine (ATARAX/VISTARIL) tablet 25 mg  25 mg Oral Q6H PRN Nira Conn A, NP   25 mg at 07/01/20 2207  . loperamide (IMODIUM) capsule 2-4 mg  2-4 mg Oral PRN Nira Conn A, NP      . LORazepam (ATIVAN) tablet 1 mg  1 mg Oral Q6H PRN Nira Conn A, NP      . magnesium hydroxide (MILK OF MAGNESIA) suspension 30 mL  30 mL Oral Daily PRN Nira Conn A, NP      . ondansetron (ZOFRAN-ODT) disintegrating tablet 4 mg  4 mg Oral Q6H PRN Nira Conn A, NP   4 mg at 07/01/20 2150  . pantoprazole (PROTONIX) EC tablet 40 mg  40 mg Oral Daily Nira Conn A, NP   40 mg at 07/01/20 2205  . sertraline (ZOLOFT) tablet 50 mg  50 mg Oral Daily Nira Conn A, NP      . sucralfate (CARAFATE) tablet 1 g  1 g Oral TID WC & HS Nira Conn A, NP   1 g at 07/01/20 2150  . thiamine tablet 100 mg  100 mg Oral  Daily Nira Conn A, NP      . traZODone (DESYREL) tablet 100 mg  100 mg Oral QHS PRN Nira Conn A, NP   100 mg at 07/02/20 0007   Current Outpatient Medications  Medication Sig Dispense Refill  . atenolol (TENORMIN) 25 MG tablet Take 1 tablet (25 mg total) by mouth daily. 30 tablet 0  . fenofibrate 160 MG tablet Take 1 tablet (160 mg total) by mouth daily. 30 tablet 0  .  hydrOXYzine (VISTARIL) 25 MG capsule Take 25 mg by mouth in the morning and at bedtime.    Marland Kitchen omeprazole (PRILOSEC) 20 MG capsule Take 1 capsule (20 mg total) by mouth daily. 30 capsule 0  . sertraline (ZOLOFT) 25 MG tablet Take 2 tablets (50 mg total) by mouth daily. 60 tablet 0  . sucralfate (CARAFATE) 1 g tablet Take 1 tablet (1 g total) by mouth 4 (four) times daily -  with meals and at bedtime for 14 days. 56 tablet 0    PTA Medications: (Not in a hospital admission)   Musculoskeletal  Strength & Muscle Tone: within normal limits Gait & Station: normal Patient leans: N/A  Psychiatric Specialty Exam  Presentation  General Appearance: Appropriate for Environment; Casual; Fairly Groomed  Eye Contact:Good  Speech:Clear and Coherent; Normal Rate  Speech Volume:Normal  Handedness:Right   Mood and Affect  Mood:Anxious  Affect:Appropriate; Congruent   Thought Process  Thought Processes:Coherent  Descriptions of Associations:Intact  Orientation:Full (Time, Place and Person)  Thought Content:WDL  Hallucinations:No data recorded Ideas of Reference:None  Suicidal Thoughts:No data recorded Homicidal Thoughts:No data recorded  Sensorium  Memory:Immediate Good; Recent Good; Remote Good  Judgment:Fair  Insight:Fair   Executive Functions  Concentration:Good  Attention Span:Good  Recall:Good  Fund of Knowledge:Good  Language:Good   Psychomotor Activity  Psychomotor Activity:No data recorded  Assets  Assets:Communication Skills; Desire for Improvement; Financial  Resources/Insurance   Sleep  Sleep:No data recorded  Physical Exam  Physical Exam Constitutional:      General: Nicolas [name] is not in acute distress.    Appearance: Nicolas [name] is not ill-appearing, toxic-appearing or diaphoretic.  HENT:     Head: Normocephalic.     Right Ear: External ear normal.     Left Ear: External ear normal.  Eyes:     Pupils: Pupils are equal, round, and reactive to light.  Cardiovascular:     Rate and Rhythm: Normal rate.  Pulmonary:     Effort: Pulmonary effort is normal. No respiratory distress.  Musculoskeletal:        General: Normal range of motion.  Skin:    General: Skin is warm and dry.  Neurological:     Mental Status: Nicolas [name] is alert and oriented to person, place, and time.  Psychiatric:        Mood and Affect: Mood is anxious and depressed.        Speech: Speech normal.        Behavior: Behavior is cooperative.        Thought Content: Thought content is not paranoid or delusional. Thought content does not include homicidal or suicidal ideation. Thought content does not include suicidal plan.    Review of Systems  Constitutional: Negative for chills, diaphoresis, fever, malaise/fatigue and weight loss.  HENT: Negative for congestion.   Respiratory: Negative for cough and shortness of breath.   Cardiovascular: Negative for chest pain and palpitations.  Gastrointestinal: Negative for diarrhea, nausea and vomiting.  Neurological: Negative for dizziness and seizures.  Psychiatric/Behavioral: Positive for depression and substance abuse. Negative for hallucinations and memory loss. The patient is nervous/anxious and has insomnia.   All other systems reviewed and are negative.  Blood pressure 110/84, pulse 86, temperature 98.6 F (37 C), temperature source Oral, resp. rate 18, SpO2 98 %. There is no height or weight on file to calculate BMI.  Demographic Factors:  Male and Caucasian  Loss Factors: NA  Historical  Factors: Family history of mental illness or substance abuse  Risk Reduction Factors:   Religious beliefs about death  Continued Clinical Symptoms:  Alcohol/Substance Abuse/Dependencies  Cognitive Features That Contribute To Risk:  None    Suicide Risk:  Mild:  Suicidal ideation of limited frequency, intensity, duration, and specificity.  There are no identifiable plans, no associated intent, mild dysphoria and related symptoms, good self-control (both objective and subjective assessment), few other risk factors, and identifiable protective factors, including available and accessible social support.  Disposition: Patient does not meet criteria for psychiatric inpatient admission. Supportive therapy provided about ongoing stressors. Discussed crisis plan, support from social network, calling 911, coming to the Emergency Department, and calling Suicide Hotline.  Nicolas PolingJason A Helma Argyle, NP 07/02/2020, 3:16 AM

## 2020-07-02 NOTE — ED Notes (Signed)
After multiple long conversations patient stated that he wanted to leave. Feels that the medications here are not working well enough and he is too miserable to be able to be comfortable while detoxing here. Patient informed rights and chose to sign AMA paper. Belongings returned and patient escorted to lobby where he stated he would call for a ride. Patient reminded he can return to this or any [facility] facility for care even with signing out AMA. Patient encouraged to refrain from drinking more alcohol at this time.

## 2020-07-02 NOTE — Discharge Instructions (Addendum)

## 2020-07-02 NOTE — ED Provider Notes (Signed)
Behavioral Health Admission H&P [facility](FBC & OBS)  Date: 07/02/20 Patient Name: Nicolas [name] MRN: [id number] Chief Complaint:  Chief Complaint  Patient presents with  . Alcohol Problem      Diagnoses:  Final diagnoses:  Alcohol use disorder, severe, dependence (HCC)    HPI: Nicolas [name] is a 55 y.o. with a history of alcohol use disorder who [facility] with EMS due to requests for detox and substance abuse treatment. Patient reports that he has bee drinking alcohol for the last 5 days after spending 7 days at [facility] detox. Patient reports that he is in extreme discomfort due to alcohol withdrawal. Reports last use of alcohol was 5 pm. He reports that if he is discharged home he will start drinking and become suicidal. He expresses understanding that his continued heavy alcohol use contributes to his reported history of pancreatitis and liver issues. Pt denies HI, AVH, NSSIB, access to guns/weapons, and engagement with the legal system. He denies the use of substances other than EtOH, which he states he drinks approximately 15 - 16 12-ounce beers/[name]. Pt states he has done detox "many times" in the past and that his longest time in sobriety was 80 days. Pt states he became an alcoholic in his late 2140s after he once drank in the morning. He states the longest he's been in treatment was 3 weeks.  Reviewed TTS assessment and validated with patient. On evaluation patient is alert and oriented x 4, pleasant, and cooperative. Speech is clear and coherent. Mood is depressed and affect is congruent with mood. Thought process is coherent and thought content is logical. Denies auditory and visual hallucinations. No indication that patient is responding to internal stimuli. No evidence of delusional thought content.  Denies homicidal ideations.  Chart review indicates that he patient has numerous visits to area emergency departments and various other locations due to alcohol use.    Patient is requesting detox. States that [facility]. He would like assistance with locating a detox center and residential substance abuse treatment.   PHQ 2-9:  Flow[facility]  Thoughts that you would be better off dead, or of hurting yourself in some way Not at all  [facility][Phreesia 07/01/2020]  PHQ-9 Total Score 12      Flowsheet Row [facility] Hosp-Ad[facility] (Discharged) from [facility]/09/2020 [facility] from 05/03/2020 in [facility]  C-SSRS RISK CATEGORY Low Risk Error: Question 6 not populated High Risk       Total Time spent with patient: 20 minutes  Musculoskeletal  Strength & Muscle Tone: within normal limits Gait & Station: normal Patient leans: N/A  Psychiatric Specialty Exam  Presentation General Appearance: Appropriate for Environment; Casual; Fairly Groomed  Eye Contact:Good  Speech:Clear and Coherent; Normal Rate  Speech Volume:Normal  Handedness:Right   Mood and Affect  Mood:Anxious  Affect:Appropriate; Congruent   Thought Process  Thought Processes:Coherent  Descriptions of Associations:Intact  Orientation:Full (Time, Place and Person)  Thought Content:WDL  Hallucinations:No data recorded Ideas of Reference:None  Suicidal Thoughts:No data recorded Homicidal Thoughts:No data recorded  Sensorium  Memory:Immediate Good; Recent Good; Remote Good  Judgment:Fair  Insight:Fair   Executive Functions  Concentration:Good  Attention Span:Good  Recall:Good  Fund of Knowledge:Good  Language:Good   Psychomotor Activity  Psychomotor Activity:No data recorded  Assets  Assets:Communication Skills; Desire for Improvement;  Financial Resources/Insurance   Sleep  Sleep:No data recorded  Physical Exam Constitutional:      General:  Nicolas [name] is not in acute distress.    Appearance: Nicolas [name] is not ill-appearing, toxic-appearing or diaphoretic.  HENT:     Head: Normocephalic.     Right Ear: External ear normal.     Left Ear: External ear normal.  Eyes:     Pupils: Pupils are equal, round, and reactive to light.  Cardiovascular:     Rate and Rhythm: Normal rate.  Pulmonary:     Effort: Pulmonary effort is normal. No respiratory distress.  Musculoskeletal:        General: Normal range of motion.  Neurological:     General: No focal deficit present.     Mental Status: Nicolas [name] is alert and oriented to person, place, and time.  Psychiatric:        Mood and Affect: Mood is anxious and depressed.        Speech: Speech normal.        Behavior: Behavior is cooperative.        Thought Content: Thought content is not paranoid or delusional. Thought content does not include homicidal ideation.    Review of Systems  Constitutional: Negative for chills, diaphoresis, fever, malaise/fatigue and weight loss.  HENT: Negative for congestion.   Respiratory: Negative for cough and shortness of breath.   Cardiovascular: Negative for chest pain and palpitations.  Gastrointestinal: Negative for diarrhea, nausea and vomiting.  Neurological: Negative for dizziness and seizures.  Psychiatric/Behavioral: Positive for depression, substance abuse and suicidal ideas. Negative for hallucinations and memory loss. The patient is nervous/anxious and has insomnia.   All other systems reviewed and are negative.   Blood pressure 110/84, pulse 86, temperature 98.6 F (37 C), temperature source Oral, resp. rate 18, SpO2 98 %. There is no height or weight on file to calculate BMI.  Past Psychiatric History: Alcohol Use Disorder  Is the patient at risk to self? Yes severe alcohol use Has the patient been a risk to self in the past 6 months? Yes .    Has the patient been a risk to self within the distant past? Yes   Is the  patient a risk to others? No   Has the patient been a risk to others in the past 6 months? No   Has the patient been a risk to others within the distant past? No   Past Medical History:  Past Medical History:  Diagnosis Date  . Alcohol abuse   . Hypertension   . Pancreatitis     Past Surgical History:  Procedure Laterality Date  . NASAL SEPTUM SURGERY      Family History: No family history on file.  Social History:  Social History   Socioeconomic History  . Marital status: Single    Spouse name: Not on file  . Number of children: Not on file  . Years of education: Not on file  . Highest education level: Not on file  Occupational History  . Not on file  Tobacco Use  . Smoking status: Current Every [name] Smoker    Packs/[name]: 0.25    Types: Cigarettes  . Smokeless tobacco: Never Used  Vaping Use  . Vaping Use: Never used  Substance and Sexual Activity  . Alcohol use: Yes    Alcohol/week: 42.0 standard drinks    Types: 42 Cans of beer per week    Comment: daily use  . Drug use:  Not Currently    Types: Cocaine    Comment: stopped 01/2019  . Sexual activity: Not on file  Other Topics Concern  . Not on file  Social History Narrative  . Not on file   Social Determinants of Health   Financial Resource Strain: Not on file  Food Insecurity: Not on file  Transportation Needs: No Transportation Needs  . Lack of Transportation (Medical): No  . Lack of Transportation (Non-Medical): No  Physical Activity: Not on file  Stress: Not on file  Social Connections: Not on file  Intimate Partner Violence: Not on file    SDOH:  SDOH Screenings   Alcohol Screen: Not on file  Depression (PHQ2-9): Medium Risk  . PHQ-2 Score: 12  Financial Resource Strain: Not on file  Food Insecurity: Not on file  Housing: Not on file  Physical Activity: Not on file  Social Connections: Not on file  Stress: Not on file  Tobacco Use: High Risk  . Smoking Tobacco Use: Current Every [name]  Smoker  . Smokeless Tobacco Use: Never Used  Transportation Needs: No Transportation Needs  . Lack of Transportation (Medical): No  . Lack of Transportation (Non-Medical): No    Last Labs:  Admission on 07/01/2020  Component Date Value Ref Range Status  . POC Amphetamine UR 07/01/2020 None Detected  NONE DETECTED (Cut Off Level 1000 ng/mL) Final  . POC Secobarbital (BAR) 07/01/2020 None Detected  NONE DETECTED (Cut Off Level 300 ng/mL) Final  . POC Buprenorphine (BUP) 07/01/2020 None Detected  NONE DETECTED (Cut Off Level 10 ng/mL) Final  . POC Oxazepam (BZO) 07/01/2020 Positive* NONE DETECTED (Cut Off Level 300 ng/mL) Final  . POC Cocaine UR 07/01/2020 None Detected  NONE DETECTED (Cut Off Level 300 ng/mL) Final  . POC Methamphetamine UR 07/01/2020 None Detected  NONE DETECTED (Cut Off Level 1000 ng/mL) Final  . POC Morphine 07/01/2020 None Detected  NONE DETECTED (Cut Off Level 300 ng/mL) Final  . POC Oxycodone UR 07/01/2020 None Detected  NONE DETECTED (Cut Off Level 100 ng/mL) Final  . POC Methadone UR 07/01/2020 None Detected  NONE DETECTED (Cut Off Level 300 ng/mL) Final  . POC Marijuana UR 07/01/2020 None Detected  NONE DETECTED (Cut Off Level 50 ng/mL) Final  . SARS Coronavirus 2 Ag 07/01/2020 Negative  Negative Preliminary  Admission on 06/10/2020, Discharged on 06/11/2020  Component Date Value Ref Range Status  . Sodium 06/10/2020 140  135 - 145 mmol/L Final  . Potassium 06/10/2020 3.7  3.5 - 5.1 mmol/L Final  . Chloride 06/10/2020 101  98 - 111 mmol/L Final  . CO2 06/10/2020 20* 22 - 32 mmol/L Final  . Glucose, Bld 06/10/2020 140* 70 - 99 mg/dL Final   Glucose reference range applies only to samples taken after fasting for at least 8 hours.  . BUN 06/10/2020 <5* 6 - 20 mg/dL Final  . Creatinine, Ser 06/10/2020 0.79  0.61 - 1.24 mg/dL Final  . Calcium 16/03/9603 9.0  8.9 - 10.3 mg/dL Final  . Total Protein 06/10/2020 6.6  6.5 - 8.1 g/dL Final  . Albumin 54/02/8118 4.0  3.5  - 5.0 g/dL Final  . AST 14/78/2956 97* 15 - 41 U/L Final  . ALT 06/10/2020 101* 0 - 44 U/L Final  . Alkaline Phosphatase 06/10/2020 65  38 - 126 U/L Final  . Total Bilirubin 06/10/2020 0.5  0.3 - 1.2 mg/dL Final  . GFR, Estimated 06/10/2020 >60  >60 mL/min Final   Comment: (NOTE) Calculated using the  CKD-EPI Creatinine Equation (2021)   . Anion gap 06/10/2020 19* 5 - 15 Final   Performed at [facility], [address] [place], [place] [address]  . Lipase 06/10/2020 18  11 - 51 U/L Final   Performed at [facility], [address], [place], [place] [address]  . WBC 06/11/2020 7.4  4.0 - 10.5 K/uL Final  . RBC 06/11/2020 3.80* 4.22 - 5.81 MIL/uL Final  . Hemoglobin 06/11/2020 13.2  13.0 - 17.0 g/dL Final  . HCT 86/57/8469 38.6* 39.0 - 52.0 % Final  . MCV 06/11/2020 101.6* 80.0 - 100.0 fL Final  . MCH 06/11/2020 34.7* 26.0 - 34.0 pg Final  . MCHC 06/11/2020 34.2  30.0 - 36.0 g/dL Final  . RDW 62/95/2841 12.0  11.5 - 15.5 % Final  . Platelets 06/11/2020 226  150 - 400 K/uL Final  . nRBC 06/11/2020 0.0  0.0 - 0.2 % Final  . Neutrophils Relative % 06/11/2020 47  % Final  . Neutro Abs 06/11/2020 3.5  1.7 - 7.7 K/uL Final  . Lymphocytes Relative 06/11/2020 31  % Final  . Lymphs Abs 06/11/2020 2.3  0.7 - 4.0 K/uL Final  . Monocytes Relative 06/11/2020 8  % Final  . Monocytes Absolute 06/11/2020 0.6  0.1 - 1.0 K/uL Final  . Eosinophils Relative 06/11/2020 10  % Final  . Eosinophils Absolute 06/11/2020 0.8* 0.0 - 0.5 K/uL Final  . Basophils Relative 06/11/2020 2  % Final  . Basophils Absolute 06/11/2020 0.2* 0.0 - 0.1 K/uL Final  . Immature Granulocytes 06/11/2020 2  % Final  . Abs Immature Granulocytes 06/11/2020 0.13* 0.00 - 0.07 K/uL Final   Performed at [facility] [address], [place], [place] [address]  Admission on 05/09/2020, Discharged on 05/12/2020  Component Date Value Ref Range Status  . WBC 05/09/2020 5.6  4.0 -  10.5 K/uL Final  . RBC 05/09/2020 4.23  4.22 - 5.81 MIL/uL Final  . Hemoglobin 05/09/2020 14.8  13.0 - 17.0 g/dL Final  . HCT 04/09/2535 43.6  39.0 - 52.0 % Final  . MCV 05/09/2020 103.1* 80.0 - 100.0 fL Final  . MCH 05/09/2020 35.0* 26.0 - 34.0 pg Final  . MCHC 05/09/2020 33.9  30.0 - 36.0 g/dL Final  . RDW 64/40/3474 12.4  11.5 - 15.5 % Final  . Platelets 05/09/2020 248  150 - 400 K/uL Final  . nRBC 05/09/2020 0.0  0.0 - 0.2 % Final  . Neutrophils Relative % 05/09/2020 46  % Final  . Neutro Abs 05/09/2020 2.6  1.7 - 7.7 K/uL Final  . Lymphocytes Relative 05/09/2020 35  % Final  . Lymphs Abs 05/09/2020 1.9  0.7 - 4.0 K/uL Final  . Monocytes Relative 05/09/2020 11  % Final  . Monocytes Absolute 05/09/2020 0.6  0.1 - 1.0 K/uL Final  . Eosinophils Relative 05/09/2020 5  % Final  . Eosinophils Absolute 05/09/2020 0.3  0.0 - 0.5 K/uL Final  . Basophils Relative 05/09/2020 2  % Final  . Basophils Absolute 05/09/2020 0.1  0.0 - 0.1 K/uL Final  . Immature Granulocytes 05/09/2020 1  % Final  . Abs Immature Granulocytes 05/09/2020 0.07  0.00 - 0.07 K/uL Final   Performed at [facility], [address], [place], [place] [address]  . Sodium 05/09/2020 141  135 - 145 mmol/L Final  . Potassium 05/09/2020 3.4* 3.5 - 5.1 mmol/L Final  . Chloride 05/09/2020 102  98 - 111 mmol/L Final  .  CO2 05/09/2020 26  22 - 32 mmol/L Final  . Glucose, Bld 05/09/2020 174* 70 - 99 mg/dL Final   Glucose reference range applies only to samples taken after fasting for at least 8 hours.  . BUN 05/09/2020 <5* 6 - 20 mg/dL Final  . Creatinine, Ser 05/09/2020 0.69  0.61 - 1.24 mg/dL Final  . Calcium 53/66/4403 9.1  8.9 - 10.3 mg/dL Final  . Total Protein 05/09/2020 7.3  6.5 - 8.1 g/dL Final  . Albumin 47/42/5956 4.5  3.5 - 5.0 g/dL Final  . AST 38/75/6433 81* 15 - 41 U/L Final  . ALT 05/09/2020 82* 0 - 44 U/L Final  . Alkaline Phosphatase 05/09/2020 61  38 - 126 U/L Final  . Total Bilirubin  05/09/2020 0.5  0.3 - 1.2 mg/dL Final  . GFR, Estimated 05/09/2020 >60  >60 mL/min Final   Comment: (NOTE) Calculated using the CKD-EPI Creatinine Equation (2021)   . Anion gap 05/09/2020 13  5 - 15 Final   Performed at [facility], [address], [place], [place] [address]  . Lipase 05/09/2020 58* 11 - 51 U/L Final   Performed at [facility], [address], [place], [place] [address]  . Color, Urine 05/09/2020 STRAW* YELLOW Final  . APPearance 05/09/2020 CLOUDY* CLEAR Final  . Specific Gravity, Urine 05/09/2020 1.004* 1.005 - 1.030 Final  . pH 05/09/2020 5.0  5.0 - 8.0 Final  . Glucose, UA 05/09/2020 NEGATIVE  NEGATIVE mg/dL Final  . Hgb urine dipstick 05/09/2020 NEGATIVE  NEGATIVE Final  . Bilirubin Urine 05/09/2020 NEGATIVE  NEGATIVE Final  . Ketones, ur 05/09/2020 NEGATIVE  NEGATIVE mg/dL Final  . Protein, ur 12/11/1599 NEGATIVE  NEGATIVE mg/dL Final  . Nitrite 09/32/3557 NEGATIVE  NEGATIVE Final  . Glori Luis 05/09/2020 NEGATIVE  NEGATIVE Final   Performed at [facility], [address] [place], [place] [address]  . Alcohol, Ethyl (B) 05/09/2020 170* <10 mg/dL Final   Comment: (NOTE) Lowest detectable limit for serum alcohol is 10 mg/dL.  For medical purposes only. Performed at [facility], [address], [place], [place] [address]   . SARS Coronavirus 2 by RT PCR 05/09/2020 NEGATIVE  NEGATIVE Final   Comment: (NOTE) SARS-CoV-2 target nucleic acids are NOT DETECTED.  The SARS-CoV-2 RNA is generally detectable in upper respiratory specimens during the acute phase of infection. The lowest concentration of SARS-CoV-2 viral copies this assay can detect is 138 copies/mL. A negative result does not preclude SARS-Cov-2 infection and should not be used as the sole basis for treatment or other patient management decisions. A negative result may occur with  improper specimen collection/handling,  submission of specimen other than nasopharyngeal swab, presence of viral mutation(s) within the areas targeted by this assay, and inadequate number of viral copies(<138 copies/mL). A negative result must be combined with clinical observations, patient history, and epidemiological information. The expected result is Negative.  Fact Sheet for Patients:  BloggerCourse.com  Fact Sheet for Healthcare Providers:  SeriousBroker.it  This test is no                          t yet approved or cleared by the [place] FDA and  has been authorized for detection and/or diagnosis of SARS-CoV-2 by FDA under an Emergency Use Authorization (EUA). This EUA will remain  in effect (meaning this test can be used) for the duration of the COVID-19 declaration under Section 564(b)(1)  of the Act, 21 U.S.C.section 360bbb-3(b)(1), unless the authorization is terminated  or revoked sooner.      . Influenza A by PCR 05/09/2020 NEGATIVE  NEGATIVE Final  . Influenza B by PCR 05/09/2020 NEGATIVE  NEGATIVE Final   Comment: (NOTE) The Xpert Xpress SARS-CoV-2/FLU/RSV plus assay is intended as an aid in the diagnosis of influenza from Nasopharyngeal swab specimens and should not be used as a sole basis for treatment. Nasal washings and aspirates are unacceptable for Xpert Xpress SARS-CoV-2/FLU/RSV testing.  Fact Sheet for Patients: BloggerCourse.com  Fact Sheet for Healthcare Providers: SeriousBroker.it  This test is not yet approved or cleared by the [place] FDA and has been authorized for detection and/or diagnosis of SARS-CoV-2 by FDA under an Emergency Use Authorization (EUA). This EUA will remain in effect (meaning this test can be used) for the duration of the COVID-19 declaration under Section 564(b)(1) of the Act, 21 U.S.C. section 360bbb-3(b)(1), unless the authorization is terminated  or revoked.  Performed at [facility], [address], [place], [place] [address]   . Total Protein 05/10/2020 6.4* 6.5 - 8.1 g/dL Final  . Albumin 40/98/1191 3.9  3.5 - 5.0 g/dL Final  . AST 47/82/9562 62* 15 - 41 U/L Final  . ALT 05/10/2020 67* 0 - 44 U/L Final  . Alkaline Phosphatase 05/10/2020 57  38 - 126 U/L Final  . Total Bilirubin 05/10/2020 0.8  0.3 - 1.2 mg/dL Final  . Bilirubin, Direct 05/10/2020 0.1  0.0 - 0.2 mg/dL Final  . Indirect Bilirubin 05/10/2020 0.7  0.3 - 0.9 mg/dL Final   Performed at [facility], [address] [place], [place] [address]  . Sodium 05/10/2020 139  135 - 145 mmol/L Final  . Potassium 05/10/2020 3.7  3.5 - 5.1 mmol/L Final  . Chloride 05/10/2020 100  98 - 111 mmol/L Final  . CO2 05/10/2020 29  22 - 32 mmol/L Final  . Glucose, Bld 05/10/2020 88  70 - 99 mg/dL Final   Glucose reference range applies only to samples taken after fasting for at least 8 hours.  . BUN 05/10/2020 <5* 6 - 20 mg/dL Final  . Creatinine, Ser 05/10/2020 0.78  0.61 - 1.24 mg/dL Final  . Calcium 57/84/6962 8.7* 8.9 - 10.3 mg/dL Final  . GFR, Estimated 05/10/2020 >60  >60 mL/min Final   Comment: (NOTE) Calculated using the CKD-EPI Creatinine Equation (2021)   . Anion gap 05/10/2020 10  5 - 15 Final   Performed at [facility], [address], [place], [place] [address]  . WBC 05/10/2020 6.3  4.0 - 10.5 K/uL Final  . RBC 05/10/2020 3.96* 4.22 - 5.81 MIL/uL Final  . Hemoglobin 05/10/2020 14.0  13.0 - 17.0 g/dL Final  . HCT 13/24/4010 40.9  39.0 - 52.0 % Final  . MCV 05/10/2020 103.3* 80.0 - 100.0 fL Final  . MCH 05/10/2020 35.4* 26.0 - 34.0 pg Final  . MCHC 05/10/2020 34.2  30.0 - 36.0 g/dL Final  . RDW 27/25/3664 12.3  11.5 - 15.5 % Final  . Platelets 05/10/2020 211  150 - 400 K/uL Final  . nRBC 05/10/2020 0.0  0.0 - 0.2 % Final   Performed at [facility], [address] [place], [place]  [address]  . Magnesium 05/09/2020 1.6* 1.7 - 2.4 mg/dL Final   Performed at [facility], [address] [place], [place] [address]  . Phosphorus 05/09/2020 3.6  2.5 - 4.6 mg/dL  Final   Performed at [facility], [address], [place], [place] [address]  . Magnesium 05/10/2020 1.8  1.7 - 2.4 mg/dL Final   Performed at [facility], [address], [place], [place] [address]  . Triglycerides 05/11/2020 1,048* <150 mg/dL Final   Comment: RESULTS CONFIRMED BY MANUAL DILUTION Performed at [facility], [address], [place], [place] [address]   . WBC 05/11/2020 4.0  4.0 - 10.5 K/uL Final  . RBC 05/11/2020 4.11* 4.22 - 5.81 MIL/uL Final  . Hemoglobin 05/11/2020 14.5  13.0 - 17.0 g/dL Final  . HCT 82/95/6213 42.7  39.0 - 52.0 % Final  . MCV 05/11/2020 103.9* 80.0 - 100.0 fL Final  . MCH 05/11/2020 35.3* 26.0 - 34.0 pg Final  . MCHC 05/11/2020 34.0  30.0 - 36.0 g/dL Final  . RDW 08/65/7846 12.1  11.5 - 15.5 % Final  . Platelets 05/11/2020 194  150 - 400 K/uL Final  . nRBC 05/11/2020 0.0  0.0 - 0.2 % Final  . Neutrophils Relative % 05/11/2020 46  % Final  . Neutro Abs 05/11/2020 1.9  1.7 - 7.7 K/uL Final  . Lymphocytes Relative 05/11/2020 33  % Final  . Lymphs Abs 05/11/2020 1.3  0.7 - 4.0 K/uL Final  . Monocytes Relative 05/11/2020 8  % Final  . Monocytes Absolute 05/11/2020 0.3  0.1 - 1.0 K/uL Final  . Eosinophils Relative 05/11/2020 10  % Final  . Eosinophils Absolute 05/11/2020 0.4  0.0 - 0.5 K/uL Final  . Basophils Relative 05/11/2020 2  % Final  . Basophils Absolute 05/11/2020 0.1  0.0 - 0.1 K/uL Final  . Immature Granulocytes 05/11/2020 1  % Final  . Abs Immature Granulocytes 05/11/2020 0.03  0.00 - 0.07 K/uL Final   Performed at [facility], [address] [place], [place] [address]  . Sodium 05/11/2020 137  135 - 145 mmol/L Final  . Potassium 05/11/2020 3.9  3.5 - 5.1 mmol/L Final  .  Chloride 05/11/2020 101  98 - 111 mmol/L Final  . CO2 05/11/2020 25  22 - 32 mmol/L Final  . Glucose, Bld 05/11/2020 112* 70 - 99 mg/dL Final   Glucose reference range applies only to samples taken after fasting for at least 8 hours.  . BUN 05/11/2020 6  6 - 20 mg/dL Final  . Creatinine, Ser 05/11/2020 0.79  0.61 - 1.24 mg/dL Final  . Calcium 28/41/3244 9.6  8.9 - 10.3 mg/dL Final  . Total Protein 05/11/2020 6.7  6.5 - 8.1 g/dL Final  . Albumin 06/15/7251 4.0  3.5 - 5.0 g/dL Final  . AST 66/44/0347 62* 15 - 41 U/L Final  . ALT 05/11/2020 59* 0 - 44 U/L Final  . Alkaline Phosphatase 05/11/2020 66  38 - 126 U/L Final  . Total Bilirubin 05/11/2020 1.0  0.3 - 1.2 mg/dL Final  . GFR, Estimated 05/11/2020 >60  >60 mL/min Final   Comment: (NOTE) Calculated using the CKD-EPI Creatinine Equation (2021)   . Anion gap 05/11/2020 11  5 - 15 Final   Performed at [facility], [address] [place], [place] [address]  . Magnesium 05/11/2020 1.9  1.7 - 2.4 mg/dL Final   Performed at [facility], [address] [place], [place] [address]  . Phosphorus 05/11/2020 3.9  2.5 - 4.6 mg/dL Final   Performed at [facility], [address] [place], [place] [address]  . WBC 05/12/2020  3.8* 4.0 - 10.5 K/uL Final  . RBC 05/12/2020 4.42  4.22 - 5.81 MIL/uL Final  . Hemoglobin 05/12/2020 15.3  13.0 - 17.0 g/dL Final  . HCT 16/03/9603 45.9  39.0 - 52.0 % Final  . MCV 05/12/2020 103.8* 80.0 - 100.0 fL Final  . MCH 05/12/2020 34.6* 26.0 - 34.0 pg Final  . MCHC 05/12/2020 33.3  30.0 - 36.0 g/dL Final  . RDW 54/02/8118 12.1  11.5 - 15.5 % Final  . Platelets 05/12/2020 183  150 - 400 K/uL Final   Comment: REPEATED TO VERIFY CONSISTENT WITH PREVIOUS RESULT   . nRBC 05/12/2020 0.0  0.0 - 0.2 % Final  . Neutrophils Relative % 05/12/2020 45  % Final  . Neutro Abs 05/12/2020 1.7  1.7 - 7.7 K/uL Final  . Lymphocytes Relative 05/12/2020 35  % Final  . Lymphs  Abs 05/12/2020 1.4  0.7 - 4.0 K/uL Final  . Monocytes Relative 05/12/2020 9  % Final  . Monocytes Absolute 05/12/2020 0.4  0.1 - 1.0 K/uL Final  . Eosinophils Relative 05/12/2020 8  % Final  . Eosinophils Absolute 05/12/2020 0.3  0.0 - 0.5 K/uL Final  . Basophils Relative 05/12/2020 2  % Final  . Basophils Absolute 05/12/2020 0.1  0.0 - 0.1 K/uL Final  . Immature Granulocytes 05/12/2020 1  % Final  . Abs Immature Granulocytes 05/12/2020 0.03  0.00 - 0.07 K/uL Final   Performed at [facility], [address] [place], [place] [address]  . Sodium 05/12/2020 136  135 - 145 mmol/L Final  . Potassium 05/12/2020 4.1  3.5 - 5.1 mmol/L Final  . Chloride 05/12/2020 102  98 - 111 mmol/L Final  . CO2 05/12/2020 25  22 - 32 mmol/L Final  . Glucose, Bld 05/12/2020 112* 70 - 99 mg/dL Final   Glucose reference range applies only to samples taken after fasting for at least 8 hours.  . BUN 05/12/2020 9  6 - 20 mg/dL Final  . Creatinine, Ser 05/12/2020 0.95  0.61 - 1.24 mg/dL Final  . Calcium 95/62/1308 10.0  8.9 - 10.3 mg/dL Final  . Total Protein 05/12/2020 7.2  6.5 - 8.1 g/dL Final  . Albumin 65/78/4696 4.3  3.5 - 5.0 g/dL Final  . AST 29/52/8413 84* 15 - 41 U/L Final  . ALT 05/12/2020 67* 0 - 44 U/L Final  . Alkaline Phosphatase 05/12/2020 69  38 - 126 U/L Final  . Total Bilirubin 05/12/2020 1.0  0.3 - 1.2 mg/dL Final  . GFR, Estimated 05/12/2020 >60  >60 mL/min Final   Comment: (NOTE) Calculated using the CKD-EPI Creatinine Equation (2021)   . Anion gap 05/12/2020 9  5 - 15 Final   Performed at [facility], [address], [place], [place] [address]  . Magnesium 05/12/2020 2.0  1.7 - 2.4 mg/dL Final   Performed at [facility], [address], [place], [place] [address]  . Phosphorus 05/12/2020 4.2  2.5 - 4.6 mg/dL Final   Performed at [facility] [place] [facility], [address] [place], [place] [address]  . Triglycerides  05/12/2020 530* <150 mg/dL Final   Comment: RESULTS CONFIRMED BY MANUAL DILUTION Performed at [facility], [address] [place], [place] [address]   Admission on 05/05/2020, Discharged on 05/06/2020  Component Date Value Ref Range Status  . Sodium 05/05/2020 140  135 - 145 mmol/L Final  . Potassium 05/05/2020 3.6  3.5 - 5.1 mmol/L Final  .  Chloride 05/05/2020 103  98 - 111 mmol/L Final  . CO2 05/05/2020 25  22 - 32 mmol/L Final  . Glucose, Bld 05/05/2020 111* 70 - 99 mg/dL Final   Glucose reference range applies only to samples taken after fasting for at least 8 hours.  . BUN 05/05/2020 <5* 6 - 20 mg/dL Final  . Creatinine, Ser 05/05/2020 0.81  0.61 - 1.24 mg/dL Final  . Calcium 16/10/960411/23/2021 9.8  8.9 - 10.3 mg/dL Final  . Total Protein 05/05/2020 7.0  6.5 - 8.1 g/dL Final  . Albumin 54/09/811911/23/2021 4.6  3.5 - 5.0 g/dL Final  . AST 14/78/295611/23/2021 100* 15 - 41 U/L Final  . ALT 05/05/2020 119* 0 - 44 U/L Final  . Alkaline Phosphatase 05/05/2020 68  38 - 126 U/L Final  . Total Bilirubin 05/05/2020 0.6  0.3 - 1.2 mg/dL Final  . GFR, Estimated 05/05/2020 >60  >60 mL/min Final   Comment: (NOTE) Calculated using the CKD-EPI Creatinine Equation (2021)   . Anion gap 05/05/2020 12  5 - 15 Final   Performed at [facility], [address] [place], [place] [address]  . Alcohol, Ethyl (B) 05/05/2020 169* <10 mg/dL Final   Comment: (NOTE) Lowest detectable limit for serum alcohol is 10 mg/dL.  For medical purposes only. Performed at [facility], [address] [place], [place] [address]   . Salicylate Lvl 05/05/2020 <7.0* 7.0 - 30.0 mg/dL Final   Performed at [facility], [address] [place], [place] [address]  . Acetaminophen (Tylenol), Serum 05/05/2020 <10* 10 - 30 ug/mL Final   Comment: (NOTE) Therapeutic concentrations vary significantly. A range of 10-30 ug/mL  may be an effective concentration for many patients. However, some  are best treated at  concentrations outside of this range. Acetaminophen concentrations >150 ug/mL at 4 hours after ingestion  and >50 ug/mL at 12 hours after ingestion are often associated with  toxic reactions.  Performed at [facility] [place] [facility], [address] [place], [place] [address]   . WBC 05/05/2020 5.6  4.0 - 10.5 K/uL Final  . RBC 05/05/2020 4.43  4.22 - 5.81 MIL/uL Final  . Hemoglobin 05/05/2020 15.4  13.0 - 17.0 g/dL Final  . HCT 32/44/010211/23/2021 45.7  39.0 - 52.0 % Final  . MCV 05/05/2020 103.2* 80.0 - 100.0 fL Final  . MCH 05/05/2020 34.8* 26.0 - 34.0 pg Final  . MCHC 05/05/2020 33.7  30.0 - 36.0 g/dL Final  . RDW 72/53/664411/23/2021 12.1  11.5 - 15.5 % Final  . Platelets 05/05/2020 270  150 - 400 K/uL Final  . nRBC 05/05/2020 0.0  0.0 - 0.2 % Final   Performed at [facility], [address] [place], [place] [address]  . Opiates 05/05/2020 NONE DETECTED  NONE DETECTED Final  . Cocaine 05/05/2020 NONE DETECTED  NONE DETECTED Final  . Benzodiazepines 05/05/2020 POSITIVE* NONE DETECTED Final  . Amphetamines 05/05/2020 NONE DETECTED  NONE DETECTED Final  . Tetrahydrocannabinol 05/05/2020 NONE DETECTED  NONE DETECTED Final  . Barbiturates 05/05/2020 NONE DETECTED  NONE DETECTED Final   Comment: (NOTE) DRUG SCREEN FOR MEDICAL PURPOSES ONLY.  IF CONFIRMATION IS NEEDED FOR ANY PURPOSE, NOTIFY LAB WITHIN 5 DAYS.  LOWEST DETECTABLE LIMITS FOR URINE DRUG SCREEN Drug Class                     Cutoff (ng/mL) Amphetamine and metabolites    1000 Barbiturate and metabolites  200 Benzodiazepine                 200 Tricyclics and metabolites     300 Opiates and metabolites        300 Cocaine and metabolites        300 THC                            50 Performed at [facility], [address] [place], [place] [address]   . Lipase 05/05/2020 27  11 - 51 U/L Final   Performed at [facility], [address], [place], [place] [address]  Admission on 05/03/2020, Discharged on 05/04/2020   Component Date Value Ref Range Status  . Sodium 05/03/2020 143  135 - 145 mmol/L Final  . Potassium 05/03/2020 3.6  3.5 - 5.1 mmol/L Final  . Chloride 05/03/2020 105  98 - 111 mmol/L Final  . CO2 05/03/2020 24  22 - 32 mmol/L Final  . Glucose, Bld 05/03/2020 121* 70 - 99 mg/dL Final   Glucose reference range applies only to samples taken after fasting for at least 8 hours.  . BUN 05/03/2020 <5* 6 - 20 mg/dL Final  . Creatinine, Ser 05/03/2020 0.81  0.61 - 1.24 mg/dL Final  . Calcium 09/81/1914 9.6  8.9 - 10.3 mg/dL Final  . Total Protein 05/03/2020 7.1  6.5 - 8.1 g/dL Final  . Albumin 78/29/5621 4.6  3.5 - 5.0 g/dL Final  . AST 30/86/5784 119* 15 - 41 U/L Final  . ALT 05/03/2020 121* 0 - 44 U/L Final  . Alkaline Phosphatase 05/03/2020 58  38 - 126 U/L Final  . Total Bilirubin 05/03/2020 0.4  0.3 - 1.2 mg/dL Final  . GFR, Estimated 05/03/2020 >60  >60 mL/min Final   Comment: (NOTE) Calculated using the CKD-EPI Creatinine Equation (2021)   . Anion gap 05/03/2020 14  5 - 15 Final   Performed at [facility], [address] [place], [place] [address]  . Alcohol, Ethyl (B) 05/03/2020 260* <10 mg/dL Final   Comment: (NOTE) Lowest detectable limit for serum alcohol is 10 mg/dL.  For medical purposes only. Performed at [facility], [address], [place], [place] [address]   . Salicylate Lvl 05/03/2020 <7.0* 7.0 - 30.0 mg/dL Final   Performed at [facility], [address], [place], [place] [address]  . Acetaminophen (Tylenol), Serum 05/03/2020 <10* 10 - 30 ug/mL Final   Comment: (NOTE) Therapeutic concentrations vary significantly. A range of 10-30 ug/mL  may be an effective concentration for many patients. However, some  are best treated at concentrations outside of this range. Acetaminophen concentrations >150 ug/mL at 4 hours after ingestion  and >50 ug/mL at 12 hours after ingestion are often associated with  toxic  reactions.  Performed at [facility], [address], [place], [place] [address]   . Opiates 05/03/2020 NONE DETECTED  NONE DETECTED Final  . Cocaine 05/03/2020 NONE DETECTED  NONE DETECTED Final  . Benzodiazepines 05/03/2020 POSITIVE* NONE DETECTED Final  . Amphetamines 05/03/2020 NONE DETECTED  NONE DETECTED Final  . Tetrahydrocannabinol 05/03/2020 NONE DETECTED  NONE DETECTED Final  . Barbiturates 05/03/2020 NONE DETECTED  NONE DETECTED Final   Comment: (NOTE) DRUG SCREEN FOR MEDICAL PURPOSES ONLY.  IF CONFIRMATION IS NEEDED FOR ANY PURPOSE, NOTIFY LAB WITHIN 5 DAYS.  LOWEST DETECTABLE LIMITS FOR URINE DRUG SCREEN Drug Class  Cutoff (ng/mL) Amphetamine and metabolites    1000 Barbiturate and metabolites    200 Benzodiazepine                 200 Tricyclics and metabolites     300 Opiates and metabolites        300 Cocaine and metabolites        300 THC                            50 Performed at [facility], [address], [place], [place] [address]   . WBC 05/03/2020 6.7  4.0 - 10.5 K/uL Final  . RBC 05/03/2020 4.24  4.22 - 5.81 MIL/uL Final  . Hemoglobin 05/03/2020 15.0  13.0 - 17.0 g/dL Final  . HCT 14/48/1856 44.0  39.0 - 52.0 % Final  . MCV 05/03/2020 103.8* 80.0 - 100.0 fL Final  . MCH 05/03/2020 35.4* 26.0 - 34.0 pg Final  . MCHC 05/03/2020 34.1  30.0 - 36.0 g/dL Final  . RDW 31/49/7026 12.4  11.5 - 15.5 % Final  . Platelets 05/03/2020 261  150 - 400 K/uL Final  . nRBC 05/03/2020 0.0  0.0 - 0.2 % Final  . Neutrophils Relative % 05/03/2020 35  % Final  . Neutro Abs 05/03/2020 2.4  1.7 - 7.7 K/uL Final  . Lymphocytes Relative 05/03/2020 50  % Final  . Lymphs Abs 05/03/2020 3.3  0.7 - 4.0 K/uL Final  . Monocytes Relative 05/03/2020 11  % Final  . Monocytes Absolute 05/03/2020 0.8  0.1 - 1.0 K/uL Final  . Eosinophils Relative 05/03/2020 2  % Final  . Eosinophils Absolute 05/03/2020 0.1  0.0 - 0.5 K/uL Final   . Basophils Relative 05/03/2020 2  % Final  . Basophils Absolute 05/03/2020 0.1  0.0 - 0.1 K/uL Final  . Immature Granulocytes 05/03/2020 0  % Final  . Abs Immature Granulocytes 05/03/2020 0.03  0.00 - 0.07 K/uL Final   Performed at [facility], [address] [place], [place] [address]  . Lipase 05/03/2020 28  11 - 51 U/L Final   Performed at [facility], [address], [place], [place] [address]  . SARS Coronavirus 2 by RT PCR 05/04/2020 NEGATIVE  NEGATIVE Final   Comment: (NOTE) SARS-CoV-2 target nucleic acids are NOT DETECTED.  The SARS-CoV-2 RNA is generally detectable in upper respiratory specimens during the acute phase of infection. The lowest concentration of SARS-CoV-2 viral copies this assay can detect is 138 copies/mL. A negative result does not preclude SARS-Cov-2 infection and should not be used as the sole basis for treatment or other patient management decisions. A negative result may occur with  improper specimen collection/handling, submission of specimen other than nasopharyngeal swab, presence of viral mutation(s) within the areas targeted by this assay, and inadequate number of viral copies(<138 copies/mL). A negative result must be combined with clinical observations, patient history, and epidemiological information. The expected result is Negative.  Fact Sheet for Patients:  BloggerCourse.com  Fact Sheet for Healthcare Providers:  SeriousBroker.it  This test is no                          t yet approved or cleared by the [place] FDA and  has been authorized for detection and/or diagnosis of SARS-CoV-2 by FDA under an Emergency Use Authorization (EUA). This EUA will remain  in effect (meaning  this test can be used) for the duration of the COVID-19 declaration under Section 564(b)(1) of the Act, 21 U.S.C.section 360bbb-3(b)(1), unless the authorization is  terminated  or revoked sooner.      . Influenza A by PCR 05/04/2020 NEGATIVE  NEGATIVE Final  . Influenza B by PCR 05/04/2020 NEGATIVE  NEGATIVE Final   Comment: (NOTE) The Xpert Xpress SARS-CoV-2/FLU/RSV plus assay is intended as an aid in the diagnosis of influenza from Nasopharyngeal swab specimens and should not be used as a sole basis for treatment. Nasal washings and aspirates are unacceptable for Xpert Xpress SARS-CoV-2/FLU/RSV testing.  Fact Sheet for Patients: BloggerCourse.com  Fact Sheet for Healthcare Providers: SeriousBroker.it  This test is not yet approved or cleared by the [place] FDA and has been authorized for detection and/or diagnosis of SARS-CoV-2 by FDA under an Emergency Use Authorization (EUA). This EUA will remain in effect (meaning this test can be used) for the duration of the COVID-19 declaration under Section 564(b)(1) of the Act, 21 U.S.C. section 360bbb-3(b)(1), unless the authorization is terminated or revoked.  Performed at [facility], [address], [place], [place] [address]   Admission on 04/21/2020, Discharged on 04/22/2020  Component Date Value Ref Range Status  . Lipase 04/21/2020 38  11 - 51 U/L Final   Performed at [facility], [address] [place], [place] [address]  . Sodium 04/21/2020 141  135 - 145 mmol/L Final  . Potassium 04/21/2020 4.0  3.5 - 5.1 mmol/L Final  . Chloride 04/21/2020 101  98 - 111 mmol/L Final  . CO2 04/21/2020 27  22 - 32 mmol/L Final  . Glucose, Bld 04/21/2020 111* 70 - 99 mg/dL Final   Glucose reference range applies only to samples taken after fasting for at least 8 hours.  . BUN 04/21/2020 <5* 6 - 20 mg/dL Final  . Creatinine, Ser 04/21/2020 0.82  0.61 - 1.24 mg/dL Final  . Calcium 81/19/1478 9.3  8.9 - 10.3 mg/dL Final  . Total Protein 04/21/2020 7.4  6.5 - 8.1 g/dL Final  . Albumin 29/56/2130 4.6  3.5 -  5.0 g/dL Final  . AST 86/57/8469 141* 15 - 41 U/L Final  . ALT 04/21/2020 118* 0 - 44 U/L Final  . Alkaline Phosphatase 04/21/2020 56  38 - 126 U/L Final  . Total Bilirubin 04/21/2020 0.6  0.3 - 1.2 mg/dL Final  . GFR, Estimated 04/21/2020 >60  >60 mL/min Final   Comment: (NOTE) Calculated using the CKD-EPI Creatinine Equation (2021)   . Anion gap 04/21/2020 13  5 - 15 Final   Performed at [facility], [address] [place], [place] [address]  . WBC 04/21/2020 4.9  4.0 - 10.5 K/uL Final  . RBC 04/21/2020 4.31  4.22 - 5.81 MIL/uL Final  . Hemoglobin 04/21/2020 15.2  13.0 - 17.0 g/dL Final  . HCT 84/13/2440 44.9  39.0 - 52.0 % Final  . MCV 04/21/2020 104.2* 80.0 - 100.0 fL Final  . MCH 04/21/2020 35.3* 26.0 - 34.0 pg Final  . MCHC 04/21/2020 33.9  30.0 - 36.0 g/dL Final  . RDW 04/09/2535 12.6  11.5 - 15.5 % Final  . Platelets 04/21/2020 218  150 - 400 K/uL Final  . nRBC 04/21/2020 0.0  0.0 - 0.2 % Final   Performed at [facility], [address] [place], [place] [address]  . Color, Urine 04/21/2020 YELLOW  YELLOW Final  . APPearance 04/21/2020 CLEAR  CLEAR Final  .  Specific Gravity, Urine 04/21/2020 1.011  1.005 - 1.030 Final  . pH 04/21/2020 5.0  5.0 - 8.0 Final  . Glucose, UA 04/21/2020 NEGATIVE  NEGATIVE mg/dL Final  . Hgb urine dipstick 04/21/2020 NEGATIVE  NEGATIVE Final  . Bilirubin Urine 04/21/2020 NEGATIVE  NEGATIVE Final  . Ketones, ur 04/21/2020 NEGATIVE  NEGATIVE mg/dL Final  . Protein, ur 49/44/9675 NEGATIVE  NEGATIVE mg/dL Final  . Nitrite 91/63/8466 NEGATIVE  NEGATIVE Final  . Glori Luis 04/21/2020 NEGATIVE  NEGATIVE Final   Performed at [facility], [address] [place], [place] [address]  . SARS Coronavirus 2 by RT PCR 04/21/2020 NEGATIVE  NEGATIVE Final   Comment: (NOTE) SARS-CoV-2 target nucleic acids are NOT DETECTED.  The SARS-CoV-2 RNA is generally detectable in upper respiratoy specimens  during the acute phase of infection. The lowest concentration of SARS-CoV-2 viral copies this assay can detect is 131 copies/mL. A negative result does not preclude SARS-Cov-2 infection and should not be used as the sole basis for treatment or other patient management decisions. A negative result may occur with  improper specimen collection/handling, submission of specimen other than nasopharyngeal swab, presence of viral mutation(s) within the areas targeted by this assay, and inadequate number of viral copies (<131 copies/mL). A negative result must be combined with clinical observations, patient history, and epidemiological information. The expected result is Negative.  Fact Sheet for Patients:  https://www.moore.com/  Fact Sheet for Healthcare Providers:  https://www.young.biz/  This test is no                          t yet approved or cleared by the [place] FDA and  has been authorized for detection and/or diagnosis of SARS-CoV-2 by FDA under an Emergency Use Authorization (EUA). This EUA will remain  in effect (meaning this test can be used) for the duration of the COVID-19 declaration under Section 564(b)(1) of the Act, 21 U.S.C. section 360bbb-3(b)(1), unless the authorization is terminated or revoked sooner.    . Influenza A by PCR 04/21/2020 NEGATIVE  NEGATIVE Final  . Influenza B by PCR 04/21/2020 NEGATIVE  NEGATIVE Final   Comment: (NOTE) The Xpert Xpress SARS-CoV-2/FLU/RSV assay is intended as an aid in  the diagnosis of influenza from Nasopharyngeal swab specimens and  should not be used as a sole basis for treatment. Nasal washings and  aspirates are unacceptable for Xpert Xpress SARS-CoV-2/FLU/RSV  testing.  Fact Sheet for Patients: https://www.moore.com/  Fact Sheet for Healthcare Providers: https://www.young.biz/  This test is not yet approved or cleared by the [place]  FDA and  has been authorized for detection and/or diagnosis of SARS-CoV-2 by  FDA under an Emergency Use Authorization (EUA). This EUA will remain  in effect (meaning this test can be used) for the duration of the  Covid-19 declaration under Section 564(b)(1) of the Act, 21  U.S.C. section 360bbb-3(b)(1), unless the authorization is  terminated or revoked. Performed at [facility], [address], [place], [place] [address]   . Alcohol, Ethyl (B) 04/21/2020 99* <10 mg/dL Final   Comment: (NOTE) Lowest detectable limit for serum alcohol is 10 mg/dL.  For medical purposes only. Performed at [facility], [address], [place], [place] [address]   . Opiates 04/21/2020 NONE DETECTED  NONE DETECTED Final  . Cocaine 04/21/2020 NONE DETECTED  NONE DETECTED Final  . Benzodiazepines 04/21/2020 POSITIVE* NONE DETECTED Final  . Amphetamines 04/21/2020 NONE DETECTED  NONE  DETECTED Final  . Tetrahydrocannabinol 04/21/2020 NONE DETECTED  NONE DETECTED Final  . Barbiturates 04/21/2020 NONE DETECTED  NONE DETECTED Final   Comment: (NOTE) DRUG SCREEN FOR MEDICAL PURPOSES ONLY.  IF CONFIRMATION IS NEEDED FOR ANY PURPOSE, NOTIFY LAB WITHIN 5 DAYS.  LOWEST DETECTABLE LIMITS FOR URINE DRUG SCREEN Drug Class                     Cutoff (ng/mL) Amphetamine and metabolites    1000 Barbiturate and metabolites    200 Benzodiazepine                 200 Tricyclics and metabolites     300 Opiates and metabolites        300 Cocaine and metabolites        300 THC                            50 Performed at [facility], [address], [place], [place] [address]   Admission on 02/19/2020, Discharged on 02/20/2020  Component Date Value Ref Range Status  . Sodium 02/19/2020 141  135 - 145 mmol/L Final  . Potassium 02/19/2020 3.7  3.5 - 5.1 mmol/L Final  . Chloride 02/19/2020 104  98 - 111 mmol/L Final  . CO2 02/19/2020 22  22 - 32 mmol/L Final   . Glucose, Bld 02/19/2020 108* 70 - 99 mg/dL Final   Glucose reference range applies only to samples taken after fasting for at least 8 hours.  . BUN 02/19/2020 <5* 6 - 20 mg/dL Final  . Creatinine, Ser 02/19/2020 0.93  0.61 - 1.24 mg/dL Final  . Calcium 13/01/6577 9.8  8.9 - 10.3 mg/dL Final  . Total Protein 02/19/2020 6.9  6.5 - 8.1 g/dL Final  . Albumin 46/96/2952 4.3  3.5 - 5.0 g/dL Final  . AST 84/13/2440 97* 15 - 41 U/L Final  . ALT 02/19/2020 84* 0 - 44 U/L Final  . Alkaline Phosphatase 02/19/2020 59  38 - 126 U/L Final  . Total Bilirubin 02/19/2020 0.5  0.3 - 1.2 mg/dL Final  . GFR calc non Af Amer 02/19/2020 >60  >60 mL/min Final  . GFR calc Af Amer 02/19/2020 >60  >60 mL/min Final  . Anion gap 02/19/2020 15  5 - 15 Final   Performed at [facility], [address], [place], [place] [address]  . Alcohol, Ethyl (B) 02/19/2020 168* <10 mg/dL Final   Comment: (NOTE) Lowest detectable limit for serum alcohol is 10 mg/dL.  For medical purposes only. Performed at [facility], [address] [place], [place] [address]   . WBC 02/19/2020 5.4  4.0 - 10.5 K/uL Final  . RBC 02/19/2020 4.45  4.22 - 5.81 MIL/uL Final  . Hemoglobin 02/19/2020 15.4  13.0 - 17.0 g/dL Final  . HCT 40/34/7425 46.9  39.0 - 52.0 % Final  . MCV 02/19/2020 105.4* 80.0 - 100.0 fL Final  . MCH 02/19/2020 34.6* 26.0 - 34.0 pg Final  . MCHC 02/19/2020 32.8  30.0 - 36.0 g/dL Final  . RDW 95/63/8756 12.8  11.5 - 15.5 % Final  . Platelets 02/19/2020 232  150 - 400 K/uL Final  . nRBC 02/19/2020 0.0  0.0 - 0.2 % Final   Performed at [facility], [address] [place], [place] [address]  . Opiates 02/19/2020 NONE DETECTED  NONE DETECTED Final  . Cocaine 02/19/2020 NONE  DETECTED  NONE DETECTED Final  . Benzodiazepines 02/19/2020 POSITIVE* NONE DETECTED Final  . Amphetamines 02/19/2020 NONE DETECTED  NONE DETECTED Final  . Tetrahydrocannabinol 02/19/2020 NONE DETECTED  NONE DETECTED Final  .  Barbiturates 02/19/2020 NONE DETECTED  NONE DETECTED Final   Comment: (NOTE) DRUG SCREEN FOR MEDICAL PURPOSES ONLY.  IF CONFIRMATION IS NEEDED FOR ANY PURPOSE, NOTIFY LAB WITHIN 5 DAYS.  LOWEST DETECTABLE LIMITS FOR URINE DRUG SCREEN Drug Class                     Cutoff (ng/mL) Amphetamine and metabolites    1000 Barbiturate and metabolites    200 Benzodiazepine                 200 Tricyclics and metabolites     300 Opiates and metabolites        300 Cocaine and metabolites        300 THC                            50 Performed at [facility], [address], [place], [place] [address]   . Lipase 02/19/2020 25  11 - 51 U/L Final   Performed at [facility], [address], [place], [place] [address]  Admission on 01/26/2020, Discharged on 01/26/2020  Component Date Value Ref Range Status  . Lipase 01/26/2020 20  11 - 51 U/L Final   Performed at [facility], [address], [place], [place] [address]  . Sodium 01/26/2020 143  135 - 145 mmol/L Final  . Potassium 01/26/2020 3.5  3.5 - 5.1 mmol/L Final  . Chloride 01/26/2020 106  98 - 111 mmol/L Final  . CO2 01/26/2020 24  22 - 32 mmol/L Final  . Glucose, Bld 01/26/2020 120* 70 - 99 mg/dL Final   Glucose reference range applies only to samples taken after fasting for at least 8 hours.  . BUN 01/26/2020 <5* 6 - 20 mg/dL Final  . Creatinine, Ser 01/26/2020 0.93  0.61 - 1.24 mg/dL Final  . Calcium 21/30/8657 9.5  8.9 - 10.3 mg/dL Final  . Total Protein 01/26/2020 7.2  6.5 - 8.1 g/dL Final  . Albumin 84/69/6295 4.3  3.5 - 5.0 g/dL Final  . AST 28/41/3244 55* 15 - 41 U/L Final  . ALT 01/26/2020 71* 0 - 44 U/L Final  . Alkaline Phosphatase 01/26/2020 50  38 - 126 U/L Final  . Total Bilirubin 01/26/2020 0.6  0.3 - 1.2 mg/dL Final  . GFR calc non Af Amer 01/26/2020 >60  >60 mL/min Final  . GFR calc Af Amer 01/26/2020 >60  >60 mL/min Final  . Anion gap 01/26/2020 13  5 - 15 Final   Performed at [facility], [address] [place], [place] [address]  . WBC 01/26/2020 6.6  4.0 - 10.5 K/uL Final  . RBC 01/26/2020 4.28  4.22 - 5.81 MIL/uL Final  . Hemoglobin 01/26/2020 14.7  13.0 - 17.0 g/dL Final  . HCT 25/36/6440 44.0  39.0 - 52.0 % Final  . MCV 01/26/2020 102.8* 80.0 - 100.0 fL Final  . MCH 01/26/2020 34.3* 26.0 - 34.0 pg Final  . MCHC 01/26/2020 33.4  30.0 - 36.0 g/dL Final  . RDW 34/74/2595 12.4  11.5 - 15.5 % Final  . Platelets 01/26/2020 271  150 - 400 K/uL Final  . nRBC 01/26/2020 0.0  0.0 - 0.2 %  Final   Performed at [facility], [address], [place], [place] [address]  . Color, Urine 01/26/2020 YELLOW  YELLOW Final  . APPearance 01/26/2020 CLEAR  CLEAR Final  . Specific Gravity, Urine 01/26/2020 1.014  1.005 - 1.030 Final  . pH 01/26/2020 8.0  5.0 - 8.0 Final  . Glucose, UA 01/26/2020 NEGATIVE  NEGATIVE mg/dL Final  . Hgb urine dipstick 01/26/2020 NEGATIVE  NEGATIVE Final  . Bilirubin Urine 01/26/2020 NEGATIVE  NEGATIVE Final  . Ketones, ur 01/26/2020 NEGATIVE  NEGATIVE mg/dL Final  . Protein, ur 60/45/4098 NEGATIVE  NEGATIVE mg/dL Final  . Nitrite 11/91/4782 NEGATIVE  NEGATIVE Final  . Glori Luis 01/26/2020 NEGATIVE  NEGATIVE Final   Performed at [facility], [address], [place], [place] [address]    Allergies: Caffeine and Bean pod extract  PTA Medications: (Not in a hospital admission)   Medical Decision Making  Admission orders placed  UDS positive for benzodiazepines, otherwise negative.   Continue home medications  Initiate CIWA protocol -lorazepam 1 mg every 6 hours prn for CIWA >10 -thiamine 100 mg daily for nutritional supplementation -hydroxyzine 25 mg every 6 hours prn for anxiety, CIWA < or = 10 -ondansetron 4 mg ODT every 6 hours prn nausea/vomiting -loperamide 2-4 mg capsule prn diarrhea or loose stools    Recommendations  Based on my evaluation the patient does not appear to have an emergency medical condition.  Jackelyn Poling, NP 07/02/20  12:49 AM

## 2020-07-06 ENCOUNTER — Emergency Department (HOSPITAL_COMMUNITY)
Admission: EM | Admit: 2020-07-06 | Discharge: 2020-07-07 | Disposition: A | Discharge status: Home / Self Care | Payer: 59 | Attending: Emergency Medicine | Admitting: Emergency Medicine

## 2020-07-06 ENCOUNTER — Encounter (HOSPITAL_COMMUNITY): Payer: Self-pay | Admitting: Emergency Medicine

## 2020-07-06 ENCOUNTER — Other Ambulatory Visit: Payer: Self-pay

## 2020-07-06 ENCOUNTER — Other Ambulatory Visit: Payer: Self-pay | Admitting: *Deleted

## 2020-07-06 DIAGNOSIS — I10 Essential (primary) hypertension: Secondary | ICD-10-CM | POA: Insufficient documentation

## 2020-07-06 DIAGNOSIS — Z20822 Contact with and (suspected) exposure to covid-19: Secondary | ICD-10-CM | POA: Insufficient documentation

## 2020-07-06 DIAGNOSIS — R45851 Suicidal ideations: Secondary | ICD-10-CM | POA: Insufficient documentation

## 2020-07-06 DIAGNOSIS — F17210 Nicotine dependence, cigarettes, uncomplicated: Secondary | ICD-10-CM | POA: Insufficient documentation

## 2020-07-06 DIAGNOSIS — F10129 Alcohol abuse with intoxication, unspecified: Secondary | ICD-10-CM | POA: Diagnosis not present

## 2020-07-06 DIAGNOSIS — Z79899 Other long term (current) drug therapy: Secondary | ICD-10-CM | POA: Insufficient documentation

## 2020-07-06 DIAGNOSIS — F10920 Alcohol use, unspecified with intoxication, uncomplicated: Secondary | ICD-10-CM

## 2020-07-06 DIAGNOSIS — F419 Anxiety disorder, unspecified: Secondary | ICD-10-CM | POA: Insufficient documentation

## 2020-07-06 LAB — RAPID URINE DRUG SCREEN, HOSP PERFORMED
Amphetamines: NOT DETECTED
Barbiturates: NOT DETECTED
Benzodiazepines: POSITIVE — AB
Cocaine: NOT DETECTED
Opiates: NOT DETECTED
Tetrahydrocannabinol: NOT DETECTED

## 2020-07-06 LAB — COMPREHENSIVE METABOLIC PANEL
ALT: 73 U/L — ABNORMAL HIGH (ref 0–44)
AST: 83 U/L — ABNORMAL HIGH (ref 15–41)
Albumin: 4.6 g/dL (ref 3.5–5.0)
Alkaline Phosphatase: 72 U/L (ref 38–126)
Anion gap: 15 (ref 5–15)
BUN: <5 mg/dL — ABNORMAL LOW (ref 6–20)
CO2: 24 mmol/L (ref 22–32)
Calcium: 9.2 mg/dL (ref 8.9–10.3)
Chloride: 100 mmol/L (ref 98–111)
Creatinine, Ser: 0.8 mg/dL (ref 0.61–1.24)
GFR, Estimated: >60 mL/min (ref >60)
Glucose, Bld: 182 mg/dL — ABNORMAL HIGH (ref 70–99)
Potassium: 3.5 mmol/L (ref 3.5–5.1)
Sodium: 139 mmol/L (ref 135–145)
Total Bilirubin: 0.7 mg/dL (ref 0.3–1.2)
Total Protein: 7.7 g/dL (ref 6.5–8.1)

## 2020-07-06 LAB — CBC WITH DIFFERENTIAL/PLATELET
Abs Immature Granulocytes: 0.09 10*3/uL — ABNORMAL HIGH (ref 0.00–0.07)
Basophils Absolute: 0.1 10*3/uL (ref 0.0–0.1)
Basophils Relative: 2 %
Eosinophils Absolute: 0.2 10*3/uL (ref 0.0–0.5)
Eosinophils Relative: 4 %
HCT: 45.4 % (ref 39.0–52.0)
Hemoglobin: 15.4 g/dL (ref 13.0–17.0)
Immature Granulocytes: 2 %
Lymphocytes Relative: 40 %
Lymphs Abs: 2.2 10*3/uL (ref 0.7–4.0)
MCH: 34.8 pg — ABNORMAL HIGH (ref 26.0–34.0)
MCHC: 33.9 g/dL (ref 30.0–36.0)
MCV: 102.5 fL — ABNORMAL HIGH (ref 80.0–100.0)
Monocytes Absolute: 0.4 10*3/uL (ref 0.1–1.0)
Monocytes Relative: 8 %
Neutro Abs: 2.3 10*3/uL (ref 1.7–7.7)
Neutrophils Relative %: 44 %
Platelets: 332 10*3/uL (ref 150–400)
RBC: 4.43 MIL/uL (ref 4.22–5.81)
RDW: 13.4 % (ref 11.5–15.5)
WBC: 5.4 10*3/uL (ref 4.0–10.5)
nRBC: 0 % (ref 0.0–0.2)

## 2020-07-06 LAB — ETHANOL: Alcohol, Ethyl (B): 204 mg/dL — ABNORMAL HIGH (ref <10)

## 2020-07-06 LAB — ACETAMINOPHEN LEVEL: Acetaminophen (Tylenol), Serum: <10 ug/mL — ABNORMAL LOW (ref 10–30)

## 2020-07-06 LAB — SALICYLATE LEVEL: Salicylate Lvl: <7 mg/dL — ABNORMAL LOW (ref 7.0–30.0)

## 2020-07-06 LAB — RESP PANEL BY RT-PCR (FLU A&B, COVID) ARPGX2
Influenza A by PCR: NEGATIVE
Influenza B by PCR: NEGATIVE
SARS Coronavirus 2 by RT PCR: NEGATIVE

## 2020-07-06 LAB — LIPASE, BLOOD: Lipase: 21 U/L (ref 11–51)

## 2020-07-06 MED ORDER — ONDANSETRON HCL 4 MG PO TABS
4.0000 mg | ORAL_TABLET | Freq: Once | ORAL | Status: AC
  Administered 2020-07-06: 4 mg via ORAL
  Filled 2020-07-06: qty 1

## 2020-07-06 MED ORDER — LORAZEPAM 1 MG PO TABS
1.0000 mg | ORAL_TABLET | Freq: Once | ORAL | Status: AC
  Administered 2020-07-06: 1 mg via ORAL
  Filled 2020-07-06: qty 1

## 2020-07-06 MED ORDER — ATENOLOL 25 MG PO TABS
25.0000 mg | ORAL_TABLET | Freq: Every day | ORAL | Status: DC
  Filled 2020-07-06: qty 1

## 2020-07-06 MED ORDER — ONDANSETRON 4 MG PO TBDP
4.0000 mg | ORAL_TABLET | Freq: Once | ORAL | Status: AC
  Administered 2020-07-06: 4 mg via ORAL
  Filled 2020-07-06: qty 1

## 2020-07-06 MED ORDER — NICOTINE 21 MG/24HR TD PT24
21.0000 mg | MEDICATED_PATCH | Freq: Every day | TRANSDERMAL | Status: DC
  Administered 2020-07-06: 21 mg via TRANSDERMAL
  Filled 2020-07-06: qty 1

## 2020-07-06 MED ORDER — THIAMINE HCL 100 MG PO TABS
100.0000 mg | ORAL_TABLET | Freq: Every day | ORAL | Status: DC
  Administered 2020-07-06: 100 mg via ORAL
  Filled 2020-07-06: qty 1

## 2020-07-06 MED ORDER — LORAZEPAM 1 MG PO TABS
0.0000 mg | ORAL_TABLET | Freq: Four times a day (QID) | ORAL | Status: DC
  Administered 2020-07-06 – 2020-07-07 (×2): 2 mg via ORAL
  Administered 2020-07-07: 1 mg via ORAL
  Filled 2020-07-06 (×2): qty 2
  Filled 2020-07-06: qty 1

## 2020-07-06 MED ORDER — LORAZEPAM 2 MG/ML IJ SOLN: 2.0000 mg | Freq: Once | INTRAMUSCULAR | Status: DC

## 2020-07-06 MED ORDER — LORAZEPAM 1 MG PO TABS: 0.0000 mg | ORAL_TABLET | Freq: Two times a day (BID) | ORAL | Status: DC

## 2020-07-06 MED ORDER — LORAZEPAM 2 MG/ML IJ SOLN: 0.0000 mg | Freq: Two times a day (BID) | INTRAMUSCULAR | Status: DC

## 2020-07-06 MED ORDER — LORAZEPAM 2 MG/ML IJ SOLN: 0.0000 mg | Freq: Four times a day (QID) | INTRAMUSCULAR | Status: DC

## 2020-07-06 MED ORDER — PANTOPRAZOLE SODIUM 40 MG PO TBEC
40.0000 mg | DELAYED_RELEASE_TABLET | Freq: Every day | ORAL | Status: DC
  Administered 2020-07-06: 40 mg via ORAL
  Filled 2020-07-06: qty 1

## 2020-07-06 MED ORDER — THIAMINE HCL 100 MG/ML IJ SOLN: 100.0000 mg | Freq: Every day | INTRAMUSCULAR | Status: DC

## 2020-07-06 MED ORDER — FENOFIBRATE 160 MG PO TABS
160.0000 mg | ORAL_TABLET | Freq: Every day | ORAL | Status: DC
  Filled 2020-07-06: qty 1

## 2020-07-06 MED ORDER — LORAZEPAM 2 MG/ML IJ SOLN
1.0000 mg | Freq: Once | INTRAMUSCULAR | Status: AC
  Administered 2020-07-06: 1 mg via INTRAVENOUS
  Filled 2020-07-06: qty 1

## 2020-07-06 NOTE — BH Assessment (Signed)
Comprehensive Clinical Assessment (CCA) Note  07/06/2020 Nicolas [name] [id number]   Nicolas [name] is a 55 year old male presenting voluntarily to [facility] due to requesting alcohol detox and SI. However during TTS assessment patient denied SI, HI and psychosis. Patient denied access to guns. Patient stated, "I have been drinking for 2 weeks and don't know what to do" Patient reported drinking 20 cans of beer daily. Patient reported worsening depressive systems. Patient was seen at [facility] on 07/01/20 and was recommended for continual observation at [facility]. Patient reported SI with plan to use gun with ED Provider and EMS, however patient denied SI and SI with gun with TTS clinician. Patient reported waking up every hour at night to drink a beer. Patient reported poor appetite.   Patient denied prior psych inpatient treatment, suicide attempts and self-harming behaviors. Patient denied receiving any outpatient mental health treatment. Patient denied being prescribed any psych medications. Patient was cooperative during assessments. Patient contracted for safety. Patient denied any collateral information for needed additional information. Patient reports support system included AA, church sponsors and friends.   BAL 204  Disposition Nira Conn, NP, patient does not meet inpatient criteria. TTS clinician faxed resources for alcohol detox facilities. Patient will follow up with outpatient resources.   Chief Complaint:  Chief Complaint  Patient presents with  . Suicidal  . Alcohol Intoxication   Visit Diagnosis: Alcohol dependence  CCA Biopsychosocial Intake/Chief Complaint:  EMS patient called because he drank 3 hours ago and felt shaky and wanted help detoxing  Current Symptoms/Problems: anxiety  Patient Reported Schizophrenia/Schizoaffective Diagnosis in Past: No  Strengths: 3 weeks sober (almost 30 days) after tx and AA  Preferences: medicated detox  Abilities: No data recorded  Type of  Services Patient Feels are Needed: medication to assist with detox  Initial Clinical Notes/Concerns: anxious  Mental Health Symptoms Depression:  Increase/decrease in appetite   Duration of Depressive symptoms: Less than two weeks   Mania:  N/A   Anxiety:   Difficulty concentrating; Irritability; Restlessness; Tension; Worrying; Sleep   Psychosis:  None   Duration of Psychotic symptoms: No data recorded  Trauma:  N/A   Obsessions:  N/A   Compulsions:  N/A   Inattention:  N/A   Hyperactivity/Impulsivity:  N/A   Oppositional/Defiant Behaviors:  N/A   Emotional Irregularity:  N/A   Other Mood/Personality Symptoms:  No data recorded   Mental Status Exam Appearance and self-care  Stature:  Average   Weight:  Average weight   Clothing:  Disheveled; Dirty   Grooming:  Neglected   Cosmetic use:  No data recorded  Posture/gait:  Tense   Motor activity:  Restless   Sensorium  Attention:  Distractible   Concentration:  Anxiety interferes   Orientation:  X5   Recall/memory:  Normal   Affect and Mood  Affect:  Anxious   Mood:  Anxious   Relating  Eye contact:  Normal   Facial expression:  Tense; Anxious   Attitude toward examiner:  Cooperative   Thought and Language  Speech flow: Clear and Coherent   Thought content:  Appropriate to Mood and Circumstances   Preoccupation:  Somatic   Hallucinations:  None   Organization:  No data recorded  [employer] of Knowledge:  Average   Intelligence:  Average   Abstraction:  Normal   Judgement:  Fair   Dance movement psychotherapist:  Realistic   Insight:  Gaps   Decision Making:  Normal   Social Functioning  Social Maturity:  Isolates; Responsible (isolates when drinking)   Social Judgement:  Normal   Stress  Stressors:  Other (Comment) (relapse)   Coping Ability:  Resilient   Skill Deficits:  None   Supports:  Family; Friends/Service system; Other (Comment) (AA)     Religion:    Leisure/Recreation: Leisure / Recreation Do You Have Hobbies?: Yes 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 withdrawl from alcohol preventing uninterrupted sleep  CCA Employment/Education Employment/Work Situation: Employment / Work Environmental consultant job has been impacted by current illness: Yes Describe how patient's job has been impacted: Pt is a self-employed Multimedia programmer:   CCA Family/Childhood History Family and Relationship History: Family history Marital status: Single Does patient have children?: No  Childhood History:  Childhood History Does patient have siblings?: Yes Did patient suffer any verbal/emotional/physical/sexual abuse as a child?: No Did patient suffer from severe childhood neglect?: No Has patient ever been sexually abused/assaulted/raped as an adolescent or adult?: No  Child/Adolescent Assessment:   CCA Substance Use Alcohol/Drug Use: Alcohol / Drug Use Pain Medications: See MAR Prescriptions: See MAR Over the Counter: See MAR History of alcohol / drug use?: Yes Longest period of sobriety (when/how long): has been drinking at least 20 beers daily x 8 days after 3 weeks of sobriety Negative Consequences of Use: Financial,Personal relationships,Work / School Withdrawal Symptoms: Fever / Chills,Nausea / Vomiting,Agitation,Patient aware of relationship between substance abuse and physical/medical complications,Sweats Substance #1 Name of Substance 1: alcohol- beer 1 - Amount (size/oz): at least 20 1 - Frequency: daily 1 - Duration: 8 days 1 - Last Use / Amount: 4-5 hours ago  ASAM's:  Six Dimensions of Multidimensional Assessment  Dimension 1:  Acute  Intoxication and/or Withdrawal Potential:   Dimension 1:  Description of individual's past and current experiences of substance use and withdrawal: gets very anxious and uncomfortable  Dimension 2:  Biomedical Conditions and Complications:   Dimension 2:  Description of patient's biomedical conditions and  complications: somatic  Dimension 3:  Emotional, Behavioral, or Cognitive Conditions and Complications:  Dimension 3:  Description of emotional, behavioral, or cognitive conditions and complications: has friends/supports in AA  Dimension 4:  Readiness to Change:  Dimension 4:  Description of Readiness to Change criteria: requesting detox/tx. disappointed he relapsed  Dimension 5:  Relapse, Continued use, or Continued Problem Potential:  Dimension 5:  Relapse, continued use, or continued problem potential critiera description: no significant MH impairment  Dimension 6:  Recovery/Living Environment:  Dimension 6:  Recovery/Iiving environment criteria description: supportive family, friends, AA  ASAM Severity Score: ASAM's Severity Rating Score: 5  ASAM Recommended Level of Treatment: ASAM Recommended Level of Treatment: Level I Outpatient Treatment   Substance use Disorder (SUD) Substance Use Disorder (SUD)  Checklist Symptoms of Substance Use: Continued use despite persistent or recurrent social, interpersonal problems, caused or exacerbated by use,Evidence of tolerance,Evidence of withdrawal (Comment),Persistent desire or unsuccessful efforts to cut down or control use,Recurrent use that results in a failure to fulfill major role obligations (work, school, home),Social, occupational, recreational activities given up or reduced due to use,Substance(s) often taken in larger amounts or over longer times than was intended  Recommendations for Services/Supports/Treatments: Recommendations for Services/Supports/Treatments Recommendations For Services/Supports/Treatments: Detox,CD-IOP Intensive Chemical  Dependency Program  DSM5 Diagnoses: Patient Active Problem List   Diagnosis Date Noted  . Pancreatitis 05/10/2020  . Acute alcoholic pancreatitis 05/09/2020  . GERD (gastroesophageal reflux disease) 11/03/2019  . Alcoholic hepatitis 11/02/2019  . Major depressive disorder, recurrent episode, severe (HCC) 10/21/2019  . 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 Acute pancreatitis 10/20/2019  . Alcohol use disorder, severe, dependence (HCC) 10/16/2019   Patient Centered Plan: Patient is on the following Treatment Plan(s):    Referrals to Alternative Service(s): Referred to Alternative Service(s):   Place:   Date:   Time:    Referred to Alternative Service(s):   Place:   Date:   Time:    Referred to Alternative Service(s):   Place:   Date:   Time:    Referred to Alternative Service(s):   Place:   Date:   Time:     Burnetta Sabin, [facility]

## 2020-07-06 NOTE — ED Notes (Signed)
Pt belonging placed in pt belonging bag and place under cabinet in triage.

## 2020-07-06 NOTE — ED Provider Notes (Signed)
[facility]-EMERGENCY DEPT Provider Note   CSN: [id number] Arrival date & time: 07/06/20  1619     History Chief Complaint  Patient presents with  . Suicidal  . Alcohol Intoxication    Nicolas [name] is a 55 y.o. adult history of alcohol abuse, hypertension, pancreatitis, GERD.  Patient arrives today via EMS for intoxication and suicidal ideations.  Patient reports that he has been drinking alcohol constantly for the past 2 weeks, he reports that he has had increased stress and thoughts of killing himself.  He plans to shoot himself with a gun.  He denies any injuries or toxic ingestions.  Denies homicidal ideations or visual/auditory hallucinations.  He reports that he is very anxious but has not drank in several hours.  Denies injury, fever/chills, chest pain, difficulty breathing, abdominal pain, vomiting, diarrhea or any additional concerns.  HPI     Past Medical History:  Diagnosis Date  . Alcohol abuse   . Hypertension   . Pancreatitis     Patient Active Problem List   Diagnosis Date Noted  . Pancreatitis 05/10/2020  . Acute alcoholic pancreatitis 05/09/2020  . GERD (gastroesophageal reflux disease) 11/03/2019  . Alcoholic hepatitis 11/02/2019  . Major depressive disorder, recurrent episode, severe (HCC) 10/21/2019  . 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 Acute pancreatitis 10/20/2019  . Alcohol use disorder, severe, dependence (HCC) 10/16/2019    Past Surgical History:  Procedure Laterality Date  . NASAL SEPTUM SURGERY         History reviewed. No pertinent family history.  Social History   Tobacco Use  . Smoking status: Current Every Day Smoker    Packs/day: 0.25    Types: Cigarettes  . Smokeless tobacco: Never Used  Vaping Use  . Vaping Use: Never used  Substance Use Topics  . Alcohol use: Yes    Alcohol/week: 42.0 standard drinks    Types: 42 Cans of beer per week    Comment: daily use  . Drug use: Not Currently    Types:  Cocaine    Comment: stopped 01/2019    Home Medications Prior to Admission medications   Medication Sig Start Date End Date Taking? Authorizing Provider  atenolol (TENORMIN) 25 MG tablet Take 1 tablet (25 mg total) by mouth daily. 06/17/20   Money, Gerlene Burdock, FNP  fenofibrate 160 MG tablet Take 1 tablet (160 mg total) by mouth daily. 06/17/20   Money, Gerlene Burdock, FNP  hydrOXYzine (VISTARIL) 25 MG capsule Take 25 mg by mouth in the morning and at bedtime.    [provider]  omeprazole (PRILOSEC) 20 MG capsule Take 1 capsule (20 mg total) by mouth daily. 06/17/20   Money, Gerlene Burdock, FNP  sertraline (ZOLOFT) 25 MG tablet Take 2 tablets (50 mg total) by mouth daily. 05/12/20 06/11/20  Zigmund Daniel., MD  sucralfate (CARAFATE) 1 g tablet Take 1 tablet (1 g total) by mouth 4 (four) times daily -  with meals and at bedtime for 14 days. 06/17/20 07/01/20  Money, Gerlene Burdock, FNP    Allergies    Caffeine and Bean pod extract  Review of Systems   Review of Systems Ten systems are reviewed and are negative for acute change except as noted in the HPI  Physical Exam Updated Vital Signs BP 131/89 (BP Location: Right Arm)   Pulse 88   Temp 98.4 F (36.9 C) (Oral)   Resp 15   Ht 5\' 10"  (1.778 m)   Wt 90.7 kg   SpO2  99%   BMI 28.70 kg/m   Physical Exam Constitutional:      General: Nicolas [name] is not in acute distress.    Appearance: Normal appearance. Nicolas [name] is well-developed. Nicolas [name] is not ill-appearing or diaphoretic.  HENT:     Head: Normocephalic and atraumatic.  Eyes:     General: Vision grossly intact. Gaze aligned appropriately.     Pupils: Pupils are equal, round, and reactive to light.  Neck:     Trachea: Trachea and phonation normal.  Pulmonary:     Effort: Pulmonary effort is normal. No respiratory distress.  Abdominal:     General: There is no distension.     Palpations: Abdomen is soft.     Tenderness: There is no abdominal tenderness. There is no  guarding or rebound.  Musculoskeletal:        General: Normal range of motion.     Cervical back: Normal range of motion.  Skin:    General: Skin is warm and dry.  Neurological:     Mental Status: Nicolas [name] is alert.     GCS: GCS eye subscore is 4. GCS verbal subscore is 5. GCS motor subscore is 6.     Comments: Speech is clear and goal oriented, follows commands Major Cranial nerves without deficit, no facial droop Moves extremities without ataxia, coordination intact  steady gait  Psychiatric:        Attention and Perception: Nicolas [name] does not perceive auditory or visual hallucinations.        Mood and Affect: Mood is anxious. Affect is labile.        Speech: Speech is rapid and pressured.        Behavior: Behavior is agitated.        Thought Content: Thought content includes suicidal ideation. Thought content does not include homicidal ideation. Thought content includes suicidal plan.     Comments: Pacing around room during evaluation     ED Results / Procedures / Treatments   Labs (all labs ordered are listed, but only abnormal results are displayed) Labs Reviewed  COMPREHENSIVE METABOLIC PANEL - Abnormal; Notable for the following components:      Result Value   Glucose, Bld 182 (*)    BUN <5 (*)    AST 83 (*)    ALT 73 (*)    All other components within normal limits  ETHANOL - Abnormal; Notable for the following components:   Alcohol, Ethyl (B) 204 (*)    All other components within normal limits  CBC WITH DIFFERENTIAL/PLATELET - Abnormal; Notable for the following components:   MCV 102.5 (*)    MCH 34.8 (*)    Abs Immature Granulocytes 0.09 (*)    All other components within normal limits  SALICYLATE LEVEL - Abnormal; Notable for the following components:   Salicylate Lvl <7.0 (*)    All other components within normal limits  ACETAMINOPHEN LEVEL - Abnormal; Notable for the following components:   Acetaminophen (Tylenol), Serum <10 (*)    All other  components within normal limits  RESP PANEL BY RT-PCR (FLU A&B, COVID) ARPGX2  LIPASE, BLOOD  RAPID URINE DRUG SCREEN, HOSP PERFORMED    EKG None  Radiology No results found.  Procedures Procedures   Medications Ordered in ED Medications  LORazepam (ATIVAN) injection 0-4 mg (has no administration in time range)    Or  LORazepam (ATIVAN) tablet 0-4 mg (has no administration in time range)  LORazepam (ATIVAN) injection 0-4 mg (has no  administration in time range)    Or  LORazepam (ATIVAN) tablet 0-4 mg (has no administration in time range)  thiamine tablet 100 mg (has no administration in time range)    Or  thiamine (B-1) injection 100 mg (has no administration in time range)  nicotine (NICODERM CQ - dosed in mg/24 hours) patch 21 mg (has no administration in time range)  LORazepam (ATIVAN) injection 1 mg (1 mg Intravenous Given 07/06/20 1746)  ondansetron (ZOFRAN) tablet 4 mg (4 mg Oral Given 07/06/20 1745)    ED Course  I have reviewed the triage vital signs and the nursing notes.  Pertinent labs & imaging results that were available during my care of the patient were reviewed by me and considered in my medical decision making (see chart for details).    MDM Rules/Calculators/A&P                         Additional history obtained from: 1. Nursing notes from this visit. 2. Review of electronic medical records. ---------------------------- 55 year old person arrived for alcohol intoxication and suicidal ideations with plan.  Patient reports drinking alcohol daily for the past 2 weeks.  Reports that they are agitated, not drinking last few hours.  No physical complaints.  Denies any injuries or toxic ingestions.  No vomiting fevers or additional concerns.  Medical clearance labs were ordered.  Ativan ordered for patient's agitation.  Patient was threatening to leave, IVC paperwork filed due to patient's intoxication and suicidal ideation with plan. ------------------- I  ordered, reviewed and interpreted labs which include: CBC without leukocytosis to suggest infection, no anemia, platelets within normal limits. CMP shows no emergent electrolyte derangement, AKI or gap.  Mild elevation of AST and ALT are improved from prior. Lipase within normal limits doubt acute pancreatitis as patient without vomiting. Covid/influenza panel negative Ethanol elevated 204 consistent with patient's history. Tylenol and salicylate levels are negative, no evidence of ingestion of the substances UDS in process. - Patient more calm after receiving Ativan.  Vital signs within normal limits on reassessment.  No acute distress.  No complaints at this time.  TTS has been ordered.  Patient remains under IVC.  CIWA in place.  At this time there does not appear to be any evidence of an acute emergency medical condition and the patient appears stable for psychiatric evaluation. Discussed with Dr. Audley Hose who agrees with plan.  Note: Portions of this report may have been transcribed using voice recognition software. Every effort was made to ensure accuracy; however, inadvertent computerized transcription errors may still be present. Final Clinical Impression(s) / ED Diagnoses Final diagnoses:  Alcoholic intoxication without complication [facility])  Suicidal ideation    Rx / DC Orders ED Discharge Orders    None       Elizabeth Palau 07/06/20 1951    Cheryll Cockayne, MD 07/06/20 [phone]

## 2020-07-06 NOTE — ED Triage Notes (Signed)
Per EMS- [facility] called EMS for assistance. Patient told EMS that he was going to shoot himself, but no gun found. EMS observed empty bottles of alcohol throughout the house. Patient states he has been drinking for 16 days. Patient lives alone.

## 2020-07-07 NOTE — BH Assessment (Signed)
[facility] Assessment Progress Note   Per Nira Conn, NP, this pt does not require psychiatric hospitalization at this time.  Pt presents under IVC initiated by EDP Norman Clay, MD which has been rescinded by EDP Virgina Norfolk, DO.  Pt is psychiatrically cleared.  Discharge instructions include referral information for area substance abuse treatment providers.  Dr Lockie Mola and pt's nurse, Colin Mulders, have been notified.  Doylene Canning, MA Triage Specialist [phone]

## 2020-07-07 NOTE — Patient Outreach (Signed)
[facility]) Care Management  07/07/2020  Nicolas [name] 12/03/1965 [id number]   CSW was able to make contact with pt briefly yesterday. Pt reported he was "talking to [employer]".  Pt did acknowledge his recent relapse(s) and ED and inpatient etoh rehab admission.  '"[facility] won't approve me to stay long enough".   CSW offered support as well as encouragement to pt prior to him returning to the other call.  Pt encouraged to call CSW back.   CSW now noting pt has admitted to ED.  CSW plans to discuss case further with [facility] team.  [name], MSW, LCSW Clinical Social Worker  [facility] [employer] [facility] [phone]

## 2020-07-07 NOTE — Discharge Instructions (Signed)
To help you maintain a sober lifestyle, a substance abuse treatment program may be beneficial to you.  Contact one of the following facilities at your earliest opportunity to ask about enrolling:  RESIDENTIAL PROGRAMS:       [facility]      [address]      [place], [place] [address]      [phone]       [facility] [place]      [address] [place] [address]      [phone]       [facility]      [address]      [place], [place] [address]      [phone]  CHEMICAL DEPENDENCY INTENSIVE OUTPATIENT PROGRAMS:       [facility] [place]      [address]      [place], [place] [address]      [phone]       [facility]      [address]      [place], [place] [address]      [phone]  

## 2020-07-07 NOTE — BH Assessment (Signed)
Nira Conn, NP, patient does not meet inpatient criteria. TTS clinician faxed resources for alcohol detox facilities. Patient will follow up with outpatient resources.

## 2020-07-07 NOTE — ED Provider Notes (Signed)
Psych cleared and discharged   Virgina Norfolk, DO 07/07/20 7425

## 2020-07-08 ENCOUNTER — Encounter (HOSPITAL_COMMUNITY): Payer: Self-pay | Admitting: *Deleted

## 2020-07-08 ENCOUNTER — Emergency Department (HOSPITAL_COMMUNITY)
Admission: EM | Admit: 2020-07-08 | Discharge: 2020-07-09 | Disposition: A | Discharge status: Home / Self Care | Payer: 59 | Attending: Emergency Medicine | Admitting: Emergency Medicine

## 2020-07-08 ENCOUNTER — Other Ambulatory Visit: Payer: Self-pay

## 2020-07-08 DIAGNOSIS — Z79899 Other long term (current) drug therapy: Secondary | ICD-10-CM | POA: Diagnosis not present

## 2020-07-08 DIAGNOSIS — F10220 Alcohol dependence with intoxication, uncomplicated: Secondary | ICD-10-CM

## 2020-07-08 DIAGNOSIS — F1020 Alcohol dependence, uncomplicated: Secondary | ICD-10-CM | POA: Diagnosis not present

## 2020-07-08 DIAGNOSIS — R45851 Suicidal ideations: Secondary | ICD-10-CM | POA: Diagnosis not present

## 2020-07-08 DIAGNOSIS — Z20822 Contact with and (suspected) exposure to covid-19: Secondary | ICD-10-CM | POA: Diagnosis not present

## 2020-07-08 DIAGNOSIS — I10 Essential (primary) hypertension: Secondary | ICD-10-CM | POA: Insufficient documentation

## 2020-07-08 DIAGNOSIS — F17210 Nicotine dependence, cigarettes, uncomplicated: Secondary | ICD-10-CM | POA: Insufficient documentation

## 2020-07-08 LAB — CBC
HCT: 43.4 % (ref 39.0–52.0)
Hemoglobin: 14.9 g/dL (ref 13.0–17.0)
MCH: 34.4 pg — ABNORMAL HIGH (ref 26.0–34.0)
MCHC: 34.3 g/dL (ref 30.0–36.0)
MCV: 100.2 fL — ABNORMAL HIGH (ref 80.0–100.0)
Platelets: 241 10*3/uL (ref 150–400)
RBC: 4.33 MIL/uL (ref 4.22–5.81)
RDW: 13.1 % (ref 11.5–15.5)
WBC: 4.9 10*3/uL (ref 4.0–10.5)
nRBC: 0 % (ref 0.0–0.2)

## 2020-07-08 LAB — RAPID URINE DRUG SCREEN, HOSP PERFORMED
Amphetamines: NOT DETECTED
Barbiturates: NOT DETECTED
Benzodiazepines: POSITIVE — AB
Cocaine: NOT DETECTED
Opiates: NOT DETECTED
Tetrahydrocannabinol: NOT DETECTED

## 2020-07-08 LAB — COMPREHENSIVE METABOLIC PANEL
ALT: 60 U/L — ABNORMAL HIGH (ref 0–44)
AST: 73 U/L — ABNORMAL HIGH (ref 15–41)
Albumin: 4.6 g/dL (ref 3.5–5.0)
Alkaline Phosphatase: 77 U/L (ref 38–126)
Anion gap: 14 (ref 5–15)
BUN: <5 mg/dL — ABNORMAL LOW (ref 6–20)
CO2: 24 mmol/L (ref 22–32)
Calcium: 9.4 mg/dL (ref 8.9–10.3)
Chloride: 94 mmol/L — ABNORMAL LOW (ref 98–111)
Creatinine, Ser: 0.72 mg/dL (ref 0.61–1.24)
GFR, Estimated: >60 mL/min (ref >60)
Glucose, Bld: 132 mg/dL — ABNORMAL HIGH (ref 70–99)
Potassium: 3.5 mmol/L (ref 3.5–5.1)
Sodium: 132 mmol/L — ABNORMAL LOW (ref 135–145)
Total Bilirubin: 0.9 mg/dL (ref 0.3–1.2)
Total Protein: 7.7 g/dL (ref 6.5–8.1)

## 2020-07-08 LAB — ETHANOL: Alcohol, Ethyl (B): 173 mg/dL — ABNORMAL HIGH (ref <10)

## 2020-07-08 LAB — SARS CORONAVIRUS 2 BY RT PCR (HOSPITAL ORDER, PERFORMED IN ~~LOC~~ HOSPITAL LAB): SARS Coronavirus 2: NEGATIVE

## 2020-07-08 MED ORDER — ALUM & MAG HYDROXIDE-SIMETH 200-200-20 MG/5ML PO SUSP: 30.0000 mL | Freq: Four times a day (QID) | ORAL | Status: DC | PRN

## 2020-07-08 MED ORDER — NICOTINE 21 MG/24HR TD PT24
21.0000 mg | MEDICATED_PATCH | Freq: Every day | TRANSDERMAL | Status: DC
  Administered 2020-07-08 – 2020-07-09 (×2): 21 mg via TRANSDERMAL
  Filled 2020-07-08 (×2): qty 1

## 2020-07-08 MED ORDER — PANTOPRAZOLE SODIUM 40 MG PO TBEC
40.0000 mg | DELAYED_RELEASE_TABLET | Freq: Once | ORAL | Status: AC
  Administered 2020-07-08: 40 mg via ORAL
  Filled 2020-07-08: qty 1

## 2020-07-08 MED ORDER — LORAZEPAM 2 MG/ML IJ SOLN: 0.0000 mg | Freq: Two times a day (BID) | INTRAMUSCULAR | Status: DC

## 2020-07-08 MED ORDER — ONDANSETRON HCL 4 MG PO TABS
4.0000 mg | ORAL_TABLET | Freq: Three times a day (TID) | ORAL | Status: DC | PRN
  Administered 2020-07-08: 4 mg via ORAL
  Filled 2020-07-08: qty 1

## 2020-07-08 MED ORDER — SUCRALFATE 1 G PO TABS
1.0000 g | ORAL_TABLET | Freq: Three times a day (TID) | ORAL | Status: DC
  Administered 2020-07-08 – 2020-07-09 (×2): 1 g via ORAL
  Filled 2020-07-08 (×5): qty 1

## 2020-07-08 MED ORDER — THIAMINE HCL 100 MG/ML IJ SOLN: 100.0000 mg | Freq: Every day | INTRAMUSCULAR | Status: DC

## 2020-07-08 MED ORDER — LORAZEPAM 1 MG PO TABS: 0.0000 mg | ORAL_TABLET | Freq: Two times a day (BID) | ORAL | Status: DC

## 2020-07-08 MED ORDER — LORAZEPAM 2 MG/ML IJ SOLN: 0.0000 mg | Freq: Four times a day (QID) | INTRAMUSCULAR | Status: DC

## 2020-07-08 MED ORDER — SERTRALINE HCL 50 MG PO TABS: 50.0000 mg | ORAL_TABLET | Freq: Every day | ORAL | Status: DC

## 2020-07-08 MED ORDER — LORAZEPAM 1 MG PO TABS
0.0000 mg | ORAL_TABLET | Freq: Four times a day (QID) | ORAL | Status: DC
  Administered 2020-07-08: 1 mg via ORAL
  Administered 2020-07-09: 2 mg via ORAL
  Administered 2020-07-09: 1 mg via ORAL
  Filled 2020-07-08 (×4): qty 2

## 2020-07-08 MED ORDER — LORAZEPAM 1 MG PO TABS
2.0000 mg | ORAL_TABLET | Freq: Once | ORAL | Status: AC
  Administered 2020-07-08: 2 mg via ORAL

## 2020-07-08 MED ORDER — THIAMINE HCL 100 MG PO TABS
100.0000 mg | ORAL_TABLET | Freq: Every day | ORAL | Status: DC
  Administered 2020-07-08 – 2020-07-09 (×2): 100 mg via ORAL
  Filled 2020-07-08 (×2): qty 1

## 2020-07-08 MED ORDER — ATENOLOL 25 MG PO TABS
25.0000 mg | ORAL_TABLET | Freq: Every day | ORAL | Status: DC
  Administered 2020-07-09: 25 mg via ORAL
  Filled 2020-07-08 (×3): qty 1

## 2020-07-08 MED ORDER — ACETAMINOPHEN 325 MG PO TABS
650.0000 mg | ORAL_TABLET | ORAL | Status: DC | PRN
  Administered 2020-07-08: 650 mg via ORAL
  Filled 2020-07-08: qty 2

## 2020-07-08 NOTE — ED Triage Notes (Signed)
Pt BIB [facility] with IVC papers. States he is going into withdrawal from ETOH Last drink.1.5 hours ago. [employer] the IVC papers state he has been drinking and wants to drink himself to death. Pt upset due to not being admitted when he comes in to be evaluated. His insurance will no longer cover rehab. [facility] attempted to take pt to [facility] who refused pt.

## 2020-07-09 DIAGNOSIS — F1020 Alcohol dependence, uncomplicated: Secondary | ICD-10-CM

## 2020-07-09 NOTE — BH Assessment (Signed)
Disposition Jason Berry, NP, recommends overnight observation for safety and stabilization with psych reassessment in the AM.  

## 2020-07-09 NOTE — ED Notes (Signed)
TTS machine at bedside waiting evaluation call to TTS to notify pt is ready to 4 different numbers at [facility] AND [facility] unsuccessful.

## 2020-07-09 NOTE — BH Assessment (Signed)
Comprehensive Clinical Assessment (CCA) Note  07/09/2020 Nicolas [name] [id number]   Nicolas [name] is a 55 year old male presenting under IVC to [facility] due to SI with plan to drink himself to death. Patient was seen by TTS on 07/06/20 and discharged to follow up with resources. Patient denied HI and psychosis. When asked what has changed since last TTS assessment. Patient stated, "I went back home and drank, I didn't stop, its not easy". Patient reported [employer], whom stated they couldn't find treatment and if they did very little was covered. 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   Per Triage Note Pt BIB [facility] with IVC papers. States he is going into withdrawal from ETOH Last drink.1.5 hours ago. [facility] the IVC papers state he has been drinking and wants to drink himself to death. Pt upset due to not being admitted when he comes in to be evaluated. His insurance will no longer cover rehab.   PER TTS Assessment 07/06/20 However during TTS assessment patient denied SI, HI and psychosis. Patient denied access to guns. Patient stated, "I have been drinking for 2 weeks and don't know what to do" Patient reported drinking 20 cans of beer daily. Patient reported worsening depressive systems. Patient was seen at [facility] on 07/01/20 and was recommended for continual observation at [facility]. Patient reported SI with plan to use gun with ED Provider and EMS, however patient denied SI and SI with gun with TTS clinician. Patient reported waking up every hour at night to drink a beer. Patient reported poor appetite.  Patient denied prior psych inpatient treatment, suicide attempts and self-harming behaviors. Patient denied receiving any outpatient mental health treatment. Patient denied being prescribed any psych medications. Patient was cooperative during  assessments. Patient contracted for safety. Patient denied any collateral information for needed additional information. Patient reports support system included AA, church sponsors and friends.   Disposition Nira Conn, NP, recommends overnight observation for safety and stabilization with psych reassessment in the AM.   Chief Complaint:  Chief Complaint  Patient presents with  . IVC   Visit Diagnosis: Alcohol dependence  CCA Biopsychosocial Intake/Chief Complaint:  EMS patient called because he drank 3 hours ago and felt shaky and wanted help detoxing  Current Symptoms/Problems: anxiety  Patient Reported Schizophrenia/Schizoaffective Diagnosis in Past: No  Strengths: 3 weeks sober (almost 30 days) after tx and AA  Preferences: medicated detox  Abilities: No data recorded  Type of Services Patient Feels are Needed: medication to assist with detox   Initial Clinical Notes/Concerns: anxious   Mental Health Symptoms Depression:  Increase/decrease in appetite   Duration of Depressive symptoms: Less than two weeks   Mania:  N/A   Anxiety:   Difficulty concentrating; Irritability; Restlessness; Tension; Worrying; Sleep   Psychosis:  None   Duration of Psychotic symptoms: No data recorded  Trauma:  N/A   Obsessions:  N/A   Compulsions:  N/A   Inattention:  N/A   Hyperactivity/Impulsivity:  N/A   Oppositional/Defiant Behaviors:  N/A   Emotional Irregularity:  N/A   Other Mood/Personality Symptoms:  No data recorded   Mental Status Exam Appearance and self-care  Stature:  Average   Weight:  Average weight   Clothing:  Disheveled; Dirty   Grooming:  Neglected   Cosmetic use:  No data recorded  Posture/gait:  Tense   Motor  activity:  Restless   Sensorium  Attention:  Distractible   Concentration:  Anxiety interferes   Orientation:  X5   Recall/memory:  Normal   Affect and Mood  Affect:  Anxious   Mood:  Anxious   Relating  Eye contact:   Normal   Facial expression:  Tense; Anxious   Attitude toward examiner:  Cooperative   Thought and Language  Speech flow: Clear and Coherent   Thought content:  Appropriate to Mood and Circumstances   Preoccupation:  Somatic   Hallucinations:  None   Organization:  No data recorded  [employer] of Knowledge:  Average   Intelligence:  Average   Abstraction:  Normal   Judgement:  Fair   Dance movement psychotherapist:  Realistic   Insight:  Gaps   Decision Making:  Normal   Social Functioning  Social Maturity:  Isolates; Responsible (isolates when drinking)   Social Judgement:  Normal   Stress  Stressors:  Other (Comment) (relapse)   Coping Ability:  Resilient   Skill Deficits:  None   Supports:  Family; Friends/Service system; Other (Comment) (AA)     Religion:   Leisure/Recreation: Leisure / Recreation Do You Have Hobbies?: Yes 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 withdrawl from alcohol preventing uninterrupted sleep  CCA Employment/Education Employment/Work Situation: Employment / Work Environmental consultant job has been impacted by current illness: Yes Describe how patient's job has been impacted: Pt is a self-employed Multimedia programmer:   CCA Family/Childhood History Family and Relationship History: Family history Marital status: Single Does patient have children?: No  Childhood History:  Childhood History Does patient have siblings?: Yes Did patient suffer any verbal/emotional/physical/sexual abuse as a child?: No Did patient suffer from severe childhood neglect?: No Has patient ever been sexually abused/assaulted/raped as an adolescent or adult?:  No  Child/Adolescent Assessment:   CCA Substance Use Alcohol/Drug Use: Alcohol / Drug Use Pain Medications: See MAR Prescriptions: See MAR Over the Counter: See MAR History of alcohol / drug use?: Yes Longest period of sobriety (when/how long): has been drinking at least 20 beers daily x 8 days after 3 weeks of sobriety Negative Consequences of Use: Financial,Personal relationships,Work / School Withdrawal Symptoms: Fever / Chills,Nausea / Vomiting,Agitation,Patient aware of relationship between substance abuse and physical/medical complications,Sweats Substance #1 Name of Substance 1: alcohol- beer 1 - Amount (size/oz): at least 20 1 - Frequency: daily 1 - Duration: 8 days 1 - Last Use / Amount: 4-5 hours ago   ASAM's:  Six Dimensions of Multidimensional Assessment  Dimension 1:  Acute Intoxication and/or Withdrawal Potential:   Dimension 1:  Description of individual's past and current experiences of substance use and withdrawal: gets very anxious and uncomfortable  Dimension 2:  Biomedical Conditions and Complications:   Dimension 2:  Description of patient's biomedical conditions and  complications: somatic  Dimension 3:  Emotional, Behavioral, or Cognitive Conditions and Complications:  Dimension 3:  Description of emotional, behavioral, or cognitive conditions and complications: has friends/supports in AA  Dimension 4:  Readiness to Change:  Dimension 4:  Description of Readiness to Change criteria: requesting detox/tx. disappointed he relapsed  Dimension 5:  Relapse, Continued use, or Continued Problem Potential:  Dimension 5:  Relapse, continued use,  or continued problem potential critiera description: no significant MH impairment  Dimension 6:  Recovery/Living Environment:  Dimension 6:  Recovery/Iiving environment criteria description: supportive family, friends, AA  ASAM Severity Score: ASAM's Severity Rating Score: 5  ASAM Recommended Level of Treatment: ASAM Recommended  Level of Treatment: Level I Outpatient Treatment   Substance use Disorder (SUD) Substance Use Disorder (SUD)  Checklist Symptoms of Substance Use: Continued use despite persistent or recurrent social, interpersonal problems, caused or exacerbated by use,Evidence of tolerance,Evidence of withdrawal (Comment),Persistent desire or unsuccessful efforts to cut down or control use,Recurrent use that results in a failure to fulfill major role obligations (work, school, home),Social, occupational, recreational activities given up or reduced due to use,Substance(s) often taken in larger amounts or over longer times than was intended  Recommendations for Services/Supports/Treatments: Recommendations for Services/Supports/Treatments Recommendations For Services/Supports/Treatments: Detox,CD-IOP Intensive Chemical Dependency Program  DSM5 Diagnoses: Patient Active Problem List   Diagnosis Date Noted  . Pancreatitis 05/10/2020  . Acute alcoholic pancreatitis 05/09/2020  . GERD (gastroesophageal reflux disease) 11/03/2019  . Alcoholic hepatitis 11/02/2019  . Major depressive disorder, recurrent episode, severe (HCC) 10/21/2019  . 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 Acute pancreatitis 10/20/2019  . Alcohol use disorder, severe, dependence (HCC) 10/16/2019   Patient Centered Plan: Patient is on the following Treatment Plan(s):    Referrals to Alternative Service(s): Referred to Alternative Service(s):   Place:   Date:   Time:    Referred to Alternative Service(s):   Place:   Date:   Time:    Referred to Alternative Service(s):   Place:   Date:   Time:    Referred to Alternative Service(s):   Place:   Date:   Time:     Burnetta Sabin, [facility]

## 2020-07-09 NOTE — ED Notes (Signed)
Dr Nicanor Alcon alerted to repeat CIWA score of 16 that was taken 1 hour after a previous CIWA of 16 ok to give another 2mg  po ativan at this time.

## 2020-07-09 NOTE — Discharge Instructions (Signed)
To help you maintain a sober lifestyle, a substance abuse treatment program may be beneficial to you.  Contact one of the following facilities at your earliest opportunity to ask about enrolling:  RESIDENTIAL PROGRAMS:       [facility]      [address]      [place], [place] [address]      [phone]       [facility] [place]      [address] [place] [address]      [phone]       [facility]      [address]      [place], [place] [address]      [phone]  CHEMICAL DEPENDENCY INTENSIVE OUTPATIENT PROGRAMS:       [facility]      [address]      [place], [place] [address]      [phone]       [facility]      [address]      [place], [place] [address]      [phone]  

## 2020-07-09 NOTE — BH Assessment (Addendum)
[facility] Assessment Progress Note   Per Berneice Heinrich, NP, this pt does not require psychiatric hospitalization at this time.  Pt presents under IVC initiated by Crissie Reese and upheld by EDP Meridee Score, MD which has been rescinded by EDP Lorre Nick, MD.  Pt is psychiatrically cleared.  Discharge instructions include referral information for area substance abuse treatment providers.  Pt would also benefit from seeing a Peer Support Specialist, and a peer support consult has been ordered for pt.  Because pt is at risk for alcohol withdrawal, it is recommended that peer support see pt at [facility] to attempt to find detox placement.  Dr Freida Busman  and pt's nurse, Waynetta Sandy, have been notified.  Doylene Canning, MA Triage Specialist [phone]  Addendum:  Per Inetta Fermo, pt would benefit from alcohol detoxification.  The following facilities have been contacted to seek placement for this pt, with results as noted:  Beds available, information sent, decision pending: [facility]  At capacity: [facility] (no detox beds available)  This pt is now voluntary. If he is accepted to a facility, please discuss disposition with pt to be sure that he agrees to the plan.  If a facility agrees to accept pt and the plan changes in any way please call the facility to inform them of the change.  Final disposition is pending as of this writing.   Doylene Canning, [place] Behavioral Health Coordinator [phone]

## 2020-07-09 NOTE — ED Provider Notes (Addendum)
Patient seen on rounds today.  He denies any SI or HI.  Will be seen by peers support and will be discharged  Patient medically cleared   Lorre Nick, MD 07/09/20 0900    Lorre Nick, MD 07/09/20 1119

## 2020-07-09 NOTE — BH Assessment (Addendum)
[facility] Assessment Progress Note  Per Berneice Heinrich, NP, pt would benefit from detoxification, but he does not require psychiatric hospitalization and he does not meet criteria for involuntary commitment.  IVC under which pt presented has been rescinded.  This Clinical research associate contacted several area facilities to seek placement.  [facility] agreed to admit pt, however, when this Clinical research associate spoke to pt he did not want to be transferred, preferring instead to be discharged.  Pt is psychiatrically cleared.  Inetta Fermo, EDP Lorre Nick, MD and pt's nurse, Waynetta Sandy, have been informed.  Discharge instructions include referral information for several area substance abuse treatment provider.   Doylene Canning, MA Triage Specialist [phone]  Addendum:  Rubie Maid at [facility] has been informed that pt does not need a bed after all.  Doylene Canning, [place] [facility] Coordinator [phone]

## 2020-07-09 NOTE — Consult Note (Signed)
Telepsych Consultation   Reason for Consult: Psychiatry provider reassessment Referring Physician: [facility] physician Location of Patient: [facility] Location of Provider: [facility] TTS Department  Patient Identification: Nicolas [name] MRN:  [id number] Principal Diagnosis: Alcohol use disorder, severe, dependence (HCC) Diagnosis:  Principal Problem:   Alcohol use disorder, severe, dependence (HCC)   Total Time spent with patient: 30 minutes  Subjective:   Nicolas [name] is a 55 y.o. adult patient.  Patient states "I need help to stop using alcohol."  Patient states "I went to [facility] in July 2021 and [facility] in November but they never keep me long enough."  Patient reports readiness to stop using alcohol.  Patient would like to be admitted for inpatient substance use treatment upon discharge from hospital.  Patient states "I have never tried to hurt myself or kill myself, when I am not drinking I am fine."  Patient denies suicidal ideations.  Patient denies any history of self-harm behaviors, denies any history of suicide attempts.  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  Patient reports financial concerns as well as medical insurance are his primary concern currently.  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  Patient resides in [place].  Patient denies access to weapons.  Patient is currently employed with limited income.  Patient endorses alcohol use, approximately 6 to 10, 24 ounce beers daily.  Patient denies substance use aside from alcohol.  Patient endorses average sleep and decreased appetite.  Patient denies current outpatient psychiatric care.  Per medical record patient has history of alcohol use disorder and major depressive disorder.  Patient states "I took Zoloft but I did not like it at all."  Patient  denies any current outpatient psychotropic medications.  Patient reports willingness to follow-up with outpatient psychiatry but reports that primary concern is alcohol use disorder. Patient reports psychiatric family history includes an uncle who also was diagnosed with alcohol use disorder.  Patient assessed by nurse practitioner.  Patient alert and oriented, answers appropriately.  Patient pleasant and cooperative during assessment.  Patient denies homicidal ideations.  Patient denies both auditory and visual hallucinations.  There is no evidence of delusional thought content and no indication that patient is responding to internal stimuli.  Patient denies symptoms of paranoia.  Patient offered support and encouragement.  Patient declines that I contact anyone for collateral information on his behalf.    Past Psychiatric History: Alcohol use disorder, major depressive disorder  Risk to Self:  Denies Risk to Others:  Denies Prior Inpatient Therapy:  None reported Prior Outpatient Therapy:  Patient reports he has been seen in substance use intensive outpatient in the past but does not believe this was helpful  Past Medical History:  Past Medical History:  Diagnosis Date  . Alcohol abuse   . Hypertension   . Pancreatitis     Past Surgical History:  Procedure Laterality Date  . NASAL SEPTUM SURGERY     Family History: No family history on file. Family Psychiatric  History: Maternal uncle-alcohol use disorder Social History:  Social History   Substance and Sexual Activity  Alcohol Use Yes  . Alcohol/week: 42.0 standard drinks  . Types: 42 Cans of beer per week   Comment: daily use     Social History   Substance and Sexual Activity  Drug Use Not Currently  . Types: Cocaine   Comment: stopped 01/2019  Social History   Socioeconomic History  . Marital status: Single    Spouse name: Not on file  . Number of children: Not on file  . Years of education: Not on file  .  Highest education level: Not on file  Occupational History  . Not on file  Tobacco Use  . Smoking status: Current Every Day Smoker    Packs/day: 0.25    Types: Cigarettes  . Smokeless tobacco: Never Used  Vaping Use  . Vaping Use: Never used  Substance and Sexual Activity  . Alcohol use: Yes    Alcohol/week: 42.0 standard drinks    Types: 42 Cans of beer per week    Comment: daily use  . Drug use: Not Currently    Types: Cocaine    Comment: stopped 01/2019  . Sexual activity: Not on file  Other Topics Concern  . Not on file  Social History Narrative  . Not on file   Social Determinants of Health   Financial Resource Strain: Not on file  Food Insecurity: Not on file  Transportation Needs: No Transportation Needs  . Lack of Transportation (Medical): No  . Lack of Transportation (Non-Medical): No  Physical Activity: Not on file  Stress: Not on file  Social Connections: Not on file   Additional Social History:    Allergies:   Allergies  Allergen Reactions  . Caffeine Palpitations, Anaphylaxis and Other (See Comments)    Other reaction(s): Heart rhythm/rate changes Heart beat super fast and bean enzymes    . Bean Pod Extract Palpitations    Labs:  Results for orders placed or performed during the hospital encounter of 07/08/20 (from the past 48 hour(s))  Comprehensive metabolic panel     Status: Abnormal   Collection Time: 07/08/20  6:30 PM  Result Value Ref Range   Sodium 132 (L) 135 - 145 mmol/L   Potassium 3.5 3.5 - 5.1 mmol/L   Chloride 94 (L) 98 - 111 mmol/L   CO2 24 22 - 32 mmol/L   Glucose, Bld 132 (H) 70 - 99 mg/dL    Comment: Glucose reference range applies only to samples taken after fasting for at least 8 hours.   BUN <5 (L) 6 - 20 mg/dL   Creatinine, Ser 6.50 0.61 - 1.24 mg/dL   Calcium 9.4 8.9 - 35.4 mg/dL   Total Protein 7.7 6.5 - 8.1 g/dL   Albumin 4.6 3.5 - 5.0 g/dL   AST 73 (H) 15 - 41 U/L   ALT 60 (H) 0 - 44 U/L   Alkaline Phosphatase 77  38 - 126 U/L   Total Bilirubin 0.9 0.3 - 1.2 mg/dL   GFR, Estimated >65 >68 mL/min    Comment: (NOTE) Calculated using the CKD-EPI Creatinine Equation (2021)    Anion gap 14 5 - 15    Comment: Performed at [facility], [address] [place], [place] [address]  Ethanol     Status: Abnormal   Collection Time: 07/08/20  6:30 PM  Result Value Ref Range   Alcohol, Ethyl (B) 173 (H) <10 mg/dL    Comment: (NOTE) Lowest detectable limit for serum alcohol is 10 mg/dL.  For medical purposes only. Performed at [facility], [address] [place], [place] [address]   cbc     Status: Abnormal   Collection Time: 07/08/20  6:30 PM  Result Value Ref Range   WBC 4.9 4.0 - 10.5 K/uL   RBC 4.33 4.22 - 5.81 MIL/uL  Hemoglobin 14.9 13.0 - 17.0 g/dL   HCT 16.143.4 09.639.0 - 04.552.0 %   MCV 100.2 (H) 80.0 - 100.0 fL   MCH 34.4 (H) 26.0 - 34.0 pg   MCHC 34.3 30.0 - 36.0 g/dL   RDW 40.913.1 81.111.5 - 91.415.5 %   Platelets 241 150 - 400 K/uL   nRBC 0.0 0.0 - 0.2 %    Comment: Performed at [facility], [address] [place], [place] [address]  Rapid urine drug screen (hospital performed)     Status: Abnormal   Collection Time: 07/08/20  6:30 PM  Result Value Ref Range   Opiates NONE DETECTED NONE DETECTED   Cocaine NONE DETECTED NONE DETECTED   Benzodiazepines POSITIVE (A) NONE DETECTED   Amphetamines NONE DETECTED NONE DETECTED   Tetrahydrocannabinol NONE DETECTED NONE DETECTED   Barbiturates NONE DETECTED NONE DETECTED    Comment: (NOTE) DRUG SCREEN FOR MEDICAL PURPOSES ONLY.  IF CONFIRMATION IS NEEDED FOR ANY PURPOSE, NOTIFY LAB WITHIN 5 DAYS.  LOWEST DETECTABLE LIMITS FOR URINE DRUG SCREEN Drug Class                     Cutoff (ng/mL) Amphetamine and metabolites    1000 Barbiturate and metabolites    200 Benzodiazepine                 200 Tricyclics and metabolites     300 Opiates and metabolites        300 Cocaine and metabolites         300 THC                            50 Performed at [facility], [address] [place], [place] [address]   SARS Coronavirus 2 by RT PCR (hospital order, performed in [facility] hospital lab) Nasopharyngeal Nasopharyngeal Swab     Status: None   Collection Time: 07/08/20  9:32 PM   Specimen: Nasopharyngeal Swab  Result Value Ref Range   SARS Coronavirus 2 NEGATIVE NEGATIVE    Comment: (NOTE) SARS-CoV-2 target nucleic acids are NOT DETECTED.  The SARS-CoV-2 RNA is generally detectable in upper and lower respiratory specimens during the acute phase of infection. The lowest concentration of SARS-CoV-2 viral copies this assay can detect is 250 copies / mL. A negative result does not preclude SARS-CoV-2 infection and should not be used as the sole basis for treatment or other patient management decisions.  A negative result may occur with improper specimen collection / handling, submission of specimen other than nasopharyngeal swab, presence of viral mutation(s) within the areas targeted by this assay, and inadequate number of viral copies (<250 copies / mL). A negative result must be combined with clinical observations, patient history, and epidemiological information.  Fact Sheet for Patients:   BoilerBrush.com.cyhttps://www.fda.gov/media/136312/download  Fact Sheet for Healthcare Providers: https://pope.com/https://www.fda.gov/media/136313/download  This test is not yet approved or  cleared by the [place] FDA and has been authorized for detection and/or diagnosis of SARS-CoV-2 by FDA under an Emergency Use Authorization (EUA).  This EUA will remain in effect (meaning this test can be used) for the duration of the COVID-19 declaration under Section 564(b)(1) of the Act, 21 U.S.C. section 360bbb-3(b)(1), unless the authorization is terminated or revoked sooner.  Performed at [facility], [address] [place], [place] [address]     Medications:  Current  Facility-Administered Medications  Medication Dose Route Frequency  Provider Last Rate Last Admin  . acetaminophen (TYLENOL) tablet 650 mg  650 mg Oral Q4H PRN Terrilee Files, MD   650 mg at 07/08/20 2122  . alum & mag hydroxide-simeth (MAALOX/MYLANTA) 200-200-20 MG/5ML suspension 30 mL  30 mL Oral Q6H PRN Terrilee Files, MD      . atenolol (TENORMIN) tablet 25 mg  25 mg Oral Daily Terrilee Files, MD      . LORazepam (ATIVAN) injection 0-4 mg  0-4 mg Intravenous Q6H Terrilee Files, MD       Or  . LORazepam (ATIVAN) tablet 0-4 mg  0-4 mg Oral Q6H Terrilee Files, MD   1 mg at 07/09/20 0050  . [START ON 07/11/2020] LORazepam (ATIVAN) injection 0-4 mg  0-4 mg Intravenous Q12H Terrilee Files, MD       Or  . Melene Muller ON 07/11/2020] LORazepam (ATIVAN) tablet 0-4 mg  0-4 mg Oral Q12H Terrilee Files, MD      . nicotine (NICODERM CQ - dosed in mg/24 hours) patch 21 mg  21 mg Transdermal Daily Terrilee Files, MD   21 mg at 07/08/20 2124  . ondansetron (ZOFRAN) tablet 4 mg  4 mg Oral Q8H PRN Terrilee Files, MD   4 mg at 07/08/20 2123  . sertraline (ZOLOFT) tablet 50 mg  50 mg Oral Daily Terrilee Files, MD      . sucralfate (CARAFATE) tablet 1 g  1 g Oral TID WC & HS Terrilee Files, MD   1 g at 07/08/20 2123  . thiamine tablet 100 mg  100 mg Oral Daily Terrilee Files, MD   100 mg at 07/08/20 1902   Or  . thiamine (B-1) injection 100 mg  100 mg Intravenous Daily Terrilee Files, MD       Current Outpatient Medications  Medication Sig Dispense Refill  . atenolol (TENORMIN) 25 MG tablet Take 1 tablet (25 mg total) by mouth daily. 30 tablet 0  . Calcium Carb-Cholecalciferol (CALCIUM 1000 + D) 1000-800 MG-UNIT TABS Take 1 tablet by mouth once a week.    . fenofibrate 160 MG tablet Take 1 tablet (160 mg total) by mouth daily. 30 tablet 0  . Multiple Vitamin (DAILY VITE) TABS Take 1 tablet by mouth once a week.    Marland Kitchen omeprazole (PRILOSEC) 20 MG capsule Take 1 capsule (20 mg  total) by mouth daily. 30 capsule 0  . sucralfate (CARAFATE) 1 g tablet Take 1 g by mouth 4 (four) times daily.    Marland Kitchen VITAMINS B1 B6 B12 PO Take 1 tablet by mouth once a week.    . sertraline (ZOLOFT) 25 MG tablet Take 2 tablets (50 mg total) by mouth daily. 60 tablet 0  . sertraline (ZOLOFT) 50 MG tablet Take 50 mg by mouth daily. (Patient not taking: No sig reported)      Musculoskeletal: Strength & Muscle Tone: within normal limits Gait & Station: normal Patient leans: N/A  Psychiatric Specialty Exam: Physical Exam Vitals and nursing note reviewed.  Constitutional:      Appearance: Nicolas [name] is well-developed.  HENT:     Head: Normocephalic.  Cardiovascular:     Rate and Rhythm: Normal rate.  Pulmonary:     Effort: Pulmonary effort is normal.  Neurological:     Mental Status: Nicolas [name] is alert and oriented to person, place, and time.  Psychiatric:        Attention and Perception: Attention and perception normal.  Mood and Affect: Mood and affect normal.        Speech: Speech normal.        Behavior: Behavior normal. Behavior is cooperative.        Thought Content: Thought content normal.        Cognition and Memory: Cognition and memory normal.        Judgment: Judgment normal.     Review of Systems  Constitutional: Negative.   HENT: Negative.   Eyes: Negative.   Respiratory: Negative.   Cardiovascular: Negative.   Gastrointestinal: Negative.   Genitourinary: Negative.   Musculoskeletal: Negative.   Skin: Negative.   Neurological: Negative.   Psychiatric/Behavioral: Negative.     Blood pressure (!) 137/98, pulse (!) 102, temperature 97.9 F (36.6 C), temperature source Oral, resp. rate 20, weight 90.7 kg, SpO2 97 %.Body mass index is 28.7 kg/m.  General Appearance: Casual and Fairly Groomed  Eye Contact:  Good  Speech:  Clear and Coherent and Normal Rate  Volume:  Normal  Mood:  Depressed  Affect:  Appropriate and Congruent  Thought Process:   Coherent, Goal Directed and Descriptions of Associations: Intact  Orientation:  Full (Time, Place, and Person)  Thought Content:  Logical  Suicidal Thoughts:  No  Homicidal Thoughts:  No  Memory:  Immediate;   Fair Recent;   Fair Remote;   Fair  Judgement:  Fair  Insight:  Good  Psychomotor Activity:  Normal  Concentration:  Concentration: Good and Attention Span: Good  Recall:  Good  Fund of Knowledge:  Good  Language:  Good  Akathisia:  No  Handed:  Right  AIMS (if indicated):     Assets:  Communication Skills Desire for Improvement Financial Resources/Insurance Housing Intimacy Leisure Time Physical Health Resilience Social Support Talents/Skills Transportation  ADL's:  Intact  Cognition:  WNL  Sleep:        Treatment Plan Summary: Plan Patient reviewed with Dr. Bronwen Betters.  Patient cleared by psychiatry. Peers support consult ordered. Follow-up with substance use treatment resources and outpatient psychiatry resources provided.  Disposition: No evidence of imminent risk to self or others at present.   Patient does not meet criteria for psychiatric inpatient admission. Supportive therapy provided about ongoing stressors. Discussed crisis plan, support from social network, calling 911, coming to the Emergency Department, and calling Suicide Hotline.  This service was provided via telemedicine using a 2-way, interactive audio and video technology.  Names of all persons participating in this telemedicine service and their role in this encounter. Name: [name] Role: Patient  Name: Berneice Heinrich Role: FNP  Name: Dr. Bronwen Betters Role: Psychiatrist    Patrcia Dolly, FNP 07/09/2020 8:16 AM

## 2020-07-09 NOTE — ED Notes (Signed)
Pt DC d off unit to home per provider. Pt alert, calm, cooperative, no ss/s of distress.  DC information given to and reviewed with pt,  Pt acknowledged understanding. Belongings given to pt. Pt ambulatory off unit, escorted by RN. Pt using [employer] [facility] for transportation.

## 2020-07-12 ENCOUNTER — Emergency Department (HOSPITAL_COMMUNITY)
Admission: EM | Admit: 2020-07-12 | Discharge: 2020-07-12 | Disposition: A | Discharge status: Home / Self Care | Payer: 59 | Attending: Emergency Medicine | Admitting: Emergency Medicine

## 2020-07-12 ENCOUNTER — Other Ambulatory Visit: Payer: Self-pay

## 2020-07-12 DIAGNOSIS — R45851 Suicidal ideations: Secondary | ICD-10-CM | POA: Diagnosis not present

## 2020-07-12 DIAGNOSIS — Y906 Blood alcohol level of 120-199 mg/100 ml: Secondary | ICD-10-CM | POA: Insufficient documentation

## 2020-07-12 DIAGNOSIS — I10 Essential (primary) hypertension: Secondary | ICD-10-CM | POA: Diagnosis not present

## 2020-07-12 DIAGNOSIS — F10129 Alcohol abuse with intoxication, unspecified: Secondary | ICD-10-CM | POA: Diagnosis not present

## 2020-07-12 DIAGNOSIS — F17210 Nicotine dependence, cigarettes, uncomplicated: Secondary | ICD-10-CM | POA: Insufficient documentation

## 2020-07-12 DIAGNOSIS — F10920 Alcohol use, unspecified with intoxication, uncomplicated: Secondary | ICD-10-CM

## 2020-07-12 LAB — CBC WITH DIFFERENTIAL/PLATELET
Abs Immature Granulocytes: 0.03 10*3/uL (ref 0.00–0.07)
Basophils Absolute: 0.1 10*3/uL (ref 0.0–0.1)
Basophils Relative: 2 %
Eosinophils Absolute: 0.2 10*3/uL (ref 0.0–0.5)
Eosinophils Relative: 4 %
HCT: 44 % (ref 39.0–52.0)
Hemoglobin: 15.7 g/dL (ref 13.0–17.0)
Immature Granulocytes: 1 %
Lymphocytes Relative: 58 %
Lymphs Abs: 3.1 10*3/uL (ref 0.7–4.0)
MCH: 36.8 pg — ABNORMAL HIGH (ref 26.0–34.0)
MCHC: 35.7 g/dL (ref 30.0–36.0)
MCV: 103 fL — ABNORMAL HIGH (ref 80.0–100.0)
Monocytes Absolute: 0.5 10*3/uL (ref 0.1–1.0)
Monocytes Relative: 10 %
Neutro Abs: 1.3 10*3/uL — ABNORMAL LOW (ref 1.7–7.7)
Neutrophils Relative %: 25 %
Platelets: 216 10*3/uL (ref 150–400)
RBC: 4.27 MIL/uL (ref 4.22–5.81)
RDW: 13.5 % (ref 11.5–15.5)
WBC: 5.3 10*3/uL (ref 4.0–10.5)
nRBC: 0 % (ref 0.0–0.2)

## 2020-07-12 LAB — RAPID URINE DRUG SCREEN, HOSP PERFORMED
Amphetamines: NOT DETECTED
Barbiturates: NOT DETECTED
Benzodiazepines: NOT DETECTED
Cocaine: NOT DETECTED
Opiates: NOT DETECTED
Tetrahydrocannabinol: NOT DETECTED

## 2020-07-12 LAB — COMPREHENSIVE METABOLIC PANEL
ALT: 48 U/L — ABNORMAL HIGH (ref 0–44)
AST: 94 U/L — ABNORMAL HIGH (ref 15–41)
Albumin: 4.3 g/dL (ref 3.5–5.0)
Alkaline Phosphatase: 85 U/L (ref 38–126)
Anion gap: 21 — ABNORMAL HIGH (ref 5–15)
BUN: <5 mg/dL — ABNORMAL LOW (ref 6–20)
CO2: 14 mmol/L — ABNORMAL LOW (ref 22–32)
Calcium: 9 mg/dL (ref 8.9–10.3)
Chloride: 104 mmol/L (ref 98–111)
Creatinine, Ser: 0.75 mg/dL (ref 0.61–1.24)
GFR, Estimated: >60 mL/min (ref >60)
Glucose, Bld: 147 mg/dL — ABNORMAL HIGH (ref 70–99)
Potassium: 4.9 mmol/L (ref 3.5–5.1)
Sodium: 139 mmol/L (ref 135–145)
Total Bilirubin: 1.5 mg/dL — ABNORMAL HIGH (ref 0.3–1.2)

## 2020-07-12 LAB — SALICYLATE LEVEL: Salicylate Lvl: <7 mg/dL — ABNORMAL LOW (ref 7.0–30.0)

## 2020-07-12 LAB — ETHANOL: Alcohol, Ethyl (B): 179 mg/dL — ABNORMAL HIGH (ref <10)

## 2020-07-12 LAB — ACETAMINOPHEN LEVEL: Acetaminophen (Tylenol), Serum: <10 ug/mL — ABNORMAL LOW (ref 10–30)

## 2020-07-12 MED ORDER — CHLORDIAZEPOXIDE HCL 25 MG PO CAPS: ORAL_CAPSULE | ORAL | 0 refills | Status: DC

## 2020-07-12 MED ORDER — LORAZEPAM 1 MG PO TABS
2.0000 mg | ORAL_TABLET | Freq: Once | ORAL | Status: AC
  Administered 2020-07-12: 2 mg via ORAL
  Filled 2020-07-12: qty 2

## 2020-07-12 NOTE — ED Provider Notes (Cosign Needed Addendum)
Care received from [facility].  Please see her note for full HPI.  In short, 55 year old male with a history of alcoholism who presented to the ED with alcohol intoxication and suicidal ideation.  They have been seen here recently in the ER for the same, was evaluated by TTS 2 days ago.  Plan was to check basic labs, reevaluate the patient status.   7:37 AM: I was called to bedside by nursing staff.  Patient is expressing desire to leave.  Speech is clear, coherent.  Stable on their feet.  Denying any SI/HI currently.  They states this happens when he drinks alcohol.  They are seeking treatment in [place] within the next few days.  They feel as though his he is withdrawing.  We will give 2 of Ativan and provide Librium taper.  No indication for TTS evaluation at this time.  8 AM: Called lab, CMP is lipemic, sent out to [facility].  Will take several hours to return.  Patient was informed of this, encouraged him to stay for the results so that we can check his LFTs and electrolytes.  He is agreeable to this at this time.  12:06 PM: Despite multiple calls to the lab, CMP still not resulted.  Patient getting increasingly anxious, reports that he would like to leave.  I explained to him that he would have to leave AGAINST MEDICAL ADVICE given that we do not have a CMP results back.  We discussed the risks of this including severe disability and death.  He understands the risks of this, and still would like to leave.  No evidence of delirium tremens or severe withdrawal symptoms.  I deemed the patient capable of making his own decisions at this time.  Denying SI/HI.  Patient will be discharged AMA.  He reports planning on going to rehab facility out in [place].  Will prescribe Librium.  Return precautions discussed.  They voiced understanding is agreeable.  At this stage in ED course, the patient is medically screened and stable for discharge. Physical Exam  BP (!) 149/106   Pulse 90   Temp 98.4 F (36.9  C) (Oral)   Resp 18   Ht 5\' 10"  (1.778 m)   Wt 90.7 kg   SpO2 96%   BMI 28.70 kg/m   Physical Exam Vitals and nursing note reviewed.  Constitutional:      General: Nicolas [name] is not in acute distress.    Appearance: Nicolas [name] is well-developed and well-nourished. Nicolas [name] is not ill-appearing, toxic-appearing or diaphoretic.  HENT:     Head: Normocephalic and atraumatic.  Eyes:     Conjunctiva/sclera: Conjunctivae normal.  Cardiovascular:     Rate and Rhythm: Normal rate and regular rhythm.     Heart sounds: No murmur heard.   Pulmonary:     Effort: Pulmonary effort is normal. No respiratory distress.     Breath sounds: Normal breath sounds.  Abdominal:     Palpations: Abdomen is soft.     Tenderness: There is no abdominal tenderness.  Musculoskeletal:        General: No edema. Normal range of motion.     Cervical back: Neck supple.  Skin:    General: Skin is warm and dry.  Neurological:     General: No focal deficit present.     Mental Status: Nicolas [name] is alert and oriented to person, place, and time.  Psychiatric:        Mood and Affect: Mood and affect and mood  normal.        Behavior: Behavior normal.        Thought Content: Thought content normal.        Judgment: Judgment normal.     ED Course/Procedures     Procedures  MDM             Mare Ferrari, PA-C 07/12/20 1211    Linwood Dibbles, MD 07/13/20 [phone]

## 2020-07-12 NOTE — ED Notes (Signed)
Pt was ambulatory to the hall bed on arrival.

## 2020-07-12 NOTE — ED Notes (Signed)
Pt awoken from napping, immediately to RN station asking for more ativan.  Pt informed that it is too soon for another medication administration, to which he replies "well, I guess I'll go home then"  EDP made aware.

## 2020-07-12 NOTE — ED Notes (Signed)
Lab contacted in regards to delay in processing of pts CMP, per Lab staff, pts blood sample had to be couriered to [facility] hospital for testing.  EDP and pt made aware of delay.

## 2020-07-12 NOTE — Discharge Instructions (Addendum)
You understand the risks of leaving AGAINST MEDICAL ADVICE include severe disability and death.  Please take the Librium as directed.  Please feel free to return to the ER for any new or worsening symptoms.

## 2020-07-12 NOTE — ED Notes (Signed)
2 patient belonging bags returned to pt.  

## 2020-07-12 NOTE — ED Notes (Signed)
Discharge paperwork reviewed with pt, including prescription. Pt verbalized understanding, was asking whether his labwork was "good or bad."  RN informed pt that I am unable to interpret lab work but would be happy to print him out a copy of his labwork.  Pt declined this information.

## 2020-07-12 NOTE — ED Triage Notes (Signed)
Pt BIBA from home-  Per EMS- Pt reports last drink 3-4 hours PTA, also began talking about "wanting to shoot himself in the head." Pt recently seen here for same.   Pt arrives voluntarily, calm and cooperative.

## 2020-07-12 NOTE — ED Notes (Signed)
Pt now sleeping, prone in stretcher.

## 2020-07-12 NOTE — ED Provider Notes (Signed)
Nicolas [name]   CSN: [id number] Arrival date & time: 07/12/20  8127     History Chief Complaint  Patient presents with  . Alcohol Intoxication  . Suicidal    Nicolas [name] is a 55 y.o. adult.  The history is provided by the patient and medical records.  Alcohol Intoxication   55 year old male with history of alcohol abuse, hypertension, pancreatitis, presenting to the ED acutely intoxicated.  States "I had to call someone before I drank myself to death".  States he has drank a lot over the past 2 days, approximately 2 cases of beer (48 total).  Last drink just a few hours ago.  He states he has had thoughts of shooting himself in the head but has not attempted self harm.  Denies other co-ingestion.    Patient with multiple recent visits, this is actually his 3rd visit in the past 10 days for similar related complaints.   Past Medical History:  Diagnosis Date  . Alcohol abuse   . Hypertension   . Pancreatitis     Patient Active Problem List   Diagnosis Date Noted  . Pancreatitis 05/10/2020  . Acute alcoholic pancreatitis 05/09/2020  . GERD (gastroesophageal reflux disease) 11/03/2019  . Alcoholic hepatitis 11/02/2019  . Major depressive disorder, recurrent episode, severe (HCC) 10/21/2019  . 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 Acute pancreatitis 10/20/2019  . Alcohol use disorder, severe, dependence (HCC) 10/16/2019    Past Surgical History:  Procedure Laterality Date  . NASAL SEPTUM SURGERY         No family history on file.  Social History   Tobacco Use  . Smoking status: Current Every Day Smoker    Packs/day: 0.25    Types: Cigarettes  . Smokeless tobacco: Never Used  Vaping Use  . Vaping Use: Never used  Substance Use Topics  . Alcohol use: Yes    Alcohol/week: 42.0 standard drinks    Types: 42 Cans of beer per week    Comment: daily use  . Drug use: Not Currently    Types: Cocaine    Comment:  stopped 01/2019    Home Medications Prior to Admission medications   Medication Sig Start Date End Date Taking? Authorizing Provider  atenolol (TENORMIN) 25 MG tablet Take 1 tablet (25 mg total) by mouth daily. 06/17/20   Money, Gerlene Burdock, FNP  Calcium Carb-Cholecalciferol (CALCIUM 1000 + D) 1000-800 MG-UNIT TABS Take 1 tablet by mouth once a week.    [provider]  fenofibrate 160 MG tablet Take 1 tablet (160 mg total) by mouth daily. 06/17/20   Money, Gerlene Burdock, FNP  Multiple Vitamin (DAILY VITE) TABS Take 1 tablet by mouth once a week.    [provider]  omeprazole (PRILOSEC) 20 MG capsule Take 1 capsule (20 mg total) by mouth daily. 06/17/20   Money, Gerlene Burdock, FNP  sertraline (ZOLOFT) 25 MG tablet Take 2 tablets (50 mg total) by mouth daily. 05/12/20 06/11/20  Zigmund Daniel., MD  sertraline (ZOLOFT) 50 MG tablet Take 50 mg by mouth daily. Patient not taking: No sig reported 06/01/20   [provider]  sucralfate (CARAFATE) 1 g tablet Take 1 g by mouth 4 (four) times daily.    [provider]  VITAMINS B1 B6 B12 PO Take 1 tablet by mouth once a week.    [provider]    Allergies    Caffeine and Bean pod extract  Review of Systems  Review of Systems  Psychiatric/Behavioral: Positive for suicidal ideas.  All other systems reviewed and are negative.   Physical Exam Updated Vital Signs BP (!) 155/96 (BP Location: Left Arm)   Pulse 85   Temp 98.4 F (36.9 C) (Oral)   Resp 16   Ht 5\' 10"  (1.778 m)   Wt 90.7 kg   SpO2 98%   BMI 28.70 kg/m   Physical Exam Vitals and nursing [name] reviewed.  Constitutional:      Appearance: Nicolas [name] is well-developed and well-nourished.     Comments: Intoxicated  HENT:     Head: Normocephalic and atraumatic.     Mouth/Throat:     Mouth: Oropharynx is clear and moist.  Eyes:     Extraocular Movements: EOM normal.     Conjunctiva/sclera: Conjunctivae normal.     Pupils: Pupils are  equal, round, and reactive to light.  Cardiovascular:     Rate and Rhythm: Normal rate and regular rhythm.     Heart sounds: Normal heart sounds.  Pulmonary:     Effort: Pulmonary effort is normal.     Breath sounds: Normal breath sounds.  Abdominal:     General: Bowel sounds are normal.     Palpations: Abdomen is soft.  Musculoskeletal:        General: Normal range of motion.     Cervical back: Normal range of motion.  Skin:    General: Skin is warm and dry.  Neurological:     Mental Status: Nicolas [name] is alert and oriented to person, place, and time.  Psychiatric:        Mood and Affect: Mood and affect normal.     Comments: Rambling speech     ED Results / Procedures / Treatments   Labs (all labs ordered are listed, but only abnormal results are displayed) Labs Reviewed - No data to display  EKG None  Radiology No results found.  Procedures Procedures   Medications Ordered in ED Medications - No data to display  ED Course  I have reviewed the triage vital signs and the nursing notes.  Pertinent labs & imaging results that were available during my care of the patient were reviewed by me and considered in my medical decision making (see chart for details).    MDM Rules/Calculators/A&P  55 y.o. M here acutely intoxicated.  States he called EMS before he "drank himself to death".  Also reports thoughts of wanting to shoot himself in the head.  Multiple recent visits for same.  He is acutely intoxicated here, rambling speech.  He is ambulatory.  VSS.  Will obtain screening labs.  He will need to clinically sober, then can consider TTS consultation if still feeling suicidal.  Care will be signed out to oncoming provider to follow-up on labs and reassess once clinically sober.  Final Clinical Impression(s) / ED Diagnoses Final diagnoses:  Alcoholic intoxication without complication [facility])    Rx / DC Orders ED Discharge Orders    None       [facility],  PA-C 07/12/20 07/14/20    [id number], MD 07/12/20 [phone]

## 2020-07-12 NOTE — ED Notes (Signed)
Pt to the RN station, stating he wants to leave, EDP made aware.

## 2020-07-12 NOTE — ED Notes (Signed)
Pt provided with 2nd cup of water, per his request.

## 2020-07-12 NOTE — ED Notes (Addendum)
Pt denies AVH, reports SI without plan. Pt pacing back and forth, talking to all staff members, reporting "my liver hurts." Pt reports last drink appx 0130 today.

## 2020-07-12 NOTE — ED Notes (Signed)
EDP at bedside  

## 2020-07-12 NOTE — ED Notes (Signed)
Pt now yelling, reporting that he is going into alcohol withdrawal, pacing back and forth, "no one is doing anything for me.... the complaints continue.  The pt is at the desk yelling, reporting "why did I come here.Nicolas [name].fuck it I want to leave."  EDP made aware.

## 2020-07-12 NOTE — ED Notes (Signed)
Pt given meal tray.

## 2020-07-12 NOTE — ED Notes (Signed)
Per lab staff, CMP received at [facility], is being processed now.  EDP made aware.

## 2020-07-14 ENCOUNTER — Ambulatory Visit: Payer: Self-pay | Admitting: *Deleted

## 2020-07-15 ENCOUNTER — Emergency Department (HOSPITAL_COMMUNITY)
Admission: EM | Admit: 2020-07-15 | Discharge: 2020-07-16 | Disposition: A | Discharge status: Home / Self Care | Payer: 59 | Attending: Emergency Medicine | Admitting: Emergency Medicine

## 2020-07-15 ENCOUNTER — Encounter (HOSPITAL_COMMUNITY): Payer: Self-pay

## 2020-07-15 DIAGNOSIS — F10129 Alcohol abuse with intoxication, unspecified: Secondary | ICD-10-CM | POA: Diagnosis not present

## 2020-07-15 DIAGNOSIS — R45851 Suicidal ideations: Secondary | ICD-10-CM | POA: Diagnosis not present

## 2020-07-15 DIAGNOSIS — F17210 Nicotine dependence, cigarettes, uncomplicated: Secondary | ICD-10-CM | POA: Diagnosis not present

## 2020-07-15 DIAGNOSIS — I10 Essential (primary) hypertension: Secondary | ICD-10-CM | POA: Diagnosis not present

## 2020-07-15 DIAGNOSIS — F1010 Alcohol abuse, uncomplicated: Secondary | ICD-10-CM | POA: Diagnosis not present

## 2020-07-15 DIAGNOSIS — U071 COVID-19: Secondary | ICD-10-CM | POA: Insufficient documentation

## 2020-07-15 DIAGNOSIS — Z79899 Other long term (current) drug therapy: Secondary | ICD-10-CM | POA: Diagnosis not present

## 2020-07-15 LAB — CBC WITH DIFFERENTIAL/PLATELET
Abs Immature Granulocytes: 0.01 10*3/uL (ref 0.00–0.07)
Basophils Absolute: 0 10*3/uL (ref 0.0–0.1)
Basophils Relative: 1 %
Eosinophils Absolute: 0.1 10*3/uL (ref 0.0–0.5)
Eosinophils Relative: 2 %
HCT: 44.4 % (ref 39.0–52.0)
Hemoglobin: 15.9 g/dL (ref 13.0–17.0)
Immature Granulocytes: 0 %
Lymphocytes Relative: 61 %
Lymphs Abs: 2.1 10*3/uL (ref 0.7–4.0)
MCH: 37 pg — ABNORMAL HIGH (ref 26.0–34.0)
MCHC: 35.8 g/dL (ref 30.0–36.0)
MCV: 103.3 fL — ABNORMAL HIGH (ref 80.0–100.0)
Monocytes Absolute: 0.5 10*3/uL (ref 0.1–1.0)
Monocytes Relative: 14 %
Neutro Abs: 0.7 10*3/uL — ABNORMAL LOW (ref 1.7–7.7)
Neutrophils Relative %: 22 %
Platelets: 185 10*3/uL (ref 150–400)
RBC: 4.3 MIL/uL (ref 4.22–5.81)
RDW: 13.7 % (ref 11.5–15.5)
WBC: 3.3 10*3/uL — ABNORMAL LOW (ref 4.0–10.5)
nRBC: 0 % (ref 0.0–0.2)

## 2020-07-15 LAB — COMPREHENSIVE METABOLIC PANEL
ALT: 39 U/L (ref 0–44)
AST: 113 U/L — ABNORMAL HIGH (ref 15–41)
Albumin: 4.2 g/dL (ref 3.5–5.0)
Alkaline Phosphatase: 81 U/L (ref 38–126)
Anion gap: 18 — ABNORMAL HIGH (ref 5–15)
BUN: <5 mg/dL — ABNORMAL LOW (ref 6–20)
CO2: 19 mmol/L — ABNORMAL LOW (ref 22–32)
Calcium: 9 mg/dL (ref 8.9–10.3)
Chloride: 103 mmol/L (ref 98–111)
Creatinine, Ser: 0.77 mg/dL (ref 0.61–1.24)
GFR, Estimated: >60 mL/min (ref >60)
Glucose, Bld: 134 mg/dL — ABNORMAL HIGH (ref 70–99)
Potassium: 5.1 mmol/L (ref 3.5–5.1)
Sodium: 140 mmol/L (ref 135–145)
Total Bilirubin: 1.6 mg/dL — ABNORMAL HIGH (ref 0.3–1.2)
Total Protein: 6.7 g/dL (ref 6.5–8.1)

## 2020-07-15 LAB — ACETAMINOPHEN LEVEL

## 2020-07-15 LAB — SARS CORONAVIRUS 2 BY RT PCR (HOSPITAL ORDER, PERFORMED IN ~~LOC~~ HOSPITAL LAB): SARS Coronavirus 2: POSITIVE — AB

## 2020-07-15 LAB — RAPID URINE DRUG SCREEN, HOSP PERFORMED
Amphetamines: NOT DETECTED
Barbiturates: NOT DETECTED
Benzodiazepines: NOT DETECTED
Cocaine: NOT DETECTED
Opiates: NOT DETECTED
Tetrahydrocannabinol: NOT DETECTED

## 2020-07-15 LAB — ETHANOL: Alcohol, Ethyl (B): 226 mg/dL — ABNORMAL HIGH (ref <10)

## 2020-07-15 LAB — SALICYLATE LEVEL: Salicylate Lvl: <7 mg/dL — ABNORMAL LOW (ref 7.0–30.0)

## 2020-07-15 MED ORDER — LORAZEPAM 1 MG PO TABS: 0.0000 mg | ORAL_TABLET | Freq: Two times a day (BID) | ORAL | Status: DC

## 2020-07-15 MED ORDER — LORAZEPAM 1 MG PO TABS
2.0000 mg | ORAL_TABLET | Freq: Once | ORAL | Status: AC
  Administered 2020-07-15: 2 mg via ORAL
  Filled 2020-07-15: qty 2

## 2020-07-15 MED ORDER — THIAMINE HCL 100 MG PO TABS
100.0000 mg | ORAL_TABLET | Freq: Every day | ORAL | Status: DC
  Administered 2020-07-15 – 2020-07-16 (×2): 100 mg via ORAL
  Filled 2020-07-15 (×2): qty 1

## 2020-07-15 MED ORDER — LORAZEPAM 2 MG/ML IJ SOLN: 0.0000 mg | Freq: Four times a day (QID) | INTRAMUSCULAR | Status: DC

## 2020-07-15 MED ORDER — LORAZEPAM 2 MG/ML IJ SOLN: 0.0000 mg | Freq: Two times a day (BID) | INTRAMUSCULAR | Status: DC

## 2020-07-15 MED ORDER — THIAMINE HCL 100 MG/ML IJ SOLN: 100.0000 mg | Freq: Every day | INTRAMUSCULAR | Status: DC

## 2020-07-15 MED ORDER — LORAZEPAM 1 MG PO TABS
0.0000 mg | ORAL_TABLET | Freq: Four times a day (QID) | ORAL | Status: DC
  Administered 2020-07-15 – 2020-07-16 (×4): 2 mg via ORAL
  Filled 2020-07-15 (×4): qty 2

## 2020-07-15 NOTE — ED Provider Notes (Signed)
[facility]   CSN: [id number] Arrival date & time: 07/15/20  1224     History Chief Complaint  Patient presents with  . Alcohol Intoxication    Pt stated that his last alcohol use was this AM at 10:30.  . Suicidal    During assessment, Pt denied SI with plan or intent    Nicolas [name] is a 55 y.o. adult.  Patient presents to ER chief complaint of alcohol abuse and thoughts of self-harm.  He states his last use of alcohol was yesterday.  He has called a rehab facility and is trying to participate in outpatient rehab, but is required to undergo some medical clearance and presents here.  However the patient also states that has been continued to have thoughts of suicide.  He is a spouse he is thought before during his several visits in the past.  He was last seen here 2 days ago and left AGAINST MEDICAL ADVICE after stating he no longer had thoughts of suicide.  However the patient states that during his last couple of days he is continue to consider thoughts of self-harm.  He has a plan of obtaining a gun and shooting himself.  Is cooperative at this time.        Past Medical History:  Diagnosis Date  . Alcohol abuse   . Hypertension   . Pancreatitis     Patient Active Problem List   Diagnosis Date Noted  . Alcohol abuse   . Pancreatitis 05/10/2020  . Acute alcoholic pancreatitis 05/09/2020  . GERD (gastroesophageal reflux disease) 11/03/2019  . Alcoholic hepatitis 11/02/2019  . Major depressive disorder, recurrent episode, severe (HCC) 10/21/2019  . 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 Acute pancreatitis 10/20/2019  . Alcohol use disorder, severe, dependence (HCC) 10/16/2019    Past Surgical History:  Procedure Laterality Date  . NASAL SEPTUM SURGERY         History reviewed. No pertinent family history.  Social History   Tobacco Use  . Smoking status: Current Every Day Smoker    Packs/day: 0.25    Types:  Cigarettes  . Smokeless tobacco: Never Used  Vaping Use  . Vaping Use: Never used  Substance Use Topics  . Alcohol use: Yes    Alcohol/week: 42.0 standard drinks    Types: 42 Cans of beer per week    Comment: daily use  . Drug use: Not Currently    Types: Cocaine    Comment: stopped 01/2019    Home Medications Prior to Admission medications   Medication Sig Start Date End Date Taking? Authorizing Provider  atenolol (TENORMIN) 25 MG tablet Take 1 tablet (25 mg total) by mouth daily. 06/17/20   Money, Gerlene Burdock, FNP  Calcium Carb-Cholecalciferol (CALCIUM 1000 + D) 1000-800 MG-UNIT TABS Take 1 tablet by mouth once a week.    [provider]  chlordiazePOXIDE (LIBRIUM) 25 MG capsule 50mg  PO TID x 1D, then 25-50mg  PO BID X 1D, then 25-50mg  PO QD X 1D 07/12/20   07/14/20, PA-C  fenofibrate 160 MG tablet Take 1 tablet (160 mg total) by mouth daily. 06/17/20   Money, 08/15/20, FNP  Multiple Vitamin (DAILY VITE) TABS Take 1 tablet by mouth once a week.    [provider]  omeprazole (PRILOSEC) 20 MG capsule Take 1 capsule (20 mg total) by mouth daily. 06/17/20   Money, 08/15/20, FNP  sertraline (ZOLOFT) 25 MG tablet Take 2 tablets (50 mg total) by  mouth daily. 05/12/20 06/11/20  Zigmund Daniel., MD  sertraline (ZOLOFT) 50 MG tablet Take 50 mg by mouth daily. Patient not taking: No sig reported 06/01/20   [provider]  sucralfate (CARAFATE) 1 g tablet Take 1 g by mouth 4 (four) times daily.    [provider]  VITAMINS B1 B6 B12 PO Take 1 tablet by mouth once a week.    [provider]    Allergies    Caffeine and Bean pod extract  Review of Systems   Review of Systems  Constitutional: Negative for fever.  HENT: Negative for ear pain and sore throat.   Eyes: Negative for pain.  Respiratory: Negative for cough.   Cardiovascular: Negative for chest pain.  Gastrointestinal: Negative for abdominal pain.  Genitourinary: Negative for flank  pain.  Musculoskeletal: Negative for back pain.  Skin: Negative for color change and rash.  Neurological: Negative for syncope.  All other systems reviewed and are negative.   Physical Exam Updated Vital Signs BP (!) 153/110 (BP Location: Left Arm)   Pulse 93   Temp 98 F (36.7 C)   Resp 16   SpO2 97%   Physical Exam Constitutional:      General: Nicolas [name] is not in acute distress.    Appearance: Nicolas [name] is well-developed.  HENT:     Head: Normocephalic.     Nose: Nose normal.  Eyes:     Extraocular Movements: Extraocular movements intact.  Cardiovascular:     Rate and Rhythm: Normal rate.  Pulmonary:     Effort: Pulmonary effort is normal.  Skin:    Coloration: Skin is not jaundiced.  Neurological:     Mental Status: Nicolas [name] is alert. Mental status is at baseline.     ED Results / Procedures / Treatments   Labs (all labs ordered are listed, but only abnormal results are displayed) Labs Reviewed  SARS CORONAVIRUS 2 BY RT PCR (HOSPITAL ORDER, PERFORMED IN [facility]) - Abnormal; Notable for the following components:      Result Value   SARS Coronavirus 2 POSITIVE (*)    All other components within normal limits  CBC WITH DIFFERENTIAL/PLATELET - Abnormal; Notable for the following components:   WBC 3.3 (*)    MCV 103.3 (*)    MCH 37.0 (*)    All other components within normal limits  ETHANOL - Abnormal; Notable for the following components:   Alcohol, Ethyl (B) 226 (*)    All other components within normal limits  SALICYLATE LEVEL - Abnormal; Notable for the following components:   Salicylate Lvl <7.0 (*)    All other components within normal limits  RAPID URINE DRUG SCREEN, HOSP PERFORMED  ACETAMINOPHEN LEVEL  COMPREHENSIVE METABOLIC PANEL    EKG None  Radiology No results found.  Procedures Procedures   Medications Ordered in ED Medications  LORazepam (ATIVAN) injection 0-4 mg ( Intravenous See Alternative 07/15/20  1453)    Or  LORazepam (ATIVAN) tablet 0-4 mg (2 mg Oral Given 07/15/20 1453)  LORazepam (ATIVAN) injection 0-4 mg (has no administration in time range)    Or  LORazepam (ATIVAN) tablet 0-4 mg (has no administration in time range)  thiamine tablet 100 mg (100 mg Oral Given 07/15/20 1453)    Or  thiamine (B-1) injection 100 mg ( Intravenous See Alternative 07/15/20 1453)  LORazepam (ATIVAN) tablet 2 mg (2 mg Oral Given 07/15/20 1400)    ED Course  I have reviewed the  triage vital signs and the nursing notes.  Pertinent labs & imaging results that were available during my care of the patient were reviewed by me and considered in my medical decision making (see chart for details).    MDM Rules/Calculators/A&P                          Patient awaiting medical clearance and behavioral team evaluation. HE has been placed on a IVC for suicidal thoughts.  I called the patient's alcohol treatment center he had planned to go to today at [facility] treatment center.  However they state that they are unable to carefor  patient's with suicidal thoughts.  Patient is medically cleared, pending TTS evaluation.  Final Clinical Impression(s) / ED Diagnoses Final diagnoses:  Suicidal thoughts  Alcohol abuse  COVID-19 virus infection    Rx / DC Orders ED Discharge Orders    None       Cheryll Cockayne, MD 07/15/20 [phone]

## 2020-07-15 NOTE — ED Notes (Signed)
Patient refused to stay in his room, states he does not want to be alone. Was provided with purple scrubs and told to dress out. Patient remains in the hallway asking to talk to staff so he is not alone.

## 2020-07-15 NOTE — ED Notes (Signed)
RN made aware of BP 

## 2020-07-15 NOTE — BH Assessment (Signed)
Comprehensive Clinical Assessment (CCA) Note  07/15/2020 Nicolas [name] [id number]  Chief Complaint:  Chief Complaint  Patient presents with  . Alcohol Intoxication    Pt stated that his last alcohol use was this AM at 10:30.  . Suicidal    During assessment, Pt denied SI with plan or intent   Visit Diagnosis: Alcohol Dependency; Alcohol lntoxication  NARRATIVE:  Pt is a 55 year old person (identifies as non-binary) who presented to [facility] on a voluntary basis with request of alcohol detox services.  Pt is going to an alcohol rehab facility on Friday, and he wants to stop drinking before then.  He requested detox services.  Pt was last assessed on 07/12/20 with similar request.  On that date, Pt presented under IVC.  He was assessed and released from IVC.  Social work secured placement for Pt at [facility], but Pt declined, and he left AMA.  Pt now returns requesting detox services.  Pt told hospital staff that he is suicidal and that he has a plan to shoot himself.  Author assessed Pt.  Pt complained of the echo on the telepsych cart.  He denied suicidal ideation and seemed surprised when told that he had threatened suicide.  He then stated, ''Of course I'm suicidal.  I'm in a hospital surrounded by COVID.  But I wouldn't hurt myself.''  He stated that he is slowly killing himself with alcohol.  Pt stated that he wants detox, complained that he cannot get Ativan, and stated, ''You're going to let me go home, and then I'm going to have to drink again.  You kicked me out last time, and I had to go home and start drinking.''  Pt complained of withdrawal symptoms, and he was observed rocking back and forth.    Pt denied active suicidal ideation, homicidal ideation, self-injurious behavior, and hallucination.  He also denied any past or present therapy or psychiatric treatment.  Pt stated that he is scheduled to begin rehab services at "[facility]'' on Friday.  He stated that his last use of  alcohol was an unknown quantity of beer at 10:30 AM today.  Per history, Pt reported consumption of 16 12 oz. Beers per day.  Pt's BAC on admission today was 226.  During assessment, Pt presented as alert and oriented.  He had fair eye contact.  Pt was defensive in his demeanor.  He was in scrubs and appeared appropriately groomed.  Pt's mood was irritable and anxious.  Affect was mood-congruent.  Contrary to earlier statement, Pt denied suicidal ideation with plan today, instead endorsing passive suicidal ideation.  Speech was normal in rate, rhythm, and volume.  Thought processes were within normal range, and thought content was logical and goal-oriented.  There was no evidence of delusion.  Pt's memory and concentration were intact.  Insight and judgment were fair.  Impulse control was deemed poor as evidenced by ongoing alcohol use.  Consulted with S. Rankin, NP, who determined that Pt is suitable for [facility].  Recommend transfer to [facility] for stabilization.  CCA Screening, Triage and Referral (STR)  Patient Reported Information How did you hear about [place]? Self  Referral name: EMS (Phreesia 07/01/2020)  Referral phone number: No data recorded  Whom do you see for routine medical problems? Primary Care  Practice/Facility Name: [facility]  Practice/Facility Phone Number: No data recorded Name of Contact: Turkey Rankins  Contact Number: No data recorded Contact Fax Number: No data recorded Prescriber Name: Na (Phreesia 07/01/2020)  Prescriber  Address (if known): No data recorded  What Is the Reason for Your Visit/Call Today? Pt seeks detox, stating he is scheduled to go to rehab on Friday  How Long Has This Been Causing You Problems? > than 6 months  What Do You Feel Would Help You the Most Today? Medication (''You won't give me Ativan'')   Have You Recently Been in Any Inpatient Treatment (Hospital/Detox/Crisis Center/28-Day Program)? Yes  Name/Location of  Program/Hospital:[facility]  7days  How Long Were You There? 7 Days  When Were You Discharged? No data recorded  Have You Ever Received Services From [facility] Before? Yes  Who Do You See at [facility]? TTS, ED   Have You Recently Had Any Thoughts About Hurting Yourself? Yes (Pt endorsed SI with plan to hospital staff; now denies)  Are You Planning to Commit Suicide/Harm Yourself At This time? No   Have you Recently Had Thoughts About Hurting Someone Karolee Ohs? No  Explanation: No data recorded  Have You Used Any Alcohol or Drugs in the Past 24 Hours? Yes  How Long Ago Did You Use Drugs or Alcohol? 0400  What Did You Use and How Much? Unknown quantity of beer   Do You Currently Have a Therapist/Psychiatrist? No (''I've never talked to a therapist'')  Name of Therapist/Psychiatrist: No data recorded  Have You Been Recently Discharged From Any Office Practice or Programs? No  Explanation of Discharge From Practice/Program: No data recorded    CCA Screening Triage Referral Assessment Type of Contact: Tele-Assessment  Is this Initial or Reassessment? Initial Assessment  Date Telepsych consult ordered in [facility]:  07/15/2020  Time Telepsych consult ordered in [facility]:  1856   Patient Reported Information Reviewed? Yes  Patient Left Without Being Seen? No data recorded Reason for Not Completing Assessment: No data recorded  Collateral Involvement: NA   Does Patient Have a Court Appointed Legal Guardian? No data recorded Name and Contact of Legal Guardian: Self.   If Minor and Not Living with Parent(s), Who has Custody? N/A  Is CPS involved or ever been involved? Never  Is APS involved or ever been involved? Never   Patient Determined To Be At Risk for Harm To Self or Others Based on Review of Patient Reported Information or Presenting Complaint? No  Method: No data recorded Availability of Means: No data recorded Intent: No data recorded Notification Required: No  data recorded Additional Information for Danger to Others Potential: No data recorded Additional Comments for Danger to Others Potential: No data recorded Are There Guns or Other Weapons in Your Home? No data recorded Types of Guns/Weapons: No data recorded Are These Weapons Safely Secured?                            No data recorded Who Could Verify You Are Able To Have These Secured: No data recorded Do You Have any Outstanding Charges, Pending Court Dates, Parole/Probation? No data recorded Contacted To Inform of Risk of Harm To Self or Others: Other: Comment (N/A)   Location of Assessment: [facility] ED   Does Patient Present under Involuntary Commitment? No  IVC Papers Initial File Date: No data recorded  [place] of Residence: [place]   Patient Currently Receiving the Following Services: Not Receiving Services   Determination of Need: Emergent (2 hours)   Options For Referral: Chemical Dependency Intensive Outpatient Therapy (CDIOP); BH Urgent Care     CCA Biopsychosocial Intake/Chief Complaint:  Pt stated that  he wants detox services; had endorsed SI to staff, no longer does so  Current Symptoms/Problems: Irritability   Patient Reported Schizophrenia/Schizoaffective Diagnosis in Past: No   Strengths: Some history of sobriety; going to rehab facility on Friday  Preferences: Detox  Abilities: No data recorded  Type of Services Patient Feels are Needed: Medication for detox   Initial Clinical Notes/Concerns: Pt seemed surprised to hear he had endorsed SI earlier.   Mental Health Symptoms Depression:  Increase/decrease in appetite   Duration of Depressive symptoms: Greater than two weeks   Mania:  N/A   Anxiety:   Difficulty concentrating; Irritability; Restlessness; Tension; Worrying; Sleep   Psychosis:  None   Duration of Psychotic symptoms: No data recorded  Trauma:  N/A   Obsessions:  N/A   Compulsions:  N/A   Inattention:  N/A    Hyperactivity/Impulsivity:  N/A   Oppositional/Defiant Behaviors:  N/A   Emotional Irregularity:  Recurrent suicidal behaviors/gestures/threats   Other Mood/Personality Symptoms:  No data recorded   Mental Status Exam Appearance and self-care  Stature:  Average   Weight:  Average weight   Clothing:  Casual   Grooming:  Normal   Cosmetic use:  -- (Pink fingernails)   Posture/gait:  Normal   Motor activity:  Restless   Sensorium  Attention:  Distractible   Concentration:  Anxiety interferes   Orientation:  X5   Recall/memory:  Defective in Immediate   Affect and Mood  Affect:  Anxious   Mood:  Anxious; Irritable   Relating  Eye contact:  Normal   Facial expression:  Tense; Anxious   Attitude toward examiner:  Defensive   Thought and Language  Speech flow: Clear and Coherent   Thought content:  Appropriate to Mood and Circumstances   Preoccupation:  None   Hallucinations:  None   Organization:  No data recorded  [employer] of Knowledge:  Average   Intelligence:  Average   Abstraction:  Normal   Judgement:  Fair   Reality Testing:  Adequate   Insight:  Fair   Decision Making:  Impulsive   Social Functioning  Social Maturity:  Impulsive   Social Judgement:  Victimized   Stress  Stressors:  Other (Comment) (Continued alcohol use)   Coping Ability:  Exhausted   Skill Deficits:  Self-control   Supports:  Family     Religion: Religion/Spirituality Are You A Religious Person?: No  Leisure/Recreation: Leisure / Recreation Do You Have Hobbies?: Yes Leisure and Hobbies: Environmental manager  Exercise/Diet: Exercise/Diet Have You Gained or Lost A Significant Amount of Weight in the Past Six Months?: Yes-Gained Do You Have Any Trouble Sleeping?: Yes Explanation of Sleeping Difficulties: between 5-7 hours of sleep. 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 Has patient ever been in the [employer]?: No  Education: Education Is Patient Currently Attending School?: No   CCA Family/Childhood History Family and Relationship History: Family history Marital status: Single Does patient have children?: No  Childhood History:  Childhood History Does patient have siblings?: Yes Did patient suffer any verbal/emotional/physical/sexual abuse as a child?: No Did patient suffer from severe childhood neglect?: No Has patient ever been sexually abused/assaulted/raped as an adolescent or adult?: No  Child/Adolescent Assessment:     CCA Substance Use Alcohol/Drug Use: Alcohol / Drug Use Pain Medications: Please see MAR Prescriptions: Please see MAR Over the Counter: Please see MAR History  of alcohol / drug use?: Yes Longest period of sobriety (when/how long): approx 25 days of sobriety Negative Consequences of Use: Financial,Personal relationships,Work / School Withdrawal Symptoms: Fever / Chills,Nausea / Vomiting,Agitation,Patient aware of relationship between substance abuse and physical/medical complications,Sweats Substance #1 Name of Substance 1: Alcohol (beer and other) 1 - Amount (size/oz): Varied 1 - Frequency: Daily 1 - Duration: Two weeks of daily use 1 - Last Use / Amount: 4 hours ago; not sure of amount                       ASAM's:  Six Dimensions of Multidimensional Assessment  Dimension 1:  Acute Intoxication and/or Withdrawal Potential:   Dimension 1:  Description of individual's past and current experiences of substance use and withdrawal: gets very anxious and uncomfortable  Dimension 2:  Biomedical Conditions and Complications:   Dimension 2:  Description of patient's biomedical conditions and  complications: somatic  Dimension 3:  Emotional, Behavioral, or Cognitive Conditions  and Complications:  Dimension 3:  Description of emotional, behavioral, or cognitive conditions and complications: has friends/supports in AA  Dimension 4:  Readiness to Change:  Dimension 4:  Description of Readiness to Change criteria: Pt seeks detox; going to rehab on Friday  Dimension 5:  Relapse, Continued use, or Continued Problem Potential:     Dimension 6:  Recovery/Living Environment:     ASAM Severity Score: ASAM's Severity Rating Score: 6  ASAM Recommended Level of Treatment: ASAM Recommended Level of Treatment: Level I Outpatient Treatment   Substance use Disorder (SUD) Substance Use Disorder (SUD)  Checklist Symptoms of Substance Use: Continued use despite persistent or recurrent social, interpersonal problems, caused or exacerbated by use,Evidence of tolerance,Evidence of withdrawal (Comment),Persistent desire or unsuccessful efforts to cut down or control use,Recurrent use that results in a failure to fulfill major role obligations (work, school, home),Social, occupational, recreational activities given up or reduced due to use,Substance(s) often taken in larger amounts or over longer times than was intended  Recommendations for Services/Supports/Treatments: Recommendations for Services/Supports/Treatments Recommendations For Services/Supports/Treatments: Detox,CD-IOP Intensive Chemical Dependency Program  DSM5 Diagnoses: Patient Active Problem List   Diagnosis Date Noted  . Alcohol abuse   . Pancreatitis 05/10/2020  . Acute alcoholic pancreatitis 05/09/2020  . GERD (gastroesophageal reflux disease) 11/03/2019  . Alcoholic hepatitis 11/02/2019  . Major depressive disorder, recurrent episode, severe (HCC) 10/21/2019  . 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 Acute pancreatitis 10/20/2019  . Alcohol use disorder, severe, dependence (HCC) 10/16/2019    Patient Centered Plan: Patient is on the following Treatment Plan(s):     Referrals to Alternative Service(s): Referred to  Alternative Service(s):   Place:   Date:   Time:    Referred to Alternative Service(s):   Place:   Date:   Time:    Referred to Alternative Service(s):   Place:   Date:   Time:    Referred to Alternative Service(s):   Place:   Date:   Time:     Earline Mayotte, [facility]

## 2020-07-15 NOTE — ED Notes (Signed)
Spoke with Morrie Sheldon at detox center, states no transportation for pt available for detox center until Friday. States she has told pt this as well.

## 2020-07-15 NOTE — BH Assessment (Addendum)
Update on placement:  Pt is unable to go to [facility] as he is COVID+.  Pt needs overnight obs, stabilization, and evaluation.

## 2020-07-15 NOTE — ED Triage Notes (Signed)
Pt arrived via EMS, ETOH, states withdrawing from alcohol, last drink 1030am today. Endorses SI with plan to "shoot myself in the head" .   States he detox facility sent up to go to as well as transportation once medically cleared from ED today.

## 2020-07-15 NOTE — BH Assessment (Signed)
[facility] Assessment Progress Note  Per Shuvon Rankin, NP, pt is to be is transferred to the [facility].  Please call report to [phone].  Pt is to be transported via [employer].  EDP Coralee Pesa, DO and pt's nurse, Maralyn Sago, have been notified.  Doylene Canning, [place] Behavioral Health Coordinator [phone]

## 2020-07-16 ENCOUNTER — Telehealth: Payer: Self-pay | Admitting: *Deleted

## 2020-07-16 ENCOUNTER — Other Ambulatory Visit: Payer: Self-pay

## 2020-07-16 LAB — PATHOLOGIST SMEAR REVIEW

## 2020-07-16 MED ORDER — ONDANSETRON 4 MG PO TBDP: 4.0000 mg | ORAL_TABLET | Freq: Three times a day (TID) | ORAL | 0 refills | Status: DC | PRN

## 2020-07-16 MED ORDER — ACETAMINOPHEN 325 MG PO TABS
650.0000 mg | ORAL_TABLET | Freq: Once | ORAL | Status: AC
  Administered 2020-07-16: 650 mg via ORAL
  Filled 2020-07-16: qty 2

## 2020-07-16 MED ORDER — LORAZEPAM 1 MG PO TABS
1.0000 mg | ORAL_TABLET | Freq: Once | ORAL | Status: AC
  Administered 2020-07-16: 1 mg via ORAL
  Filled 2020-07-16: qty 1

## 2020-07-16 MED ORDER — ONDANSETRON 4 MG PO TBDP
4.0000 mg | ORAL_TABLET | Freq: Once | ORAL | Status: AC
  Administered 2020-07-16: 4 mg via ORAL
  Filled 2020-07-16: qty 1

## 2020-07-16 MED ORDER — CHLORDIAZEPOXIDE HCL 25 MG PO CAPS: ORAL_CAPSULE | ORAL | 0 refills | Status: DC

## 2020-07-16 NOTE — ED Provider Notes (Signed)
Emergency Medicine Observation Re-evaluation Note  Nicolas [name] is a 55 y.o. adult, seen on rounds today.  Pt initially presented to the ED for complaints of Alcohol Intoxication (Pt stated that his last alcohol use was this AM at 10:30.) and Suicidal (During assessment, Pt denied SI with plan or intent) Currently, the patient is awaiting re-evaluation.  Physical Exam  BP (!) 169/109 (BP Location: Right Arm)   Pulse (!) 103   Temp (!) 97.4 F (36.3 C) (Oral)   Resp 18   SpO2 96%  Physical Exam General: Calm Cardiac: Well perfused Lungs: Even, unlabored respirations  Psych: Calm  ED Course / MDM  EKG:    I have reviewed the labs performed to date as well as medications administered while in observation.  Recent changes in the last 24 hours include TTS re-evaluation. Patient is COVID positive and so cannot go to [facility]. Will re-evaluate in the AM.  11:24 AM Patient has been seen and psychiatrically cleared by TTS.  He apparently has a alcohol rehab treatment bed in [place] area.  He plans to go there for treatment.  Will consult PEER support. Denies SI. Will rescind IVC and d/c. In discussing with patient he is feeling some tremors typical of his EtOH withdrawal symptoms. No other findings to suspect alcohol withdrawal requiring hospital admission.   Discharged with Librium Rx and resource list along with peer consult. Discussed ED return precautions.   Plan  Current plan is for re-evaluation this AM. Patient is under full IVC at this time.   Maia Plan, MD 07/17/20 [phone]

## 2020-07-16 NOTE — BH Assessment (Signed)
Per Hillery Jacks, NP, this pt does not require psychiatric hospitalization at this time.  Pt presents under IVC initiated by EDP Norman Clay, MD which has been rescinded by EDP Alona Bene, MD.  Pt is psychiatrically cleared.  Discharge instructions include referral information for area substance abuse treatment providers.  Pt would also benefit from seeing a Peer Support Specialist, and a peer support consult has been ordered for pt..  Dr Jacqulyn Bath and charge nurse Kennyth Arnold have been notified.  Doylene Canning, MA Triage Specialist [phone]

## 2020-07-16 NOTE — Consult Note (Signed)
Telepsych Consultation   Reason for Consult: Alcohol abuse Referring Physician: EDP Location of Patient: [place] 01 Location of Provider: [facility]  Patient Identification: Nicolas [name] MRN:  [id number] Principal Diagnosis: <principal problem not specified> Diagnosis:  Active Problems:   * No active hospital problems. *   Total Time spent with patient: 15 minutes  Subjective:   Nicolas [name] is a 55 y.o. adult was seen and evaluated via teleassessment.  He is denying suicidal or homicidal ideations.  Denies auditory or visual hallucinations.  Does report alcohol withdrawal symptoms to include nausea, vomiting and uncontrollable shaking. Denied delusions or delirium. patient is requesting something stronger to help his withdrawal symptoms. States he was last in a detox facility 3 weeks prior.  BAL on admission 226.  [facility]  Nicolas [name] reported he has a bed available at [facility] on 07/17/2020. "  This may be complicated due to Covid positive now"  NP will place orders for peers support.  Patient is cleared by psychiatry. Support,encouragement and reassurance was provided  HPI:  Per admission assessment note: NARRATIVE:  Pt is a 55 year old person (identifies as non-binary) who presented to [facility] on a voluntary basis with request of alcohol detox services.  Pt is going to an alcohol rehab facility on Friday, and he wants to stop drinking before then.  He requested detox services.  Pt was last assessed on 07/12/20 with similar request.  On that date, Pt presented under IVC.  He was assessed and released from IVC.  Social work secured placement for Pt at [facility], but Pt declined, and he left AMA.  Past Psychiatric History:   Risk to Self:   Risk to Others:   Prior Inpatient Therapy:   Prior Outpatient Therapy:    Past Medical History:  Past Medical History:  Diagnosis Date  . Alcohol abuse   . Hypertension   . Pancreatitis      Past Surgical History:  Procedure Laterality Date  . NASAL SEPTUM SURGERY     Family History: History reviewed. No pertinent family history. Family Psychiatric  History:  Social History:  Social History   Substance and Sexual Activity  Alcohol Use Yes  . Alcohol/week: 42.0 standard drinks  . Types: 42 Cans of beer per week   Comment: daily use     Social History   Substance and Sexual Activity  Drug Use Not Currently  . Types: Cocaine   Comment: stopped 01/2019    Social History   Socioeconomic History  . Marital status: Single    Spouse name: Not on file  . Number of children: Not on file  . Years of education: Not on file  . Highest education level: Not on file  Occupational History  . Not on file  Tobacco Use  . Smoking status: Current Every Day Smoker    Packs/day: 0.25    Types: Cigarettes  . Smokeless tobacco: Never Used  Vaping Use  . Vaping Use: Never used  Substance and Sexual Activity  . Alcohol use: Yes    Alcohol/week: 42.0 standard drinks    Types: 42 Cans of beer per week    Comment: daily use  . Drug use: Not Currently    Types: Cocaine    Comment: stopped 01/2019  . Sexual activity: Not on file  Other Topics Concern  . Not on file  Social History Narrative  . Not on file   Social Determinants of Health   Financial  Resource Strain: Not on file  Food Insecurity: Not on file  Transportation Needs: No Transportation Needs  . Lack of Transportation (Medical): No  . Lack of Transportation (Non-Medical): No  Physical Activity: Not on file  Stress: Not on file  Social Connections: Not on file   Additional Social History:    Allergies:   Allergies  Allergen Reactions  . Caffeine Palpitations, Anaphylaxis and Other (See Comments)    Other reaction(s): Heart rhythm/rate changes Heart beat super fast and bean enzymes    . Bean Pod Extract Palpitations    Labs:  Results for orders placed or performed during the hospital encounter of  07/15/20 (from the past 48 hour(s))  Rapid urine drug screen (hospital performed)     Status: None   Collection Time: 07/15/20  1:00 PM  Result Value Ref Range   Opiates NONE DETECTED NONE DETECTED   Cocaine NONE DETECTED NONE DETECTED   Benzodiazepines NONE DETECTED NONE DETECTED   Amphetamines NONE DETECTED NONE DETECTED   Tetrahydrocannabinol NONE DETECTED NONE DETECTED   Barbiturates NONE DETECTED NONE DETECTED    Comment: (NOTE) DRUG SCREEN FOR MEDICAL PURPOSES ONLY.  IF CONFIRMATION IS NEEDED FOR ANY PURPOSE, NOTIFY LAB WITHIN 5 DAYS.  LOWEST DETECTABLE LIMITS FOR URINE DRUG SCREEN Drug Class                     Cutoff (ng/mL) Amphetamine and metabolites    1000 Barbiturate and metabolites    200 Benzodiazepine                 200 Tricyclics and metabolites     300 Opiates and metabolites        300 Cocaine and metabolites        300 THC                            50 Performed at [facility], [address], [place], [place] [address]   CBC with Differential     Status: Abnormal   Collection Time: 07/15/20  2:00 PM  Result Value Ref Range   WBC 3.3 (L) 4.0 - 10.5 K/uL   RBC 4.30 4.22 - 5.81 MIL/uL   Hemoglobin 15.9 13.0 - 17.0 g/dL   HCT 48.5 46.2 - 70.3 %   MCV 103.3 (H) 80.0 - 100.0 fL   MCH 37.0 (H) 26.0 - 34.0 pg   MCHC 35.8 30.0 - 36.0 g/dL   RDW 50.0 93.8 - 18.2 %   Platelets 185 150 - 400 K/uL   nRBC 0.0 0.0 - 0.2 %   Neutrophils Relative % 22 %   Neutro Abs 0.7 (L) 1.7 - 7.7 K/uL   Lymphocytes Relative 61 %   Lymphs Abs 2.1 0.7 - 4.0 K/uL   Monocytes Relative 14 %   Monocytes Absolute 0.5 0.1 - 1.0 K/uL   Eosinophils Relative 2 %   Eosinophils Absolute 0.1 0.0 - 0.5 K/uL   Basophils Relative 1 %   Basophils Absolute 0.0 0.0 - 0.1 K/uL   Immature Granulocytes 0 %   Abs Immature Granulocytes 0.01 0.00 - 0.07 K/uL    Comment: Performed at [facility], [address], [place], [place] [address]  Ethanol      Status: Abnormal   Collection Time: 07/15/20  2:00 PM  Result Value Ref Range   Alcohol, Ethyl (B) 226 (H) <10 mg/dL    Comment: (NOTE) Lowest  detectable limit for serum alcohol is 10 mg/dL.  For medical purposes only. Performed at [facility], [address], [place], [place] [address]   Acetaminophen level     Status: None   Collection Time: 07/15/20  2:00 PM  Result Value Ref Range   Acetaminophen (Tylenol), Serum RESULTS UNAVAILABLE DUE TO INTERFERING SUBSTANCE 10 - 30 ug/mL    Comment: NOTIFIED JOHNSON,L. RN @1550  ON 02.02.2022 BY COHEN,K Performed at [facility], [address] [place], [place]   Salicylate level     Status: Abnormal   Collection Time: 07/15/20  2:00 PM  Result Value Ref Range   Salicylate Lvl <7.0 (L) 7.0 - 30.0 mg/dL    Comment: Performed at [facility], [address], [place], [place]  SARS Coronavirus 2 by RT PCR (hospital order, performed in [facility] hospital lab) Nasopharyngeal Nasopharyngeal Swab     Status: Abnormal   Collection Time: 07/15/20  2:00 PM   Specimen: Nasopharyngeal Swab  Result Value Ref Range   SARS Coronavirus 2 POSITIVE (A) NEGATIVE    Comment: RESULT CALLED TO, READ BACK BY AND VERIFIED WITH: GROVES,R. RN @1519  ON 02.02.2022 BY COHEN,K (NOTE) SARS-CoV-2 target nucleic acids are DETECTED  SARS-CoV-2 RNA is generally detectable in upper respiratory specimens  during the acute phase of infection.  Positive results are indicative  of the presence of the identified virus, but do not rule out bacterial infection or co-infection with other pathogens not detected by the test.  Clinical correlation with patient history and  other diagnostic information is necessary to determine patient infection status.  The expected result is negative.  Fact Sheet for Patients:     Fact Sheet for Healthcare Providers:    04.02.2022    This test is not yet approved or cleared by the [employer] FDA and  has been authorized for detection and/or diagnosis of SARS-CoV-2 by FDA under an Emergency Use Authorization (EUA).  This EUA will remain in effect (meanin g this test can be used) for the duration of  the COVID-19 declaration under Section 564(b)(1) of the Act, 21 U.S.C. section 360-bbb-3(b)(1), unless the authorization is terminated or revoked sooner.  Performed at [facility], [address] [place], [place]   Comprehensive metabolic panel     Status: Abnormal   Collection Time: 07/15/20  2:48 PM  Result Value Ref Range   Sodium 140 135 - 145 mmol/L   Potassium 5.1 3.5 - 5.1 mmol/L    Comment: SLIGHT HEMOLYSIS POST-ULTRACENTRIFUGATION    Chloride 103 98 - 111 mmol/L   CO2 19 (L) 22 - 32 mmol/L   Glucose, Bld 134 (H) 70 - 99 mg/dL    Comment: Glucose reference range applies only to samples taken after fasting for at least 8 hours.   BUN <5 (L) 6 - 20 mg/dL   Creatinine, Ser 41740 0.61 - 1.24 mg/dL   Calcium 9.0 8.9 - 09/12/20 mg/dL   Total Protein 6.7 6.5 - 8.1 g/dL   Albumin 4.2 3.5 - 5.0 g/dL   AST 8.14 (H) 15 - 41 U/L   ALT 39 0 - 44 U/L   Alkaline Phosphatase 81 38 - 126 U/L   Total Bilirubin 1.6 (H) 0.3 - 1.2 mg/dL   GFR, Estimated 48.1 856 mL/min    Comment: (NOTE) Calculated using the CKD-EPI Creatinine Equation (2021)    Anion gap 18 (H) 5 - 15  Comment: Performed at [facility], [address], [place], [place] [address]    Medications:  Current Facility-Administered Medications  Medication Dose Route Frequency Provider Last Rate Last Admin  . LORazepam (ATIVAN) injection 0-4 mg  0-4 mg Intravenous Q6H Hong, Eustace Moore, MD       Or  . LORazepam (ATIVAN) tablet 0-4 mg  0-4 mg Oral Q6H Cheryll Cockayne, MD   2 mg at 07/16/20 0749  . [START ON 07/17/2020] LORazepam (ATIVAN) injection 0-4 mg  0-4 mg Intravenous Q12H  Cheryll Cockayne, MD       Or  . Melene Muller ON 07/17/2020] LORazepam (ATIVAN) tablet 0-4 mg  0-4 mg Oral Q12H Hong, Eustace Moore, MD      . thiamine tablet 100 mg  100 mg Oral Daily Cheryll Cockayne, MD   100 mg at 07/16/20 5102   Or  . thiamine (B-1) injection 100 mg  100 mg Intravenous Daily Hong, Eustace Moore, MD       Current Outpatient Medications  Medication Sig Dispense Refill  . atenolol (TENORMIN) 25 MG tablet Take 1 tablet (25 mg total) by mouth daily. 30 tablet 0  . fenofibrate 160 MG tablet Take 1 tablet (160 mg total) by mouth daily. 30 tablet 0  . Multiple Vitamin (DAILY VITE) TABS Take 1 tablet by mouth once a week.    Marland Kitchen omeprazole (PRILOSEC) 20 MG capsule Take 1 capsule (20 mg total) by mouth daily. 30 capsule 0  . sucralfate (CARAFATE) 1 g tablet Take 1 g by mouth 4 (four) times daily.    Marland Kitchen VITAMINS B1 B6 B12 PO Take 1 tablet by mouth once a week.    . Calcium Carb-Cholecalciferol (CALCIUM 1000 + D) 1000-800 MG-UNIT TABS Take 1 tablet by mouth once a week.    . chlordiazePOXIDE (LIBRIUM) 25 MG capsule 50mg  PO TID x 1D, then 25-50mg  PO BID X 1D, then 25-50mg  PO QD X 1D (Patient not taking: No sig reported) 10 capsule 0  . sertraline (ZOLOFT) 25 MG tablet Take 2 tablets (50 mg total) by mouth daily. 60 tablet 0    Musculoskeletal: Strength & Muscle Tone: within normal limits Gait & Station: normal Patient leans: N/A  Psychiatric Specialty Exam: Physical Exam  Review of Systems  Blood pressure (!) 164/105, pulse (!) 102, temperature (!) 97.4 F (36.3 C), temperature source Oral, resp. rate 18, SpO2 90 %.There is no height or weight on file to calculate BMI.  General Appearance: Disheveled  Eye Contact:  Good  Speech:  Clear and Coherent  Volume:  Normal  Mood:  Anxious and Depressed  Affect:  Congruent  Thought Process:  Coherent  Orientation:  Full (Time, Place, and Person)  Thought Content:  Logical  Suicidal Thoughts:  No  Homicidal Thoughts:  No  Memory:  Immediate;    Fair Recent;   Fair  Judgement:  Fair  Insight:  Fair  Psychomotor Activity:  Normal  Concentration:  Concentration: Fair  Recall:  [employer] of Knowledge:  Fair  Language:  Fair  Akathisia:  No  Handed:  Right  AIMS (if indicated):     Assets:  Communication Skills Desire for Improvement Resilience Social Support  ADL's:  Intact  Cognition:  WNL  Sleep:       Disposition: No evidence of imminent risk to self or others at present.   Patient does not meet criteria for psychiatric inpatient admission. Refer to IOP. Discussed crisis plan, support from social  network, calling 911, coming to the Emergency Department, and calling Suicide Hotline.   Orders placed for Peer Support   This service was provided via telemedicine using a 2-way, interactive audio and video technology.  Names of all persons participating in this telemedicine service and their role in this encounter. Name: Levora DredgeJeffery Plotts Role: patient   Name:T .Jordana Dugue Role: NP           Oneta Rackanika N Shatavia Santor, NP 07/16/2020 11:08 AM

## 2020-07-16 NOTE — Discharge Instructions (Signed)
To help you maintain a sober lifestyle, a substance abuse treatment program may be beneficial to you.  Contact one of the following facilities at your earliest opportunity to ask about enrolling:  RESIDENTIAL PROGRAMS:       [facility]      [address]      [place], [place] [address]      [phone]       [facility]      [address], [place] [address]      [phone]       [facility]      [address]      [place], [place] [address]      [phone]  CHEMICAL DEPENDENCY INTENSIVE OUTPATIENT PROGRAMS:        [facility]      [address] [facility] [address]      [place], [place] [address]      [phone]       [facility]      [address] [place], [place] [address]      [phone]

## 2020-07-16 NOTE — Telephone Encounter (Signed)
Called to discuss with patient about COVID-19 symptoms and the use of one of the available treatments for those with mild to moderate Covid symptoms and at a high risk of hospitalization.  Pt appears to qualify for outpatient treatment due to co-morbid conditions and/or a member of an at-risk group in accordance with the FDA Emergency Use Authorization.    Symptom onset:  Vaccinated:  Booster?  Immunocompromised? Qualifiers:  Patient is currently in [facility] ED waiting for medical clearance and behavioral team evaluation.   Nicolas [name]

## 2020-07-18 IMAGING — CT CT ABD-PELV W/ CM
2 of 5 series · 15 of 46 positions shown, 17 images · IV contrast (Omni 300)
Comparison: None.

CLINICAL DATA: Severe epigastric pain.

EXAM:
CT ABDOMEN AND PELVIS WITH CONTRAST
TECHNIQUE: Multidetector CT imaging of the abdomen and pelvis was performed
using the standard protocol following bolus administration of
intravenous contrast.
CONTRAST:  100mL OMNIPAQUE IOHEXOL 300 MG/ML  SOLN

[Series 3: a/p w/ 5mm · axial · 0.77mm/px · z∈[+805,+1290]mm · 12 of 109 slices shown, 14 images]
[im 6/109  soft-tissue]
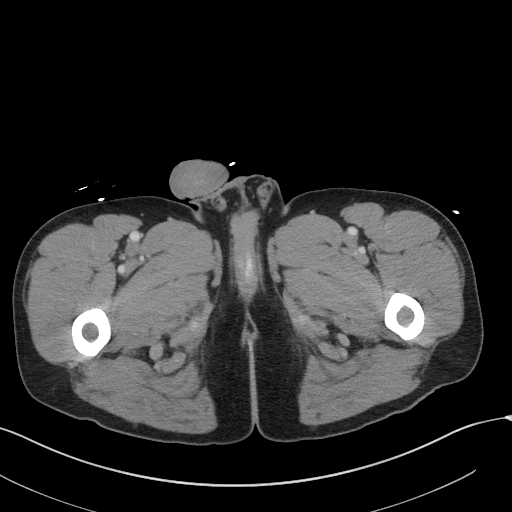
[im 6/109  bone]
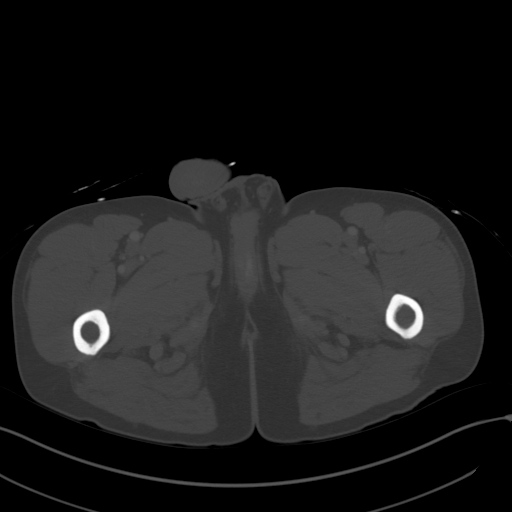
[im 18/109  soft-tissue]
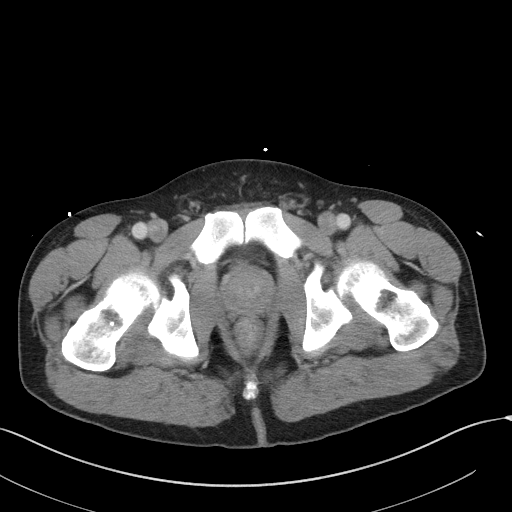
[im 23/109  soft-tissue]
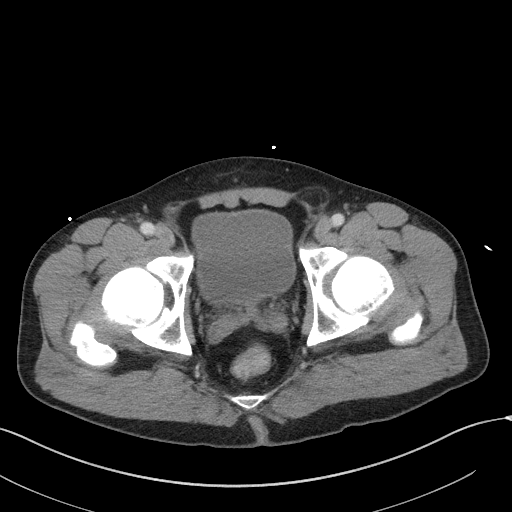
[im 35/109  soft-tissue]
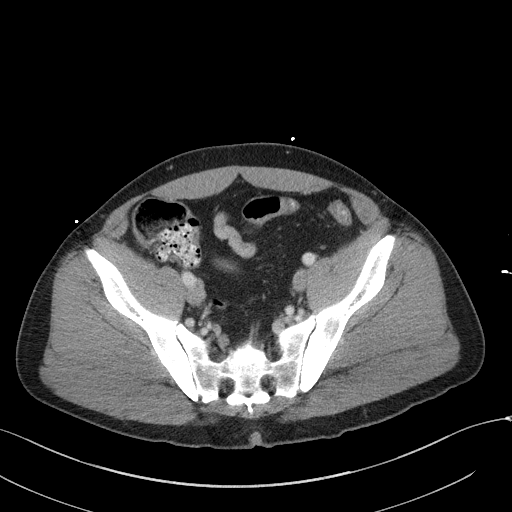
[im 40/109  soft-tissue]
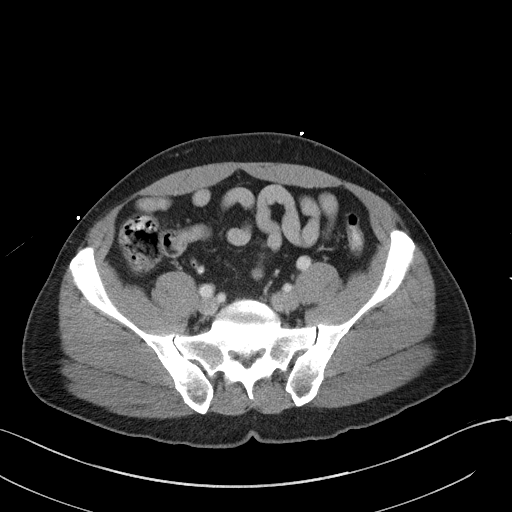
[im 52/109  soft-tissue]
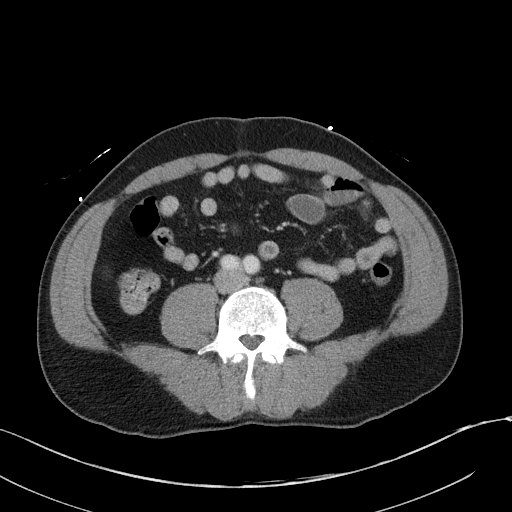
[im 57/109  soft-tissue]
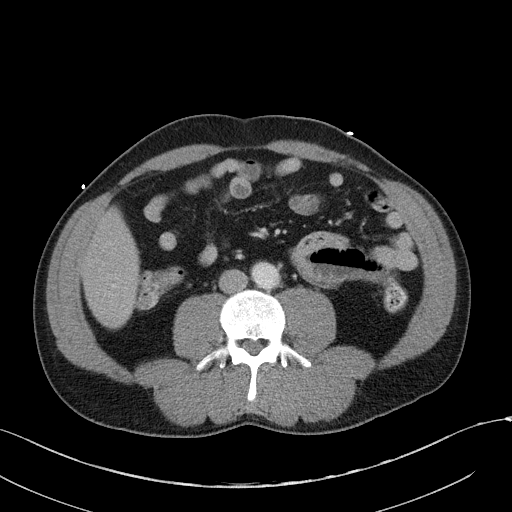
[im 69/109  soft-tissue]
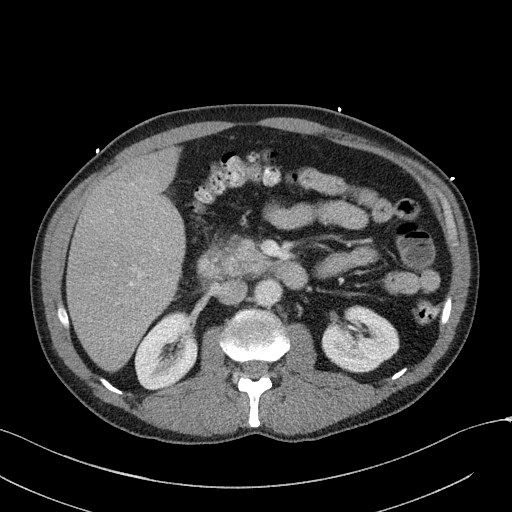
[im 74/109  soft-tissue]
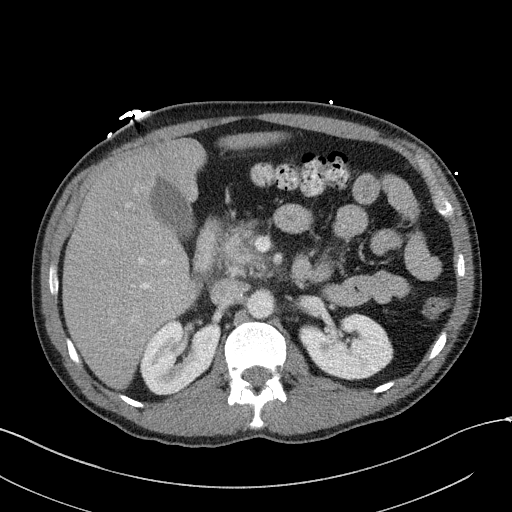
[im 74/109  bone]
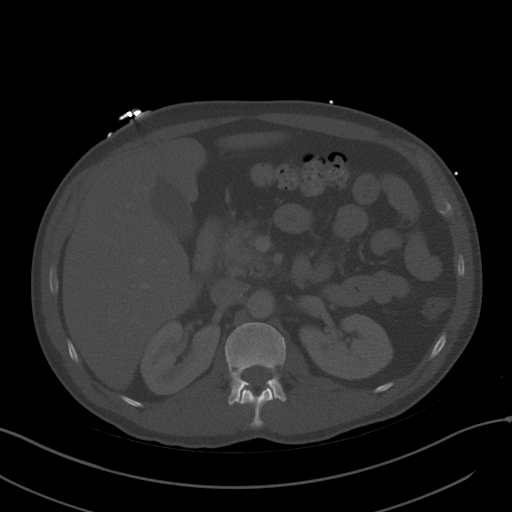
[im 86/109  soft-tissue]
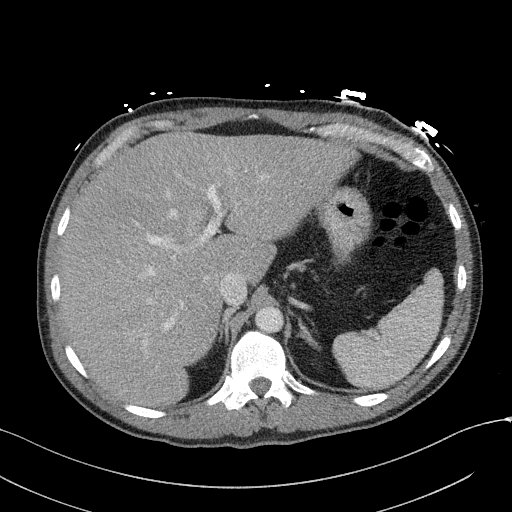
[im 91/109  soft-tissue]
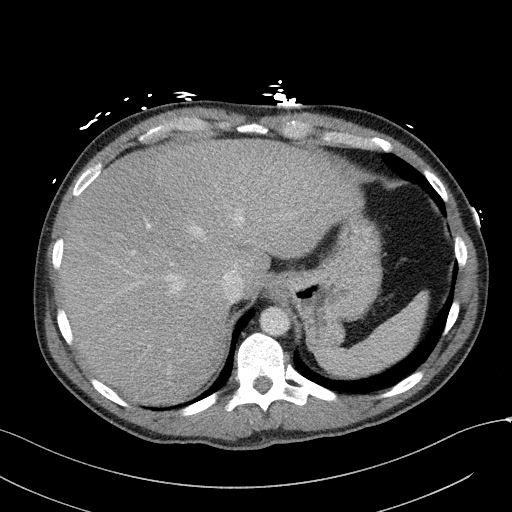
[im 103/109  soft-tissue]
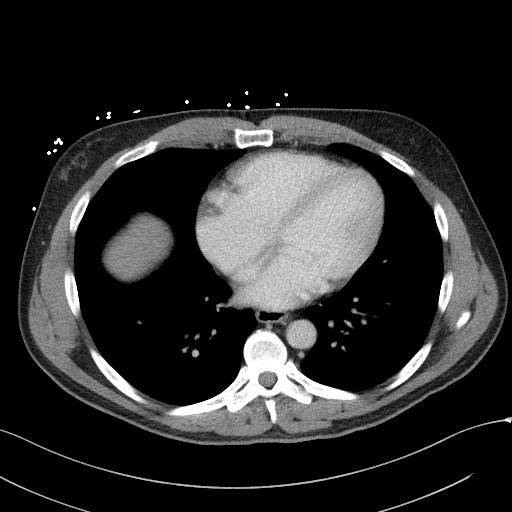

[Series 6: a/p w/ cor · coronal · 0.73mm/px · 3 of 151 slices shown]
[im 51/151  soft-tissue]
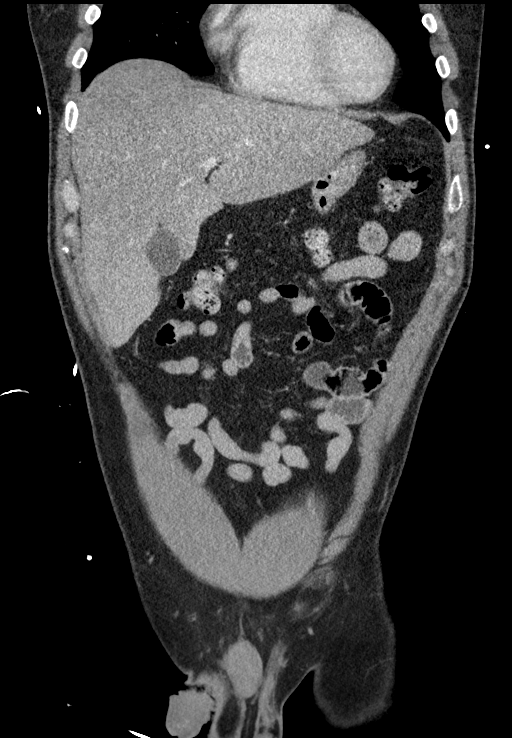
[im 67/151  soft-tissue]
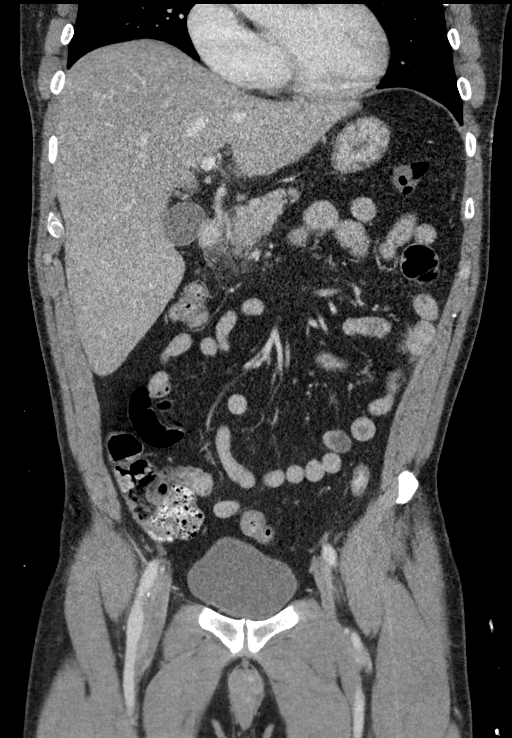
[im 84/151  soft-tissue]
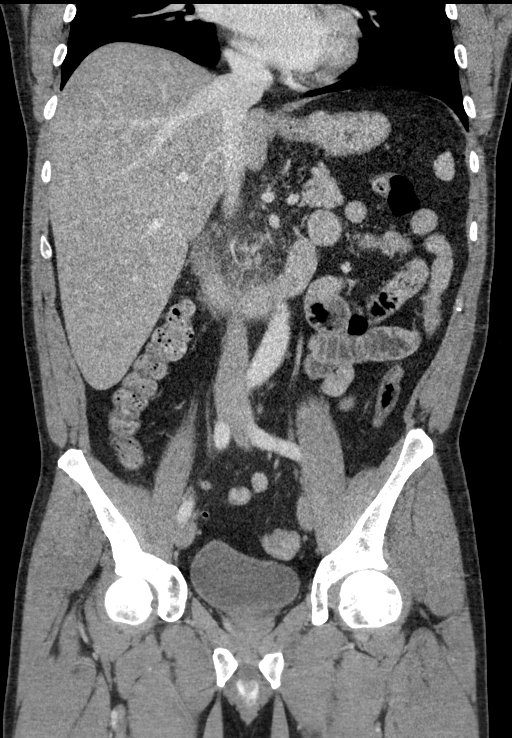

[15 of 46 positions shown; findings below may reference images not displayed]

FINDINGS: Lower chest: No acute abnormality.

Hepatobiliary: No focal liver abnormality is seen. No gallstones,
gallbladder wall thickening, or biliary dilatation.

Pancreas: Pancreatic and peripancreatic edema in the head and
uncinate process of the pancreas. The body and tail of the pancreas
have normal appearance. 1.5 cm cystic structure within the head of
the pancreas peripherally may represent a pseudocyst or cystic
pancreatic neoplasm.

Spleen: Normal in size without focal abnormality.

Adrenals/Urinary Tract: Adrenal glands are unremarkable. Kidneys are
normal, without renal calculi, focal lesion, or hydronephrosis.
Bladder is unremarkable.

Stomach/Bowel: Normal appearance of the stomach. Inflammatory
changes of the second and third portions of the duodenum, likely
reactive. No evidence of small-bowel obstruction. Normal appearance
of the colon and appendix.

Vascular/Lymphatic: No significant vascular findings are present. No
enlarged abdominal or pelvic lymph nodes. Shotty peripancreatic
lymph nodes, not pathologic by CT criteria.

Reproductive: Prostate is unremarkable.

Other: No abdominal wall hernia or abnormality. No abdominopelvic
ascites.

Musculoskeletal: No acute or significant osseous findings.
IMPRESSION: 1. Pancreatic and peripancreatic edema of the head and uncinate
process of the pancreas. The findings are most consistent with acute
pancreatitis.
2. 1.5 cm cystic structure within the head of the pancreas
peripherally may represent a small pseudocyst or cystic pancreatic
neoplasm. Re-evaluation upon resolution of the acute symptoms is
recommended.
3. Inflammatory changes of the second and third portions of the
duodenum, likely reactive.

## 2020-07-20 NOTE — ED Provider Notes (Signed)
[facility]-EMERGENCY DEPT Provider Note   CSN: [id number] Arrival date & time: 07/08/20  1737     History Chief Complaint  Patient presents with  . IVC    Nicolas [name] is a 55 y.o. adult.on IVC stating wants to kill self by alcohol. Hx of same.   The history is provided by the patient and the police.  Mental Health Problem Presenting symptoms: suicidal threats   Patient accompanied by:  Law enforcement Degree of incapacity (severity):  Moderate Timing:  Constant Progression:  Unchanged Chronicity:  Recurrent Context: alcohol use   Treatment compliance:  Untreated Relieved by:  Nothing Worsened by:  Alcohol Ineffective treatments:  None tried Associated symptoms: feelings of worthlessness   Associated symptoms: no abdominal pain, no chest pain and no headaches   Risk factors: hx of mental illness        Past Medical History:  Diagnosis Date  . Alcohol abuse   . Hypertension   . Pancreatitis     Patient Active Problem List   Diagnosis Date Noted  . Alcohol abuse   . Pancreatitis 05/10/2020  . Acute alcoholic pancreatitis 05/09/2020  . GERD (gastroesophageal reflux disease) 11/03/2019  . Alcoholic hepatitis 11/02/2019  . Major depressive disorder, recurrent episode, severe (HCC) 10/21/2019  . 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 Acute pancreatitis 10/20/2019  . Alcohol use disorder, severe, dependence (HCC) 10/16/2019    Past Surgical History:  Procedure Laterality Date  . NASAL SEPTUM SURGERY         No family history on file.  Social History   Tobacco Use  . Smoking status: Current Every Day Smoker    Packs/day: 0.25    Types: Cigarettes  . Smokeless tobacco: Never Used  Vaping Use  . Vaping Use: Never used  Substance Use Topics  . Alcohol use: Yes    Alcohol/week: 42.0 standard drinks    Types: 42 Cans of beer per week    Comment: daily use  . Drug use: Not Currently    Types: Cocaine    Comment: stopped 01/2019     Home Medications Prior to Admission medications   Medication Sig Start Date End Date Taking? Authorizing Provider  atenolol (TENORMIN) 25 MG tablet Take 1 tablet (25 mg total) by mouth daily. 06/17/20  Yes Money, Gerlene Burdock, FNP  Calcium Carb-Cholecalciferol (CALCIUM 1000 + D) 1000-800 MG-UNIT TABS Take 1 tablet by mouth once a week.   Yes [provider]  fenofibrate 160 MG tablet Take 1 tablet (160 mg total) by mouth daily. 06/17/20  Yes Money, Gerlene Burdock, FNP  Multiple Vitamin (DAILY VITE) TABS Take 1 tablet by mouth once a week.   Yes [provider]  omeprazole (PRILOSEC) 20 MG capsule Take 1 capsule (20 mg total) by mouth daily. 06/17/20  Yes Money, Gerlene Burdock, FNP  sucralfate (CARAFATE) 1 g tablet Take 1 g by mouth 4 (four) times daily.   Yes [provider]  VITAMINS B1 B6 B12 PO Take 1 tablet by mouth once a week.   Yes [provider]  chlordiazePOXIDE (LIBRIUM) 25 MG capsule 50mg  PO TID x 1D, then 25-50mg  PO BID X 1D, then 25-50mg  PO QD X 1D 07/16/20   Long, 09/13/20, MD  ondansetron (ZOFRAN ODT) 4 MG disintegrating tablet Take 1 tablet (4 mg total) by mouth every 8 (eight) hours as needed for nausea or vomiting. 07/16/20   Long, 09/13/20, MD  sertraline (ZOLOFT) 25 MG tablet Take 2 tablets (50 mg  total) by mouth daily. 05/12/20 06/11/20  Zigmund Daniel., MD    Allergies    Caffeine and Bean pod extract  Review of Systems   Review of Systems  Constitutional: Negative for fever.  HENT: Negative for sore throat.   Eyes: Negative for visual disturbance.  Respiratory: Negative for shortness of breath.   Cardiovascular: Negative for chest pain.  Gastrointestinal: Negative for abdominal pain.  Genitourinary: Negative for dysuria.  Musculoskeletal: Negative for neck pain.  Skin: Negative for rash.  Neurological: Negative for headaches.    Physical Exam Updated Vital Signs BP (!) 161/89 (BP Location: Left Arm)   Pulse 89   Temp 98.2 F (36.8 C)  (Oral)   Resp 20   Wt 90.7 kg   SpO2 95%   BMI 28.70 kg/m   Physical Exam Vitals and nursing note reviewed.  Constitutional:      Appearance: Normal appearance. Nicolas [name] is well-developed and well-nourished.  HENT:     Head: Normocephalic and atraumatic.  Eyes:     Conjunctiva/sclera: Conjunctivae normal.  Cardiovascular:     Rate and Rhythm: Normal rate and regular rhythm.     Heart sounds: No murmur heard.   Pulmonary:     Effort: Pulmonary effort is normal. No respiratory distress.     Breath sounds: Normal breath sounds.  Abdominal:     Palpations: Abdomen is soft.     Tenderness: There is no abdominal tenderness.  Musculoskeletal:        General: No deformity, signs of injury or edema. Normal range of motion.     Cervical back: Neck supple.  Skin:    General: Skin is warm and dry.  Neurological:     General: No focal deficit present.     Mental Status: Nicolas [name] is alert.     GCS: GCS eye subscore is 4. GCS verbal subscore is 5. GCS motor subscore is 6.  Psychiatric:        Mood and Affect: Mood and affect normal.        Behavior: Behavior normal.     ED Results / Procedures / Treatments   Labs (all labs ordered are listed, but only abnormal results are displayed) Labs Reviewed  COMPREHENSIVE METABOLIC PANEL - Abnormal; Notable for the following components:      Result Value   Sodium 132 (*)    Chloride 94 (*)    Glucose, Bld 132 (*)    BUN <5 (*)    AST 73 (*)    ALT 60 (*)    All other components within normal limits  ETHANOL - Abnormal; Notable for the following components:   Alcohol, Ethyl (B) 173 (*)    All other components within normal limits  CBC - Abnormal; Notable for the following components:   MCV 100.2 (*)    MCH 34.4 (*)    All other components within normal limits  RAPID URINE DRUG SCREEN, HOSP PERFORMED - Abnormal; Notable for the following components:   Benzodiazepines POSITIVE (*)    All other components within normal  limits  SARS CORONAVIRUS 2 BY RT PCR (HOSPITAL ORDER, PERFORMED IN [facility])    EKG None  Radiology No results found.  Procedures Procedures   Medications Ordered in ED Medications  LORazepam (ATIVAN) tablet 2 mg (2 mg Oral Given 07/08/20 1903)  pantoprazole (PROTONIX) EC tablet 40 mg (40 mg Oral Given 07/08/20 1901)    ED Course  I have reviewed the triage vital  signs and the nursing notes.  Pertinent labs & imaging results that were available during my care of the patient were reviewed by me and considered in my medical decision making (see chart for details).    MDM Rules/Calculators/A&P                         Placed on CIWA.  The patient has been placed in psychiatric observation due to the need to provide a safe environment for the patient while obtaining psychiatric consultation and evaluation, as well as ongoing medical and medication management to treat the patient's condition.  The patient has been placed under full IVC at this time.   Final Clinical Impression(s) / ED Diagnoses Final diagnoses:  Alcohol dependence with uncomplicated intoxication (HCC)  Suicidal ideation    Rx / DC Orders ED Discharge Orders    None       Terrilee Files, MD 07/20/20 1121

## 2020-08-26 ENCOUNTER — Telehealth: Payer: Self-pay | Admitting: *Deleted

## 2020-08-26 NOTE — Patient Outreach (Signed)
[facility]) Care Management  08/26/2020  Beecher Furio 05-15-1966 [id number]   CSW was unable to reach pt today by phone. CSW was able to leave a HIPPA compliant voice message and will await callback or try again in 3-4 business days.   [name], MSW, LCSW Clinical Social Worker  [facility] [employer] [phone]

## 2020-08-31 ENCOUNTER — Ambulatory Visit: Payer: Self-pay | Admitting: *Deleted

## 2020-09-01 ENCOUNTER — Telehealth: Payer: Self-pay | Admitting: *Deleted

## 2020-09-01 NOTE — Patient Outreach (Signed)
[facility]) Care Management  09/01/2020  Janes Colegrove Dec 14, 1965 [id number]   CSW attempted a second outreach follow up call to pt on 08/31/2020 and was unable to reach pt. CSW has left a voice mail message and will await callback. CSW will attempt a 3rd outreach call to pt per policy.   [name], MSW, LCSW Clinical Social Worker  [facility] [employer] [phone]

## 2020-09-07 ENCOUNTER — Ambulatory Visit: Payer: Self-pay | Admitting: *Deleted

## 2020-09-07 ENCOUNTER — Telehealth: Payer: Self-pay | Admitting: *Deleted

## 2020-09-07 NOTE — Patient Outreach (Signed)
[facility]) Care Management  09/07/2020  Devaughn Savant 1966/04/08 [id number]   CSW attempted another phone outreach to pt and was unable to reach pt.  CSW left a HIPPA compliant voice message and will plan to sign off.  CSW will advise PCP and [facility] team of above.   [name], MSW, LCSW Clinical Social Worker  [facility] [employer] [phone]

## 2020-09-11 ENCOUNTER — Encounter (HOSPITAL_COMMUNITY): Payer: Self-pay | Admitting: Emergency Medicine

## 2020-09-11 ENCOUNTER — Emergency Department (HOSPITAL_COMMUNITY)
Admission: EM | Admit: 2020-09-11 | Discharge: 2020-09-12 | Disposition: A | Discharge status: Home / Self Care | Payer: 59 | Attending: Emergency Medicine | Admitting: Emergency Medicine

## 2020-09-11 ENCOUNTER — Other Ambulatory Visit: Payer: Self-pay

## 2020-09-11 DIAGNOSIS — K2920 Alcoholic gastritis without bleeding: Secondary | ICD-10-CM | POA: Insufficient documentation

## 2020-09-11 DIAGNOSIS — Z79899 Other long term (current) drug therapy: Secondary | ICD-10-CM | POA: Diagnosis not present

## 2020-09-11 DIAGNOSIS — Y907 Blood alcohol level of 200-239 mg/100 ml: Secondary | ICD-10-CM | POA: Insufficient documentation

## 2020-09-11 DIAGNOSIS — F10929 Alcohol use, unspecified with intoxication, unspecified: Secondary | ICD-10-CM

## 2020-09-11 DIAGNOSIS — R1013 Epigastric pain: Secondary | ICD-10-CM | POA: Diagnosis present

## 2020-09-11 DIAGNOSIS — F10129 Alcohol abuse with intoxication, unspecified: Secondary | ICD-10-CM | POA: Diagnosis not present

## 2020-09-11 DIAGNOSIS — F17210 Nicotine dependence, cigarettes, uncomplicated: Secondary | ICD-10-CM | POA: Diagnosis not present

## 2020-09-11 DIAGNOSIS — I10 Essential (primary) hypertension: Secondary | ICD-10-CM | POA: Insufficient documentation

## 2020-09-11 NOTE — ED Triage Notes (Signed)
Patient is complaining of abdominal pain and has been drinking for a day.

## 2020-09-12 LAB — COMPREHENSIVE METABOLIC PANEL
ALT: 54 U/L — ABNORMAL HIGH (ref 0–44)
AST: 37 U/L (ref 15–41)
Albumin: 4.3 g/dL (ref 3.5–5.0)
Alkaline Phosphatase: 52 U/L (ref 38–126)
Anion gap: 11 (ref 5–15)
BUN: 8 mg/dL (ref 6–20)
CO2: 23 mmol/L (ref 22–32)
Calcium: 9.4 mg/dL (ref 8.9–10.3)
Chloride: 108 mmol/L (ref 98–111)
Creatinine, Ser: 0.67 mg/dL (ref 0.61–1.24)
GFR, Estimated: >60 mL/min (ref >60)
Glucose, Bld: 135 mg/dL — ABNORMAL HIGH (ref 70–99)
Potassium: 3.2 mmol/L — ABNORMAL LOW (ref 3.5–5.1)
Sodium: 142 mmol/L (ref 135–145)
Total Bilirubin: 0.4 mg/dL (ref 0.3–1.2)
Total Protein: 6.8 g/dL (ref 6.5–8.1)

## 2020-09-12 LAB — CBC WITH DIFFERENTIAL/PLATELET
Abs Immature Granulocytes: 0.06 10*3/uL (ref 0.00–0.07)
Basophils Absolute: 0.1 10*3/uL (ref 0.0–0.1)
Basophils Relative: 1 %
Eosinophils Absolute: 0.1 10*3/uL (ref 0.0–0.5)
Eosinophils Relative: 2 %
HCT: 39.8 % (ref 39.0–52.0)
Hemoglobin: 13.8 g/dL (ref 13.0–17.0)
Immature Granulocytes: 1 %
Lymphocytes Relative: 44 %
Lymphs Abs: 2.7 10*3/uL (ref 0.7–4.0)
MCH: 33.9 pg (ref 26.0–34.0)
MCHC: 34.7 g/dL (ref 30.0–36.0)
MCV: 97.8 fL (ref 80.0–100.0)
Monocytes Absolute: 0.6 10*3/uL (ref 0.1–1.0)
Monocytes Relative: 9 %
Neutro Abs: 2.7 10*3/uL (ref 1.7–7.7)
Neutrophils Relative %: 43 %
Platelets: 255 10*3/uL (ref 150–400)
RBC: 4.07 MIL/uL — ABNORMAL LOW (ref 4.22–5.81)
RDW: 11.5 % (ref 11.5–15.5)
WBC: 6.1 10*3/uL (ref 4.0–10.5)
nRBC: 0 % (ref 0.0–0.2)

## 2020-09-12 LAB — ETHANOL: Alcohol, Ethyl (B): 217 mg/dL — ABNORMAL HIGH (ref <10)

## 2020-09-12 LAB — URINALYSIS, ROUTINE W REFLEX MICROSCOPIC
Bilirubin Urine: NEGATIVE
Glucose, UA: NEGATIVE mg/dL
Hgb urine dipstick: NEGATIVE
Ketones, ur: NEGATIVE mg/dL
Leukocytes,Ua: NEGATIVE
Nitrite: NEGATIVE
Protein, ur: NEGATIVE mg/dL
Specific Gravity, Urine: 1.008 (ref 1.005–1.030)
pH: 5 (ref 5.0–8.0)

## 2020-09-12 LAB — LIPASE, BLOOD: Lipase: 25 U/L (ref 11–51)

## 2020-09-12 MED ORDER — POTASSIUM CHLORIDE CRYS ER 20 MEQ PO TBCR
40.0000 meq | EXTENDED_RELEASE_TABLET | Freq: Once | ORAL | Status: AC
  Administered 2020-09-12: 40 meq via ORAL
  Filled 2020-09-12: qty 2

## 2020-09-12 MED ORDER — SODIUM CHLORIDE 0.9 % IV BOLUS
1000.0000 mL | Freq: Once | INTRAVENOUS | Status: AC
  Administered 2020-09-12: 1000 mL via INTRAVENOUS

## 2020-09-12 MED ORDER — PANTOPRAZOLE SODIUM 40 MG IV SOLR
40.0000 mg | Freq: Once | INTRAVENOUS | Status: AC
  Administered 2020-09-12: 40 mg via INTRAVENOUS
  Filled 2020-09-12: qty 40

## 2020-09-12 MED ORDER — PANTOPRAZOLE SODIUM 20 MG PO TBEC: DELAYED_RELEASE_TABLET | ORAL | 0 refills | Status: AC

## 2020-09-12 MED ORDER — SUCRALFATE 1 G PO TABS: 1.0000 g | ORAL_TABLET | Freq: Three times a day (TID) | ORAL | 1 refills | Status: AC

## 2020-09-12 MED ORDER — SUCRALFATE 1 GM/10ML PO SUSP
1.0000 g | Freq: Once | ORAL | Status: AC
  Administered 2020-09-12: 1 g via ORAL
  Filled 2020-09-12: qty 10

## 2020-09-12 MED ORDER — LORAZEPAM 1 MG PO TABS: 0.0000 mg | ORAL_TABLET | Freq: Four times a day (QID) | ORAL | Status: DC

## 2020-09-12 MED ORDER — ONDANSETRON HCL 4 MG/2ML IJ SOLN
4.0000 mg | Freq: Once | INTRAMUSCULAR | Status: AC
  Administered 2020-09-12: 4 mg via INTRAVENOUS
  Filled 2020-09-12: qty 2

## 2020-09-12 MED ORDER — LORAZEPAM 2 MG/ML IJ SOLN
0.0000 mg | Freq: Four times a day (QID) | INTRAMUSCULAR | Status: DC
  Administered 2020-09-12: 1 mg via INTRAVENOUS
  Filled 2020-09-12: qty 1

## 2020-09-12 MED ORDER — ONDANSETRON 8 MG PO TBDP: 8.0000 mg | ORAL_TABLET | Freq: Three times a day (TID) | ORAL | 0 refills | Status: AC | PRN

## 2020-09-12 NOTE — ED Provider Notes (Signed)
[facility]-EMERGENCY DEPT Provider Note: [name], MD, FACEP  CSN: [id number] MRN: [id number] ARRIVAL: 09/11/20 at 2326 ROOM: WA03/WA03   CHIEF COMPLAINT  Abdominal Pain   HISTORY OF PRESENT ILLNESS  09/12/20 12:17 AM Lenus Trauger is a 55 y.o. adult with a history of alcoholism.  He was sober for 52 days after rehab.  3 days ago he began drinking again.  He states this started with "just a beer" but unfortunately began drinking more heavily.  He is here with abdominal pain that began yesterday.  He localizes it to the epigastrium, rates it as a 10 out of 10, and characterizes it as like previous pancreatitis but not as severe.  It is worse with palpation or movement.  He is also nauseated.    Past Medical History:  Diagnosis Date  . Alcohol abuse   . Hypertension   . Pancreatitis     Past Surgical History:  Procedure Laterality Date  . NASAL SEPTUM SURGERY      History reviewed. No pertinent family history.  Social History   Tobacco Use  . Smoking status: Current Every Day Smoker    Packs/day: 0.25    Types: Cigarettes  . Smokeless tobacco: Never Used  Vaping Use  . Vaping Use: Never used  Substance Use Topics  . Alcohol use: Yes    Alcohol/week: 42.0 standard drinks    Types: 42 Cans of beer per week    Comment: daily use  . Drug use: Not Currently    Types: Cocaine    Comment: stopped 01/2019    Prior to Admission medications   Medication Sig Start Date End Date Taking? Authorizing Provider  ondansetron (ZOFRAN ODT) 8 MG disintegrating tablet Take 1 tablet (8 mg total) by mouth every 8 (eight) hours as needed for nausea or vomiting. 09/12/20  Yes Reyna Lorenzi, MD  pantoprazole (PROTONIX) 20 MG tablet Take 1 tablet every morning at least 30 minutes before first dose of Carafate. 09/12/20  Yes Kenli Waldo, MD  sucralfate (CARAFATE) 1 g tablet Take 1 tablet (1 g total) by mouth 4 (four) times daily -  with meals and at bedtime. 09/12/20  Yes Naryah Clenney, MD  atenolol  (TENORMIN) 25 MG tablet Take 1 tablet (25 mg total) by mouth daily. 06/17/20   Money, Gerlene Burdock, FNP  Calcium Carb-Cholecalciferol (CALCIUM 1000 + D) 1000-800 MG-UNIT TABS Take 1 tablet by mouth once a week.    [provider]  fenofibrate 160 MG tablet Take 1 tablet (160 mg total) by mouth daily. 06/17/20   Money, Gerlene Burdock, FNP  Multiple Vitamin (DAILY VITE) TABS Take 1 tablet by mouth once a week.    [provider]  sertraline (ZOLOFT) 25 MG tablet Take 2 tablets (50 mg total) by mouth daily. 05/12/20 06/11/20  Zigmund Daniel., MD  VITAMINS B1 B6 B12 PO Take 1 tablet by mouth once a week.    [provider]  omeprazole (PRILOSEC) 20 MG capsule Take 1 capsule (20 mg total) by mouth daily. 06/17/20 09/12/20  Money, Gerlene Burdock, FNP    Allergies Caffeine and Bean pod extract   REVIEW OF SYSTEMS  Negative except as noted here or in the History of Present Illness.   PHYSICAL EXAMINATION  Initial Vital Signs Blood pressure 127/85, pulse 95, temperature 98.4 F (36.9 C), temperature source Oral, resp. rate 18, height 5\' 10"  (1.778 m), weight 90.7 kg, SpO2 97 %.  Examination General: Well-developed, well-nourished adult in no acute distress;  appearance consistent with age of record HENT: normocephalic; atraumatic Eyes: pupils equal, round and reactive to light; extraocular muscles intact Neck: supple Heart: regular rate and rhythm Lungs: clear to auscultation bilaterally Abdomen: soft; nondistended; epigastric tenderness; bowel sounds present Extremities: No deformity; full range of motion; pulses normal Neurologic: Awake, alert and oriented; motor function intact in all extremities and symmetric; no facial droop Skin: Warm and dry Psychiatric: Emotionally labile   RESULTS  Summary of this visit's results, reviewed and interpreted by myself:   EKG Interpretation  Date/Time:    Ventricular Rate:    PR Interval:    QRS Duration:   QT Interval:    QTC  Calculation:   R Axis:     Text Interpretation:        Laboratory Studies: Results for orders placed or performed during the hospital encounter of 09/11/20 (from the past 24 hour(s))  Lipase, blood     Status: None   Collection Time: 09/11/20 11:32 PM  Result Value Ref Range   Lipase 25 11 - 51 U/L  Comprehensive metabolic panel     Status: Abnormal   Collection Time: 09/11/20 11:32 PM  Result Value Ref Range   Sodium 142 135 - 145 mmol/L   Potassium 3.2 (L) 3.5 - 5.1 mmol/L   Chloride 108 98 - 111 mmol/L   CO2 23 22 - 32 mmol/L   Glucose, Bld 135 (H) 70 - 99 mg/dL   BUN 8 6 - 20 mg/dL   Creatinine, Ser 7.67 0.61 - 1.24 mg/dL   Calcium 9.4 8.9 - 20.9 mg/dL   Total Protein 6.8 6.5 - 8.1 g/dL   Albumin 4.3 3.5 - 5.0 g/dL   AST 37 15 - 41 U/L   ALT 54 (H) 0 - 44 U/L   Alkaline Phosphatase 52 38 - 126 U/L   Total Bilirubin 0.4 0.3 - 1.2 mg/dL   GFR, Estimated >47 >09 mL/min   Anion gap 11 5 - 15  Urinalysis, Routine w reflex microscopic     Status: Abnormal   Collection Time: 09/11/20 11:32 PM  Result Value Ref Range   Color, Urine STRAW (A) YELLOW   APPearance CLEAR CLEAR   Specific Gravity, Urine 1.008 1.005 - 1.030   pH 5.0 5.0 - 8.0   Glucose, UA NEGATIVE NEGATIVE mg/dL   Hgb urine dipstick NEGATIVE NEGATIVE   Bilirubin Urine NEGATIVE NEGATIVE   Ketones, ur NEGATIVE NEGATIVE mg/dL   Protein, ur NEGATIVE NEGATIVE mg/dL   Nitrite NEGATIVE NEGATIVE   Leukocytes,Ua NEGATIVE NEGATIVE  CBC with Differential/Platelet     Status: Abnormal   Collection Time: 09/12/20 12:07 AM  Result Value Ref Range   WBC 6.1 4.0 - 10.5 K/uL   RBC 4.07 (L) 4.22 - 5.81 MIL/uL   Hemoglobin 13.8 13.0 - 17.0 g/dL   HCT 62.8 36.6 - 29.4 %   MCV 97.8 80.0 - 100.0 fL   MCH 33.9 26.0 - 34.0 pg   MCHC 34.7 30.0 - 36.0 g/dL   RDW 76.5 46.5 - 03.5 %   Platelets 255 150 - 400 K/uL   nRBC 0.0 0.0 - 0.2 %   Neutrophils Relative % 43 %   Neutro Abs 2.7 1.7 - 7.7 K/uL   Lymphocytes Relative 44 %    Lymphs Abs 2.7 0.7 - 4.0 K/uL   Monocytes Relative 9 %   Monocytes Absolute 0.6 0.1 - 1.0 K/uL   Eosinophils Relative 2 %   Eosinophils Absolute 0.1 0.0 - 0.5 K/uL  Basophils Relative 1 %   Basophils Absolute 0.1 0.0 - 0.1 K/uL   Immature Granulocytes 1 %   Abs Immature Granulocytes 0.06 0.00 - 0.07 K/uL  Ethanol     Status: Abnormal   Collection Time: 09/12/20 12:07 AM  Result Value Ref Range   Alcohol, Ethyl (B) 217 (H) <10 mg/dL   Imaging Studies: No results found.  ED COURSE and MDM  Nursing notes, initial and subsequent vitals signs, including pulse oximetry, reviewed and interpreted by myself.  Vitals:   09/11/20 2334 09/12/20 0030 09/12/20 0100 09/12/20 0245  BP:  136/90 113/70 129/83  Pulse:  74 79 83  Resp:  16 16 18   Temp:      TempSrc:      SpO2:  92% 94% 99%  Weight: 90.7 kg     Height: 5\' 10"  (1.778 m)      Medications  LORazepam (ATIVAN) injection 0-4 mg (1 mg Intravenous Given 09/12/20 0040)    Or  LORazepam (ATIVAN) tablet 0-4 mg ( Oral See Alternative 09/12/20 0040)  sucralfate (CARAFATE) 1 GM/10ML suspension 1 g (has no administration in time range)  potassium chloride SA (KLOR-CON) CR tablet 40 mEq (has no administration in time range)  ondansetron (ZOFRAN) injection 4 mg (has no administration in time range)  pantoprazole (PROTONIX) injection 40 mg (40 mg Intravenous Given 09/12/20 0031)  ondansetron (ZOFRAN) injection 4 mg (4 mg Intravenous Given 09/12/20 0030)  sodium chloride 0.9 % bolus 1,000 mL (1,000 mLs Intravenous New Bag/Given 09/12/20 0031)   4:03 AM Patient's lipase is normal.  I suspect his epigastric pain is due to alcoholic gastritis.  We will start him on Carafate and a PPI.  He was advised of his blood alcohol level.   PROCEDURES  Procedures   ED DIAGNOSES     ICD-10-CM   1. Acute alcoholic gastritis without hemorrhage  K29.20   2. Alcoholic intoxication with complication (HCC)  F10.929        Dameer Speiser, 11/12/20, MD 09/12/20 Jonny Ruiz

## 2020-09-12 NOTE — ED Notes (Signed)
Patient standing in hallway outside of room talking loudly, patient asked to step back into room and sit on bed to be hooked up to monitor This RN explained plan of care to patient Patient states, "I'm worried I have pancreatitis because I have abdominal pain, I don't feel good, what are you going to do to make me feel better? I will refuse a CT scan because getting them will give me cancer, if you try to give me one then I will leave. I'm an alcoholic and choosing to drink isn't my choice, do you understand alcoholism? I need to get better so I can help other alcoholics."

## 2020-10-07 ENCOUNTER — Ambulatory Visit: Payer: Self-pay | Admitting: *Deleted

## 2020-10-07 ENCOUNTER — Telehealth: Payer: Self-pay | Admitting: *Deleted

## 2020-10-07 NOTE — Patient Outreach (Signed)
[facility]) Care Management  10/07/2020  Nicolas [name] 23-Dec-1965 [id number]   CSW attempted a final outreach to pt today and was unable to reach. CSW will mail pt a closure letter.  [name], MSW, LCSW Clinical Social Worker  [facility] [employer] [phone]

## 2020-10-29 ENCOUNTER — Ambulatory Visit (INDEPENDENT_AMBULATORY_CARE_PROVIDER_SITE_OTHER): Payer: Self-pay | Admitting: Plastic Surgery

## 2020-10-29 ENCOUNTER — Other Ambulatory Visit: Payer: Self-pay

## 2020-10-29 ENCOUNTER — Encounter: Payer: Self-pay | Admitting: Plastic Surgery

## 2020-10-29 VITALS — BP 120/77 | HR 77

## 2020-10-29 DIAGNOSIS — Z411 Encounter for cosmetic surgery: Secondary | ICD-10-CM

## 2020-10-29 NOTE — Progress Notes (Signed)
Patient presents to discuss teeth clenching issues.  He said for several years he has severe teeth grinding and broken some of his teeth.  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  He does not recall the number of units but did have quite a bit of benefit from that.  He is interested in additional treatment.  We discussed the risks and benefits of Botox treatment for this issue and he elected to move forward.  I marked out a safe zone which was inferior to the line between the corner of the mouth and the earlobe and superior to the angle of the mandible.  I then had him clenched down to identify the area where the masseter was its largest and marked 2 locations in this area to inject.  I then drew up 32 units of Botox and distributed about 18 units in the right side and 14 units in the left side as he did have more hypertrophy on the right side than the left.  He tolerated this fine.  We will plan to see him back on an as-needed basis.  All of his questions were answered.
